# Patient Record
Sex: Female | Born: 1950
Health system: Southern US, Community
[De-identification: ages and names within clinical notes are randomized; demographics above are authoritative.]

## PROBLEM LIST (undated history)

## (undated) DIAGNOSIS — K149 Disease of tongue, unspecified: Secondary | ICD-10-CM

## (undated) DIAGNOSIS — K228 Other specified diseases of esophagus: Secondary | ICD-10-CM

## (undated) DIAGNOSIS — E278 Other specified disorders of adrenal gland: Secondary | ICD-10-CM

## (undated) DIAGNOSIS — E1140 Type 2 diabetes mellitus with diabetic neuropathy, unspecified: Secondary | ICD-10-CM

## (undated) DIAGNOSIS — M25512 Pain in left shoulder: Secondary | ICD-10-CM

## (undated) DIAGNOSIS — K2289 Other specified disease of esophagus: Secondary | ICD-10-CM

## (undated) DIAGNOSIS — F32A Depression, unspecified: Secondary | ICD-10-CM

## (undated) DIAGNOSIS — F329 Major depressive disorder, single episode, unspecified: Secondary | ICD-10-CM

## (undated) DIAGNOSIS — E785 Hyperlipidemia, unspecified: Secondary | ICD-10-CM

## (undated) DIAGNOSIS — I10 Essential (primary) hypertension: Secondary | ICD-10-CM

## (undated) DIAGNOSIS — R011 Cardiac murmur, unspecified: Secondary | ICD-10-CM

## (undated) DIAGNOSIS — K859 Acute pancreatitis without necrosis or infection, unspecified: Secondary | ICD-10-CM

## (undated) HISTORY — DX: Acute pancreatitis without necrosis or infection, unspecified: K85.90

## (undated) HISTORY — PX: CHOLECYSTECTOMY: SHX55

## (undated) HISTORY — PX: TUBAL LIGATION: SHX77

## (undated) HISTORY — PX: COLONOSCOPY: SHX174

## (undated) HISTORY — PX: OTHER SURGICAL HISTORY: SHX169

---

## 2004-04-23 ENCOUNTER — Ambulatory Visit: Payer: Self-pay | Admitting: Internal Medicine

## 2004-09-07 ENCOUNTER — Emergency Department (HOSPITAL_COMMUNITY): Admission: EM | Admit: 2004-09-07 | Discharge: 2004-09-07 | Payer: Self-pay | Admitting: Emergency Medicine

## 2006-02-22 ENCOUNTER — Ambulatory Visit: Payer: Self-pay

## 2006-04-21 ENCOUNTER — Encounter (HOSPITAL_COMMUNITY): Admission: RE | Admit: 2006-04-21 | Discharge: 2006-05-21 | Payer: Self-pay | Admitting: Unknown Physician Specialty

## 2006-04-29 ENCOUNTER — Ambulatory Visit: Payer: Self-pay | Admitting: Gastroenterology

## 2007-05-24 ENCOUNTER — Ambulatory Visit: Payer: Self-pay

## 2008-05-31 ENCOUNTER — Ambulatory Visit: Payer: Self-pay | Admitting: Obstetrics and Gynecology

## 2010-01-19 DIAGNOSIS — E278 Other specified disorders of adrenal gland: Secondary | ICD-10-CM

## 2010-01-19 HISTORY — DX: Other specified disorders of adrenal gland: E27.8

## 2010-09-27 ENCOUNTER — Emergency Department (HOSPITAL_COMMUNITY): Payer: BC Managed Care – PPO

## 2010-09-27 ENCOUNTER — Emergency Department (HOSPITAL_COMMUNITY)
Admission: EM | Admit: 2010-09-27 | Discharge: 2010-09-27 | Disposition: A | Discharge status: Home / Self Care | Payer: BC Managed Care – PPO | Attending: Emergency Medicine | Admitting: Emergency Medicine

## 2010-09-27 DIAGNOSIS — R1032 Left lower quadrant pain: Secondary | ICD-10-CM | POA: Insufficient documentation

## 2010-09-27 DIAGNOSIS — M545 Low back pain, unspecified: Secondary | ICD-10-CM | POA: Insufficient documentation

## 2010-09-27 DIAGNOSIS — R7402 Elevation of levels of lactic acid dehydrogenase (LDH): Secondary | ICD-10-CM | POA: Insufficient documentation

## 2010-09-27 DIAGNOSIS — E278 Other specified disorders of adrenal gland: Secondary | ICD-10-CM

## 2010-09-27 DIAGNOSIS — E279 Disorder of adrenal gland, unspecified: Secondary | ICD-10-CM | POA: Insufficient documentation

## 2010-09-27 DIAGNOSIS — R748 Abnormal levels of other serum enzymes: Secondary | ICD-10-CM

## 2010-09-27 DIAGNOSIS — E119 Type 2 diabetes mellitus without complications: Secondary | ICD-10-CM | POA: Insufficient documentation

## 2010-09-27 DIAGNOSIS — R7401 Elevation of levels of liver transaminase levels: Secondary | ICD-10-CM | POA: Insufficient documentation

## 2010-09-27 DIAGNOSIS — R1031 Right lower quadrant pain: Secondary | ICD-10-CM | POA: Insufficient documentation

## 2010-09-27 DIAGNOSIS — H538 Other visual disturbances: Secondary | ICD-10-CM | POA: Insufficient documentation

## 2010-09-27 LAB — DIFFERENTIAL
Basophils Absolute: 0 10*3/uL (ref 0.0–0.1)
Eosinophils Absolute: 0.1 10*3/uL (ref 0.0–0.7)
Eosinophils Relative: 2 % (ref 0–5)
Lymphocytes Relative: 47 % — ABNORMAL HIGH (ref 12–46)
Neutrophils Relative %: 46 % (ref 43–77)

## 2010-09-27 LAB — URINALYSIS, ROUTINE W REFLEX MICROSCOPIC
Bilirubin Urine: NEGATIVE
Glucose, UA: NEGATIVE mg/dL
Hgb urine dipstick: NEGATIVE
Nitrite: NEGATIVE
Specific Gravity, Urine: >1.03 — ABNORMAL HIGH (ref 1.005–1.030)
pH: 5.5 (ref 5.0–8.0)

## 2010-09-27 LAB — COMPREHENSIVE METABOLIC PANEL
ALT: 77 U/L — ABNORMAL HIGH (ref 0–35)
AST: 47 U/L — ABNORMAL HIGH (ref 0–37)
Albumin: 3.6 g/dL (ref 3.5–5.2)
Calcium: 10 mg/dL (ref 8.4–10.5)
Sodium: 134 mEq/L — ABNORMAL LOW (ref 135–145)
Total Protein: 6.8 g/dL (ref 6.0–8.3)

## 2010-09-27 LAB — CBC
MCH: 29.3 pg (ref 26.0–34.0)
MCV: 86.3 fL (ref 78.0–100.0)
Platelets: 323 10*3/uL (ref 150–400)
RDW: 12.5 % (ref 11.5–15.5)
WBC: 5.4 10*3/uL (ref 4.0–10.5)

## 2010-09-27 LAB — GLUCOSE, CAPILLARY

## 2010-09-27 MED ORDER — SODIUM CHLORIDE 0.9 % IV BOLUS (SEPSIS)
1000.0000 mL | Freq: Once | INTRAVENOUS | Status: AC
  Administered 2010-09-27: 1000 mL via INTRAVENOUS

## 2010-09-27 MED ORDER — ONDANSETRON HCL 4 MG PO TABS: 4.0000 mg | ORAL_TABLET | Freq: Four times a day (QID) | ORAL | Status: AC

## 2010-09-27 MED ORDER — SODIUM CHLORIDE 0.9 % IV SOLN: INTRAVENOUS | Status: DC

## 2010-09-27 MED ORDER — HYDROCODONE-ACETAMINOPHEN 5-325 MG PO TABS: 1.0000 | ORAL_TABLET | ORAL | Status: AC | PRN

## 2010-09-27 MED ORDER — HYDROMORPHONE HCL 1 MG/ML IJ SOLN
1.0000 mg | Freq: Once | INTRAMUSCULAR | Status: AC
  Administered 2010-09-27: 1 mg via INTRAVENOUS
  Filled 2010-09-27: qty 1

## 2010-09-27 MED ORDER — ONDANSETRON HCL 4 MG/2ML IJ SOLN
4.0000 mg | Freq: Once | INTRAMUSCULAR | Status: AC
  Administered 2010-09-27: 4 mg via INTRAVENOUS
  Filled 2010-09-27: qty 2

## 2010-09-27 NOTE — ED Notes (Signed)
Pt reports lower back and lower abd pain since yesterday.  Denies any n/v/d.  Pt says stools have been runny since taking metformin.  Denies any pain or burning with urination.  Also c/ o blurred vision since last Wednesday.  Reports pcp changed some of her diabetic medication because hgb a1c was elevated.

## 2010-09-27 NOTE — ED Notes (Signed)
Pt reports lower back pain, blurry vision, and lower abd pain.  Pt denies any n/v/d. Pt reports that the last time she went to her pcp, he told her that her A!C was elevated.  Pt feels like this is why she is experiencing these symptoms.

## 2010-09-27 NOTE — ED Provider Notes (Signed)
Scribed for Destiny Hutching, MD, the patient was seen in room APA01/APA01 . This chart was scribed by Ellie Lunch. This patient's care was started at 5:32 PM.   CSN: [id number] Arrival date & time: 09/27/2010  5:13 PM  Chief Complaint  Patient presents with  . Back Pain  . Abdominal Pain   HPI Destiny Hurley is a 60 y.o. female with a history of diabetes presents to the Emergency Department complaining of lower abdominal pain radiating to the lower back starting yesterday. Pain is described as "heavy" and like a cramp. Pt denies n/v/d, Pt states at her last PCP visit her AC1 was 9.2 and she was switched to new medicines. She believes her abdominal pain is related to this. Additionally PT complains of blurry vision.   Pt was seen at 17:40   Past Medical History  Diagnosis Date  . Diabetes mellitus     Past Surgical History  Procedure Date  . Cholecystectomy    MEDICATIONS:  Previous Medications   No medications on file     ALLERGIES:  Allergies as of 09/27/2010  . (No Known Allergies)    No family history on file.  History  Substance Use Topics  . Smoking status: Never Smoker   . Smokeless tobacco: Not on file  . Alcohol Use: No    Review of Systems  Eyes: Positive for visual disturbance (blurry vision).  Gastrointestinal: Positive for abdominal pain (lower). Negative for nausea, vomiting and diarrhea.  Musculoskeletal: Positive for back pain (lower).  All other systems reviewed and are negative.    Physical Exam  BP 135/78  Pulse 69  Temp(Src) 98.3 F (36.8 C) (Oral)  Resp 18  Ht 5\' 5"  (1.651 m)  Wt 217 lb (98.431 kg)  BMI 36.11 kg/m2  SpO2 99%  Physical Exam  Nursing note and vitals reviewed. Constitutional: She is oriented to person, place, and time. She appears well-developed and well-nourished.       Appears Obese  HENT:  Head: Normocephalic and atraumatic.  Eyes: Conjunctivae and EOM are normal. Pupils are equal, round, and reactive to light.  Neck:  Neck supple.  Cardiovascular: Normal rate and regular rhythm.   Pulmonary/Chest: Effort normal and breath sounds normal.  Abdominal: Soft. There is tenderness.        Minimal tenderness RLQ, LLQ.   Musculoskeletal: Normal range of motion. She exhibits tenderness.       Mininal tenderness lower back.   Neurological: She is alert and oriented to person, place, and time.  Skin: Skin is warm and dry.  Psychiatric: She has a normal mood and affect.    Procedures OTHER DATA REVIEWED: Nursing notes, vital signs, and past medical records reviewed.  DIAGNOSTIC STUDIES: Oxygen Saturation is 99% on room air, normal by my interpretation.    LABS / RADIOLOGY:  Results for orders placed during the hospital encounter of 09/27/10  CBC      Component Value Range   WBC 5.4  4.0 - 10.5 (K/uL)   RBC 4.61  3.87 - 5.11 (MIL/uL)   Hemoglobin 13.5  12.0 - 15.0 (g/dL)   HCT 56.2  13.0 - 86.5 (%)   MCV 86.3  78.0 - 100.0 (fL)   MCH 29.3  26.0 - 34.0 (pg)   MCHC 33.9  30.0 - 36.0 (g/dL)   RDW 78.4  69.6 - 29.5 (%)   Platelets 323  150 - 400 (K/uL)  DIFFERENTIAL      Component Value Range   Neutrophils Relative  46  43 - 77 (%)   Neutro Abs 2.5  1.7 - 7.7 (K/uL)   Lymphocytes Relative 47 (*) 12 - 46 (%)   Lymphs Abs 2.5  0.7 - 4.0 (K/uL)   Monocytes Relative 5  3 - 12 (%)   Monocytes Absolute 0.3  0.1 - 1.0 (K/uL)   Eosinophils Relative 2  0 - 5 (%)   Eosinophils Absolute 0.1  0.0 - 0.7 (K/uL)   Basophils Relative 0  0 - 1 (%)   Basophils Absolute 0.0  0.0 - 0.1 (K/uL)  COMPREHENSIVE METABOLIC PANEL      Component Value Range   Sodium 134 (*) 135 - 145 (mEq/L)   Potassium 4.0  3.5 - 5.1 (mEq/L)   Chloride 100  96 - 112 (mEq/L)   CO2 26  19 - 32 (mEq/L)   Glucose, Bld 203 (*) 70 - 99 (mg/dL)   BUN 11  6 - 23 (mg/dL)   Creatinine, Ser 1.61  0.50 - 1.10 (mg/dL)   Calcium 09.6  8.4 - 10.5 (mg/dL)   Total Protein 6.8  6.0 - 8.3 (g/dL)   Albumin 3.6  3.5 - 5.2 (g/dL)   AST 47 (*) 0 - 37 (U/L)    ALT 77 (*) 0 - 35 (U/L)   Alkaline Phosphatase 126 (*) 39 - 117 (U/L)   Total Bilirubin 0.2 (*) 0.3 - 1.2 (mg/dL)   GFR calc non Af Amer >60  >60 (mL/min)   GFR calc Af Amer >60  >60 (mL/min)  URINALYSIS, ROUTINE W REFLEX MICROSCOPIC      Component Value Range   Color, Urine YELLOW  YELLOW    Appearance CLEAR  CLEAR    Specific Gravity, Urine >1.030 (*) 1.005 - 1.030    pH 5.5  5.0 - 8.0    Glucose, UA NEGATIVE  NEGATIVE (mg/dL)   Hgb urine dipstick NEGATIVE  NEGATIVE    Bilirubin Urine NEGATIVE  NEGATIVE    Ketones, ur NEGATIVE  NEGATIVE (mg/dL)   Protein, ur NEGATIVE  NEGATIVE (mg/dL)   Urobilinogen, UA 0.2  0.0 - 1.0 (mg/dL)   Nitrite NEGATIVE  NEGATIVE    Leukocytes, UA NEGATIVE  NEGATIVE   GLUCOSE, CAPILLARY      Component Value Range   Glucose-Capillary 200 (*) 70 - 99 (mg/dL)   Comment 1 Notify RN     Ct Abdomen Pelvis W Contrast  09/27/2010  *RADIOLOGY REPORT*  Clinical Data: Right-sided cramping and abdominal pain.  Post cholecystectomy.  CT ABDOMEN AND PELVIS WITH CONTRAST  Technique:  Multidetector CT imaging of the abdomen and pelvis was performed following the standard protocol during bolus administration of intravenous contrast.  Contrast:  100 ml Omnipaque-300.  Comparison: None.  Findings: No extraluminal bowel inflammatory process. Specifically, no inflammation surrounds the appendix.  No free fluid or free intraperitoneal air.  Lung bases clear.  Diffuse fatty infiltration of enlarged liver which spans over 22.4 cm without focal hepatic lesion.  No hydronephrosis or focal renal mass.  Parapelvic cyst noted on the left.  No right adrenal lesion, splenic lesion or pancreatic lesion.  2 cm left adrenal lesion.  This cannot be confirmed as an adenoma. One may consider follow-up CT or MR with attention to the adrenal gland in 12 months.  Fatty containing peri umbilical hernia.  No abdominal aortic aneurysm.  Calcified uterine fibroids displacing adjacent structures. Noncontrast  filled views of the urinary bladder unremarkable.  No pelvic or abdominal adenopathy.  No bony destructive lesion.  IMPRESSION: Enlarged liver (spanning over 22.4 cm) which is diffusely fatty infiltrated.  2 cm left adrenal lesion.  Follow-up CT/MR with attention to the adrenal gland in 12 months may be considered for further delineation.  No bowel inflammatory process detected.  Uterine calcified fibroids.  Original Report Authenticated By: Fuller Canada, [name]   ED COURSE / COORDINATION OF CARE:  MDM:  Uncertain etiology.  Will order Labs, CT scan, urinalysis and pain management.  Discussed lab results with PT. Discussed fatty liver, liver chemicals elevated and enlarged adrenal glands. Recommend another scan in one year.    IMPRESSION: Diagnoses that have been ruled out:  Diagnoses that are still under consideration:  Final diagnoses:   MEDICATIONS GIVEN IN THE E.D.  Medications  ondansetron (ZOFRAN) injection 4 mg   HYDROmorphone (DILAUDID) injection 1 mg   sodium chloride 0.9 % bolus 1,000 mL   0.9 %  sodium chloride infusion     DISCHARGE MEDICATIONS: New Prescriptions   No medications on file    SCRIBE ATTESTATION: I personally performed the services described in this documentation, which was scribed in my presence. The recorded information has been reviewed and considered. Destiny Hutching, MD Recheck at 2300. Patient feeling much better. No acute abdomen.  Discussed elevated liver enzymes. Also discussed left adrenal mass. A she understands she needs repeat CT scan in one year.       Destiny Hutching, MD 09/27/10 [phone]

## 2010-09-27 NOTE — ED Notes (Signed)
Pt back from ct

## 2010-09-27 NOTE — ED Notes (Signed)
Pt asleep.

## 2010-10-08 ENCOUNTER — Encounter (HOSPITAL_COMMUNITY): Payer: Self-pay | Admitting: *Deleted

## 2010-10-08 ENCOUNTER — Emergency Department (HOSPITAL_COMMUNITY): Payer: BC Managed Care – PPO

## 2010-10-08 ENCOUNTER — Emergency Department (HOSPITAL_COMMUNITY)
Admission: EM | Admit: 2010-10-08 | Discharge: 2010-10-08 | Disposition: A | Discharge status: Home / Self Care | Payer: BC Managed Care – PPO | Attending: Emergency Medicine | Admitting: Emergency Medicine

## 2010-10-08 DIAGNOSIS — S99929A Unspecified injury of unspecified foot, initial encounter: Secondary | ICD-10-CM | POA: Insufficient documentation

## 2010-10-08 DIAGNOSIS — E119 Type 2 diabetes mellitus without complications: Secondary | ICD-10-CM | POA: Insufficient documentation

## 2010-10-08 DIAGNOSIS — Y92009 Unspecified place in unspecified non-institutional (private) residence as the place of occurrence of the external cause: Secondary | ICD-10-CM | POA: Insufficient documentation

## 2010-10-08 DIAGNOSIS — IMO0002 Reserved for concepts with insufficient information to code with codable children: Secondary | ICD-10-CM | POA: Insufficient documentation

## 2010-10-08 DIAGNOSIS — S8990XA Unspecified injury of unspecified lower leg, initial encounter: Secondary | ICD-10-CM | POA: Insufficient documentation

## 2010-10-08 DIAGNOSIS — S92309A Fracture of unspecified metatarsal bone(s), unspecified foot, initial encounter for closed fracture: Secondary | ICD-10-CM

## 2010-10-08 MED ORDER — HYDROCODONE-ACETAMINOPHEN 5-325 MG PO TABS
1.0000 | ORAL_TABLET | Freq: Once | ORAL | Status: AC
  Administered 2010-10-08: 1 via ORAL
  Filled 2010-10-08: qty 1

## 2010-10-08 MED ORDER — HYDROCODONE-ACETAMINOPHEN 5-325 MG PO TABS: ORAL_TABLET | ORAL | Status: AC

## 2010-10-08 NOTE — ED Notes (Signed)
Cam Walker applied to right foot.

## 2010-10-08 NOTE — ED Notes (Signed)
Pt a/ox4. resp even and unlabored. NAD at this time. D/C instructions reviewed with pt. Pt verbalized understanding. Pt ambulated with steady gate to lobby to wait for daughter.

## 2010-10-08 NOTE — ED Notes (Signed)
Right foot pain, dropped a lawnmower on her foot

## 2010-10-08 NOTE — ED Provider Notes (Signed)
History     CSN: [id number] Arrival date & time: 10/08/2010  6:25 PM   Chief Complaint  Patient presents with  . Leg Injury     (Include location/radiation/quality/duration/timing/severity/associated sxs/prior treatment) Patient is a 60 y.o. female presenting with foot injury. The history is provided by the patient.  Foot Injury  The incident occurred 6 to 12 hours ago. The incident occurred at home. The injury mechanism was a direct blow. The pain is present in the right foot. The quality of the pain is described as aching and throbbing. The pain is moderate. The pain has been constant since onset. Associated symptoms include inability to bear weight. Pertinent negatives include no numbness, no loss of motion, no muscle weakness, no loss of sensation and no tingling. She reports no foreign bodies present. The symptoms are aggravated by activity, bearing weight and palpation. She has tried nothing for the symptoms. The treatment provided no relief.     Past Medical History  Diagnosis Date  . Diabetes mellitus      Past Surgical History  Procedure Date  . Cholecystectomy     History reviewed. No pertinent family history.  History  Substance Use Topics  . Smoking status: Never Smoker   . Smokeless tobacco: Not on file  . Alcohol Use: No    OB History    Grav Para Term Preterm Abortions TAB SAB Ect Mult Living                  Review of Systems  Musculoskeletal: Positive for arthralgias. Negative for back pain and joint swelling.  Skin: Negative.   Neurological: Negative for tingling, weakness and numbness.  Hematological: Does not bruise/bleed easily.  All other systems reviewed and are negative.    Allergies  Review of patient's allergies indicates no known allergies.  Home Medications   Current Outpatient Rx  Name Route Sig Dispense Refill  . GLIMEPIRIDE 4 MG PO TABS Oral Take 4 mg by mouth 2 (two) times daily.      Marland Kitchen LINAGLIPTIN 5 MG PO TABS Oral Take 5  mg by mouth daily.      Marland Kitchen LISINOPRIL 10 MG PO TABS Oral Take 10 mg by mouth daily.      Marland Kitchen METFORMIN HCL 1000 MG PO TABS Oral Take 1,000 mg by mouth 2 (two) times daily with a meal.     . OMEPRAZOLE 20 MG PO CPDR Oral Take 20 mg by mouth daily as needed. For acid reflux    . GABAPENTIN 100 MG PO CAPS Oral Take 100 mg by mouth 2 (two) times daily.      Marland Kitchen HYDROCODONE-ACETAMINOPHEN 5-325 MG PO TABS Oral Take 1-2 tablets by mouth every 4 (four) hours as needed for pain. 20 tablet 0    Physical Exam    BP 144/106  Pulse 78  Temp(Src) 98.8 F (37.1 C) (Oral)  Resp 16  Ht 5\' 5"  (1.651 m)  Wt 215 lb (97.523 kg)  BMI 35.78 kg/m2  SpO2 100%  Physical Exam  Constitutional: She is oriented to person, place, and time. She appears well-developed and well-nourished. No distress.  HENT:  Head: Normocephalic and atraumatic.  Neck: Normal range of motion. Neck supple.  Cardiovascular: Normal rate, regular rhythm and normal heart sounds.   Pulmonary/Chest: Effort normal and breath sounds normal.  Musculoskeletal: She exhibits tenderness. She exhibits no edema.       Right ankle: Normal.       Right foot: She exhibits tenderness and  bony tenderness. She exhibits normal range of motion, no swelling, normal capillary refill, no crepitus, no deformity and no laceration.       Feet:  Neurological: She is alert and oriented to person, place, and time. She has normal reflexes. A cranial nerve deficit is present. She exhibits normal muscle tone. Coordination normal.  Skin: Skin is warm and dry.    ED Course  ORTHOPEDIC INJURY TREATMENT Date/Time: 10/08/2010 7:52 PM Performed by: Trisha Mangle, Kayline Sheer L. Authorized by: Geoffery Lyons Consent: Verbal consent obtained. Written consent not obtained. Risks and benefits: risks, benefits and alternatives were discussed Consent given by: patient Patient understanding: patient states understanding of the procedure being performed Patient consent: the patient's  understanding of the procedure matches consent given Procedure consent: procedure consent matches procedure scheduled Imaging studies: imaging studies available Patient identity confirmed: verbally with patient Time out: Immediately prior to procedure a "time out" was called to verify the correct patient, procedure, equipment, support staff and site/side marked as required. Injury location: foot Injury type: fracture Fracture type: third metatarsal Pre-procedure neurovascular assessment: neurovascularly intact Pre-procedure distal perfusion: normal Pre-procedure neurological function: normal Pre-procedure range of motion: normal Local anesthesia used: no Patient sedated: no Manipulation performed: no Immobilization: cam walker. Post-procedure neurovascular assessment: post-procedure neurovascularly intact Post-procedure distal perfusion: normal Post-procedure neurological function: normal Post-procedure range of motion: normal Patient tolerance: Patient tolerated the procedure well with no immediate complications.      Dg Foot Complete Right  10/08/2010  *RADIOLOGY REPORT*  Clinical Data: Right foot pain after crush injury.  RIGHT FOOT COMPLETE - 3+ VIEW  Comparison: None.  Findings: I think there is a nondisplaced fracture of the third metatarsal neck although this is not a definite finding.  Otherwise no acute fracture is evident.  There is no subluxation or dislocation.  IMPRESSION: Question nondisplaced fracture through the neck of the middle metatarsal.  Original Report Authenticated By: ERIC A. MANSELL, [name]         MDM    8:12 PM patient has tenderness to the distal portion of the third metatarsal of the right foot.  Imaging suggest a non-displaced fx at this site.  I have placed patient in a cam walker and she agrees to close orthopedic follow-up.  CR<2 sec, sensation intact.  Dp pulse is strong and equal bilaterally.      Destry Dauber L. [place], [place] 10/12/10 [phone]

## 2010-10-13 ENCOUNTER — Encounter: Payer: Self-pay | Admitting: Orthopedic Surgery

## 2010-10-13 ENCOUNTER — Ambulatory Visit (INDEPENDENT_AMBULATORY_CARE_PROVIDER_SITE_OTHER): Payer: BC Managed Care – PPO | Admitting: Orthopedic Surgery

## 2010-10-13 VITALS — BP 118/82 | Ht 65.0 in | Wt 215.0 lb

## 2010-10-13 DIAGNOSIS — S92309A Fracture of unspecified metatarsal bone(s), unspecified foot, initial encounter for closed fracture: Secondary | ICD-10-CM

## 2010-10-13 NOTE — Patient Instructions (Signed)
Wear hard sole shoe x 6 weeks

## 2010-10-13 NOTE — Progress Notes (Signed)
New patient.  Emergency room  This 60 year old diabetic patient fell on September 19 when she slipped off a platform and twisted her foot and ankle.  She was seen in the emergency room on the same day and x-rays show a third metatarsal fracture nondisplaced area she complains of mild discomfort which seems to come and go and is worse with walking better with rest.  Minimal pain at this time.  Review of systems  Review of systems blurred vision diarrhea frequency denies numbness or tingling.  The x-ray was done at the hospital and has been reviewed with the report.  Past Medical History  Diagnosis Date  . Diabetes mellitus     Past Surgical History  Procedure Date  . Cholecystectomy     .Physical Exam(12) GENERAL: normal development , Mesomorphic body habitus  CDV: pulses are normal   Skin: normal  Lymph: nodes were not palpable/normal  Psychiatric: awake, alert and oriented  Neuro: normal sensation  MSK She is ambulating with a Cam Walker but says it's really heavy 1Tenderness at the third metatarsal 2 No swelling of the foot 3 Muscle tone normal 4 Joints stable 5 Range of motion is normal  Assessment: Fractured third metatarsal    Plan: Switch over to a postop hard sole shoe, 6 weeks, x-ray in 6

## 2010-10-15 ENCOUNTER — Telehealth: Payer: Self-pay | Admitting: Radiology

## 2010-10-15 NOTE — Telephone Encounter (Signed)
Will do, thank you

## 2010-10-15 NOTE — Telephone Encounter (Signed)
Patient called about her FMLA papers and said not to let [facility] fax or mail them, she needed to pick them up per her work place instructed her to. If you need to talk with her she will be home after lunch.

## 2010-10-16 ENCOUNTER — Ambulatory Visit: Payer: BC Managed Care – PPO | Admitting: Orthopedic Surgery

## 2010-10-17 NOTE — ED Provider Notes (Signed)
Medical screening examination/treatment/procedure(s) were performed by non-physician practitioner and as supervising physician I was immediately available for consultation/collaboration.   Huda Petrey, MD 10/17/10 0510 

## 2010-11-04 ENCOUNTER — Encounter: Payer: Self-pay | Admitting: Orthopedic Surgery

## 2010-11-25 ENCOUNTER — Encounter: Payer: Self-pay | Admitting: Orthopedic Surgery

## 2010-11-25 ENCOUNTER — Ambulatory Visit (INDEPENDENT_AMBULATORY_CARE_PROVIDER_SITE_OTHER): Payer: BC Managed Care – PPO | Admitting: Orthopedic Surgery

## 2010-11-25 ENCOUNTER — Ambulatory Visit: Payer: BC Managed Care – PPO | Admitting: Orthopedic Surgery

## 2010-11-25 VITALS — BP 102/60 | Ht 65.0 in | Wt 222.0 lb

## 2010-11-25 DIAGNOSIS — S92909A Unspecified fracture of unspecified foot, initial encounter for closed fracture: Secondary | ICD-10-CM

## 2010-11-25 MED ORDER — HYDROCODONE-ACETAMINOPHEN 5-325 MG PO TABS
1.0000 | ORAL_TABLET | Freq: Four times a day (QID) | ORAL | Status: AC | PRN
Start: 2010-11-25 — End: 2010-12-05

## 2010-11-25 NOTE — Progress Notes (Signed)
Follow up visit   This 60 year old diabetic patient fell on September 19 when she slipped off a platform and twisted her foot and ankle. She was seen in the emergency room on the same day and x-rays show a third metatarsal fracture nondisplaced area she complains of mild discomfort which seems to come and go and is worse with walking better with rest.   Fracture care.  Metatarsal fracture. #3 LEFT foot.  Currently, and postop hard sole shoe.  Still has some tenderness at the fracture site.  X-ray show fracture to be healed.  Recommended wearing the shoe for 3 more days to make sure she is ready to go back to work and then when she is seeking to return to work note.  X-ray report 3 views, LEFT foot.  3rd metatarsal fracture at the metaphyseal area distally.  The fracture looks healed and normal alignment.  Healed 3rd metatarsal fracture

## 2010-11-25 NOTE — Patient Instructions (Signed)
Remove shoe   If pain reapply x 2 weeks

## 2011-06-08 ENCOUNTER — Emergency Department (HOSPITAL_COMMUNITY)
Admission: EM | Admit: 2011-06-08 | Discharge: 2011-06-08 | Disposition: A | Discharge status: Home / Self Care | Payer: Self-pay | Source: Home / Self Care | Attending: Family Medicine | Admitting: Family Medicine

## 2011-06-08 ENCOUNTER — Encounter (HOSPITAL_COMMUNITY): Payer: Self-pay | Admitting: Emergency Medicine

## 2011-06-08 DIAGNOSIS — S8010XA Contusion of unspecified lower leg, initial encounter: Secondary | ICD-10-CM

## 2011-06-08 DIAGNOSIS — S8012XA Contusion of left lower leg, initial encounter: Secondary | ICD-10-CM
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
## 2011-06-08 NOTE — Discharge Instructions (Signed)
Heat to leg as needed for soreness, ok to work, advil or tylenol for soreness.

## 2011-07-14 ENCOUNTER — Ambulatory Visit: Payer: Self-pay | Admitting: Internal Medicine

## 2011-08-03 ENCOUNTER — Ambulatory Visit: Payer: Self-pay | Admitting: Internal Medicine

## 2012-02-17 ENCOUNTER — Ambulatory Visit: Payer: Self-pay | Admitting: Gastroenterology

## 2014-01-03 ENCOUNTER — Ambulatory Visit: Payer: Self-pay | Admitting: Internal Medicine

## 2014-06-20 DIAGNOSIS — E1140 Type 2 diabetes mellitus with diabetic neuropathy, unspecified: Secondary | ICD-10-CM | POA: Insufficient documentation

## 2014-12-25 ENCOUNTER — Emergency Department (HOSPITAL_COMMUNITY): Payer: BLUE CROSS/BLUE SHIELD

## 2014-12-25 ENCOUNTER — Encounter (HOSPITAL_COMMUNITY): Payer: Self-pay | Admitting: Emergency Medicine

## 2014-12-25 ENCOUNTER — Emergency Department (HOSPITAL_COMMUNITY)
Admission: EM | Admit: 2014-12-25 | Discharge: 2014-12-25 | Disposition: A | Discharge status: Home / Self Care | Payer: BLUE CROSS/BLUE SHIELD | Attending: Emergency Medicine | Admitting: Emergency Medicine

## 2014-12-25 DIAGNOSIS — X501XXA Overexertion from prolonged static or awkward postures, initial encounter: Secondary | ICD-10-CM | POA: Insufficient documentation

## 2014-12-25 DIAGNOSIS — Y9301 Activity, walking, marching and hiking: Secondary | ICD-10-CM | POA: Insufficient documentation

## 2014-12-25 DIAGNOSIS — Y998 Other external cause status: Secondary | ICD-10-CM | POA: Diagnosis not present

## 2014-12-25 DIAGNOSIS — Z7984 Long term (current) use of oral hypoglycemic drugs: Secondary | ICD-10-CM | POA: Diagnosis not present

## 2014-12-25 DIAGNOSIS — E119 Type 2 diabetes mellitus without complications: Secondary | ICD-10-CM | POA: Diagnosis not present

## 2014-12-25 DIAGNOSIS — Y9289 Other specified places as the place of occurrence of the external cause: Secondary | ICD-10-CM | POA: Diagnosis not present

## 2014-12-25 DIAGNOSIS — S93401A Sprain of unspecified ligament of right ankle, initial encounter: Secondary | ICD-10-CM | POA: Diagnosis not present

## 2014-12-25 DIAGNOSIS — E663 Overweight: Secondary | ICD-10-CM | POA: Diagnosis not present

## 2014-12-25 DIAGNOSIS — Z79899 Other long term (current) drug therapy: Secondary | ICD-10-CM | POA: Diagnosis not present

## 2014-12-25 DIAGNOSIS — S99921A Unspecified injury of right foot, initial encounter: Secondary | ICD-10-CM | POA: Diagnosis present

## 2014-12-25 MED ORDER — HYDROCODONE-ACETAMINOPHEN 5-325 MG PO TABS: 1.0000 | ORAL_TABLET | Freq: Four times a day (QID) | ORAL | Status: DC | PRN

## 2014-12-25 MED ORDER — HYDROCODONE-ACETAMINOPHEN 5-325 MG PO TABS
1.0000 | ORAL_TABLET | Freq: Once | ORAL | Status: DC
Start: 2014-12-25 — End: 2014-12-25

## 2014-12-25 MED ORDER — ACETAMINOPHEN 325 MG PO TABS
650.0000 mg | ORAL_TABLET | Freq: Once | ORAL | Status: AC
  Administered 2014-12-25: 650 mg via ORAL
  Filled 2014-12-25: qty 2

## 2014-12-25 NOTE — ED Provider Notes (Signed)
CSN: [id number]     Arrival date & time 12/25/14  0351 History   First MD Initiated Contact with Patient 12/25/14 [phone]     Chief Complaint  Patient presents with  . Foot Injury     (Consider location/radiation/quality/duration/timing/severity/associated sxs/prior Treatment) HPI  This is a 64 year old female with a history of diabetes who presents with right foot pain. Patient reports that she was walking to her mailbox earlier today when she stepped off a curb and rolled her right foot. She reports pain over the midfoot. Currently 10 out of 10. She has not taken anything for pain. Reports tingling in her fourth and fifth toes. Has been ambulatory. Denies other injury.  Past Medical History  Diagnosis Date  . Diabetes mellitus    Past Surgical History  Procedure Laterality Date  . Cholecystectomy     Family History  Problem Relation Age of Onset  . Heart disease    . Arthritis    . Lung disease    . Cancer    . Kidney disease    . Diabetes     Social History  Substance Use Topics  . Smoking status: Never Smoker   . Smokeless tobacco: Current User    Types: Chew  . Alcohol Use: No   OB History    No data available     Review of Systems  Musculoskeletal:       Right foot pain  Skin: Negative for wound.  All other systems reviewed and are negative.     Allergies  Review of patient's allergies indicates no known allergies.  Home Medications   Prior to Admission medications   Medication Sig Start Date End Date Taking? Authorizing Provider  canagliflozin (INVOKANA) 300 MG TABS tablet Take 300 mg by mouth daily. 12/11/14  Yes Historical Provider, MD  gabapentin (NEURONTIN) 100 MG capsule Take 100 mg by mouth 2 (two) times daily.     Yes Historical Provider, MD  glimepiride (AMARYL) 4 MG tablet Take 4 mg by mouth 2 (two) times daily.     Yes Historical Provider, MD  linagliptin (TRADJENTA) 5 MG TABS tablet Take 5 mg by mouth daily.     Yes Historical Provider, MD   lisinopril (PRINIVIL,ZESTRIL) 10 MG tablet Take 10 mg by mouth daily.     Yes Historical Provider, MD  lovastatin (MEVACOR) 20 MG tablet Take 20 mg by mouth daily. 10/10/14  Yes Historical Provider, MD  metFORMIN (GLUCOPHAGE) 1000 MG tablet Take 1,000 mg by mouth 2 (two) times daily with a meal.    Yes Historical Provider, MD  omeprazole (PRILOSEC) 20 MG capsule Take 20 mg by mouth daily as needed. For acid reflux   Yes Historical Provider, MD  HYDROcodone-acetaminophen (NORCO/VICODIN) 5-325 MG tablet Take 1-2 tablets by mouth every 6 (six) hours as needed for moderate pain. 12/25/14   Shon Baton, MD   BP 123/75 mmHg  Pulse 87  Temp(Src) 97.7 F (36.5 C) (Oral)  Resp 18  Ht  (1.651 m)  Wt 215 lb (97.523 kg)  BMI 35.78 kg/m2  SpO2 99% Physical Exam  Constitutional: She is oriented to person, place, and time. She appears well-developed and well-nourished.  Overweight  HENT:  Head: Normocephalic and atraumatic.  Cardiovascular: Normal rate and regular rhythm.   Pulmonary/Chest: Effort normal. No respiratory distress.  Musculoskeletal: She exhibits no edema.  No obvious deformities, no swelling noted, tenderness is just distal to the ankle but no midfoot tenderness, 2+ DP pulse, normal range  of motion of the toes, NV intact  Neurological: She is alert and oriented to person, place, and time.  Skin: Skin is warm and dry.  Psychiatric: She has a normal mood and affect.  Nursing note and vitals reviewed.   ED Course  Procedures (including critical care time) Labs Review Labs Reviewed - No data to display  Imaging Review Dg Ankle Complete Right  12/25/2014  CLINICAL DATA:  64 year old female with trauma to the right ankle. EXAM: RIGHT FOOT COMPLETE - 3+ VIEW; RIGHT ANKLE - COMPLETE 3+ VIEW COMPARISON:  None. FINDINGS: No acute fracture or dislocation. There is diffuse soft subcutaneous tissue stranding. No radiopaque foreign object identified. IMPRESSION: No fracture or  dislocation. Electronically Signed   By: Elgie CollardArash  Radparvar [name]   On: 12/25/2014 04:32   Dg Foot Complete Right  12/25/2014  CLINICAL DATA:  64 year old female with trauma to the right ankle. EXAM: RIGHT FOOT COMPLETE - 3+ VIEW; RIGHT ANKLE - COMPLETE 3+ VIEW COMPARISON:  None. FINDINGS: No acute fracture or dislocation. There is diffuse soft subcutaneous tissue stranding. No radiopaque foreign object identified. IMPRESSION: No fracture or dislocation. Electronically Signed   By: Elgie CollardArash  Radparvar [name]   On: 12/25/2014 04:32   I have personally reviewed and evaluated these images and lab results as part of my medical decision-making.   EKG Interpretation None      MDM   Final diagnoses:  Ankle sprain, right, initial encounter    Patient presents with right foot injury. Neurovascularly intact. No deformities. X-rays negative. Suspect sprain. Discussed with patient rest, ice, compression, and elevation. Pain medication at home.  After history, exam, and medical workup I feel the patient has been appropriately medically screened and is safe for discharge home. Pertinent diagnoses were discussed with the patient. Patient was given return precautions.     Shon Batonourtney F Rayssa Atha, MD 12/25/14 [phone]

## 2014-12-25 NOTE — ED Notes (Signed)
Pt c/o R foot pain, pt reports she twisted foot/ankle while walking to check her mail last night 2100. Pt has not taken taken any medication for injury

## 2014-12-25 NOTE — Discharge Instructions (Signed)
Ankle Sprain  An ankle sprain is an injury to the strong, fibrous tissues (ligaments) that hold the bones of your ankle joint together.   CAUSES  An ankle sprain is usually caused by a fall or by twisting your ankle. Ankle sprains most commonly occur when you step on the outer edge of your foot, and your ankle turns inward. People who participate in sports are more prone to these types of injuries.   SYMPTOMS    Pain in your ankle. The pain may be present at rest or only when you are trying to stand or walk.   Swelling.   Bruising. Bruising may develop immediately or within 1 to 2 days after your injury.   Difficulty standing or walking, particularly when turning corners or changing directions.  DIAGNOSIS   Your caregiver will ask you details about your injury and perform a physical exam of your ankle to determine if you have an ankle sprain. During the physical exam, your caregiver will press on and apply pressure to specific areas of your foot and ankle. Your caregiver will try to move your ankle in certain ways. An X-ray exam may be done to be sure a bone was not broken or a ligament did not separate from one of the bones in your ankle (avulsion fracture).   TREATMENT   Certain types of braces can help stabilize your ankle. Your caregiver can make a recommendation for this. Your caregiver may recommend the use of medicine for pain. If your sprain is severe, your caregiver may refer you to a surgeon who helps to restore function to parts of your skeletal system (orthopedist) or a physical therapist.  HOME CARE INSTRUCTIONS    Apply ice to your injury for 1-2 days or as directed by your caregiver. Applying ice helps to reduce inflammation and pain.    Put ice in a plastic bag.    Place a towel between your skin and the bag.    Leave the ice on for 15-20 minutes at a time, every 2 hours while you are awake.   Only take over-the-counter or prescription medicines for pain, discomfort, or fever as directed by  your caregiver.   Elevate your injured ankle above the level of your heart as much as possible for 2-3 days.   If your caregiver recommends crutches, use them as instructed. Gradually put weight on the affected ankle. Continue to use crutches or a cane until you can walk without feeling pain in your ankle.   If you have a plaster splint, wear the splint as directed by your caregiver. Do not rest it on anything harder than a pillow for the first 24 hours. Do not put weight on it. Do not get it wet. You may take it off to take a shower or bath.   You may have been given an elastic bandage to wear around your ankle to provide support. If the elastic bandage is too tight (you have numbness or tingling in your foot or your foot becomes cold and blue), adjust the bandage to make it comfortable.   If you have an air splint, you may blow more air into it or let air out to make it more comfortable. You may take your splint off at night and before taking a shower or bath. Wiggle your toes in the splint several times per day to decrease swelling.  SEEK MEDICAL CARE IF:    You have rapidly increasing bruising or swelling.   Your toes feel   extremely cold or you lose feeling in your foot.   Your pain is not relieved with medicine.  SEEK IMMEDIATE MEDICAL CARE IF:   Your toes are numb or blue.   You have severe pain that is increasing.  MAKE SURE YOU:    Understand these instructions.   Will watch your condition.   Will get help right away if you are not doing well or get worse.     This information is not intended to replace advice given to you by your health care provider. Make sure you discuss any questions you have with your health care provider.     Document Released: 01/05/2005 Document Revised: 01/26/2014 Document Reviewed: 01/17/2011  Elsevier Interactive Patient Education 2016 [employer]

## 2014-12-25 NOTE — ED Notes (Signed)
Pt drove her self tonight to the ED, order changed to Tylenol

## 2014-12-25 NOTE — ED Notes (Signed)
Ice wrap applied to foot/ankle, ice bag given to patient with instructions on RICE. Pt verbalized understanding not to drive or go to work while taking narcotics. Work not provided.

## 2015-06-16 ENCOUNTER — Ambulatory Visit (HOSPITAL_COMMUNITY)
Admission: EM | Admit: 2015-06-16 | Discharge: 2015-06-16 | Disposition: A | Discharge status: Home / Self Care | Payer: No Typology Code available for payment source | Attending: Emergency Medicine | Admitting: Emergency Medicine

## 2015-06-16 ENCOUNTER — Encounter (HOSPITAL_COMMUNITY): Payer: Self-pay | Admitting: *Deleted

## 2015-06-16 DIAGNOSIS — S161XXA Strain of muscle, fascia and tendon at neck level, initial encounter: Secondary | ICD-10-CM | POA: Diagnosis not present

## 2015-06-16 HISTORY — DX: Essential (primary) hypertension: I10

## 2015-06-16 HISTORY — DX: Hyperlipidemia, unspecified: E78.5

## 2015-06-16 MED ORDER — CYCLOBENZAPRINE HCL 5 MG PO TABS: 5.0000 mg | ORAL_TABLET | Freq: Three times a day (TID) | ORAL | Status: DC | PRN
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
## 2015-06-16 NOTE — Discharge Instructions (Signed)
You have strained the muscles in your neck. There is no sign of spinal injury. For today and tomorrow, apply ice for 10 minutes followed by heat for 10 minutes. Starting on Tuesday, you can just use heat. Take Tylenol or ibuprofen as needed for pain. Use the Flexeril at bedtime to help with muscle spasm. This medicine will make you sleepy. The worst should be over in the next 2 days, but this will likely take 1-2 weeks to fully resolve.
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
## 2015-10-07 ENCOUNTER — Other Ambulatory Visit: Payer: Self-pay | Admitting: Internal Medicine

## 2015-10-07 DIAGNOSIS — R748 Abnormal levels of other serum enzymes: Secondary | ICD-10-CM

## 2015-10-07 DIAGNOSIS — R1013 Epigastric pain: Secondary | ICD-10-CM

## 2015-10-14 ENCOUNTER — Ambulatory Visit
Admission: RE | Admit: 2015-10-14 | Discharge: 2015-10-14 | Disposition: A | Discharge status: Home / Self Care | Payer: BLUE CROSS/BLUE SHIELD | Source: Ambulatory Visit | Attending: Internal Medicine | Admitting: Internal Medicine

## 2015-10-14 DIAGNOSIS — K760 Fatty (change of) liver, not elsewhere classified: Secondary | ICD-10-CM | POA: Diagnosis not present

## 2015-10-14 DIAGNOSIS — Z9049 Acquired absence of other specified parts of digestive tract: Secondary | ICD-10-CM | POA: Diagnosis not present

## 2015-10-14 DIAGNOSIS — R748 Abnormal levels of other serum enzymes: Secondary | ICD-10-CM

## 2015-10-14 DIAGNOSIS — R1013 Epigastric pain: Secondary | ICD-10-CM

## 2016-01-14 ENCOUNTER — Other Ambulatory Visit: Payer: Self-pay | Admitting: Internal Medicine

## 2016-01-14 DIAGNOSIS — Z1231 Encounter for screening mammogram for malignant neoplasm of breast: Secondary | ICD-10-CM

## 2016-02-18 ENCOUNTER — Ambulatory Visit: Payer: BLUE CROSS/BLUE SHIELD | Attending: Internal Medicine

## 2016-05-11 ENCOUNTER — Ambulatory Visit
Admission: RE | Admit: 2016-05-11 | Discharge: 2016-05-11 | Disposition: A | Discharge status: Home / Self Care | Payer: BLUE CROSS/BLUE SHIELD | Source: Ambulatory Visit | Attending: Internal Medicine | Admitting: Internal Medicine

## 2016-05-11 DIAGNOSIS — Z1231 Encounter for screening mammogram for malignant neoplasm of breast: Secondary | ICD-10-CM | POA: Diagnosis not present

## 2016-11-06 LAB — HM DIABETES EYE EXAM

## 2016-11-27 ENCOUNTER — Encounter: Payer: Self-pay | Admitting: Podiatry

## 2016-11-27 ENCOUNTER — Ambulatory Visit (INDEPENDENT_AMBULATORY_CARE_PROVIDER_SITE_OTHER): Payer: BLUE CROSS/BLUE SHIELD | Admitting: Podiatry

## 2016-11-27 VITALS — BP 126/70 | HR 89 | Ht 65.0 in | Wt 193.0 lb

## 2016-11-27 DIAGNOSIS — G629 Polyneuropathy, unspecified: Secondary | ICD-10-CM | POA: Diagnosis not present

## 2016-11-27 DIAGNOSIS — E1165 Type 2 diabetes mellitus with hyperglycemia: Secondary | ICD-10-CM | POA: Diagnosis not present

## 2016-11-27 MED ORDER — GABAPENTIN 600 MG PO TABS: 600.0000 mg | ORAL_TABLET | Freq: Three times a day (TID) | ORAL | 2 refills | Status: DC

## 2016-11-27 NOTE — Progress Notes (Signed)
   Subjective:    Patient ID: Destiny Hurley, female    DOB: 03/29/1950, 66 y.o.   MRN: [id number]  HPI  Chief Complaint  Patient presents with  . Diabetes    last A1C 14  . Peripheral Neuropathy    numbness, tingling, burning, both feet   66 year old female presents the office today for concerns of burning, stinging, tingling to her feet mostly in her toes.  She states that this is been ongoing for some time and she is currently on gabapentin.  She is taking 600 mg in the morning and 750 mg at nighttime.  She states that does help but she continues to get the symptoms.  She denies any pain to her feet otherwise.  She tries to sleep it does wake her up.  She denies any swelling or redness.  She is diabetic her last A1c was 14.  She denies any open sores.  She denies any swelling or redness to her feet.  She has no other concerns today.    Review of Systems  Musculoskeletal: Positive for arthralgias and back pain.  All other systems reviewed and are negative.      Objective:   Physical Exam General: AAO x3, NAD  Dermatological: Skin is warm, dry and supple bilateral. Nails x 10 are well manicured; remaining integument appears unremarkable at this time. There are no open sores, no preulcerative lesions, no rash or signs of infection present.  Vascular: Dorsalis Pedis artery and Posterior Tibial artery pedal pulses are 2/4 bilateral with immedate capillary fill time. There is no pain with calf compression, swelling, warmth, erythema.   Neruologic: Sensation decreased systems Wednesday monofilament, decreased vibratory sensation.  Musculoskeletal: There is no area of pinpoint bony tenderness or pain to vibratory sensation.  Ankle, subtalar joint range of motion intact without any restrictions.  There is no overlying edema, erythema, increased warmth bilaterally.  Muscular strength 5/5 in all groups tested bilateral.      Assessment & Plan:  66 year old female with symptomatic  neuropathy, uncontrolled diabetes -Treatment options discussed including all alternatives, risks, and complications -Etiology of symptoms were discussed -At this time discussed her symptoms are likely more related to neuropathy.  We discussed increasing the gabapentin to 600 mg 3 times a day.  Also ordered topical compound cream through [facility] for neuropathy today to see if this will help. -She states that because she works second and third shift when she eats it is sporadic.  She has seen a nutritionist for this as well.  She states that her medications are recently been changed to hopefully her A1c will be lower. -Discussed daily foot inspection. -Follow-up in 2 months or sooner if any issues arise  Vivi BarrackMatthew R Arelia Volpe DPM

## 2016-11-27 NOTE — Patient Instructions (Signed)
Gabapentin capsules or tablets What is this medicine? GABAPENTIN (GA ba pen tin) is used to control partial seizures in adults with epilepsy. It is also used to treat certain types of nerve pain. This medicine may be used for other purposes; ask your health care provider or pharmacist if you have questions. COMMON BRAND NAME(S): Active-PAC with Gabapentin, Gabarone, Neurontin What should I tell my health care provider before I take this medicine? They need to know if you have any of these conditions: -kidney disease -suicidal thoughts, plans, or attempt; a previous suicide attempt by you or a family member -an unusual or allergic reaction to gabapentin, other medicines, foods, dyes, or preservatives -pregnant or trying to get pregnant -breast-feeding How should I use this medicine? Take this medicine by mouth with a glass of water. Follow the directions on the prescription label. You can take it with or without food. If it upsets your stomach, take it with food.Take your medicine at regular intervals. Do not take it more often than directed. Do not stop taking except on your doctor's advice. If you are directed to break the 600 or 800 mg tablets in half as part of your dose, the extra half tablet should be used for the next dose. If you have not used the extra half tablet within 28 days, it should be thrown away. A special MedGuide will be given to you by the pharmacist with each prescription and refill. Be sure to read this information carefully each time. Talk to your pediatrician regarding the use of this medicine in children. Special care may be needed. Overdosage: If you think you have taken too much of this medicine contact a poison control center or emergency room at once. NOTE: This medicine is only for you. Do not share this medicine with others. What if I miss a dose? If you miss a dose, take it as soon as you can. If it is almost time for your next dose, take only that dose. Do not  take double or extra doses. What may interact with this medicine? Do not take this medicine with any of the following medications: -other gabapentin products This medicine may also interact with the following medications: -alcohol -antacids -antihistamines for allergy, cough and cold -certain medicines for anxiety or sleep -certain medicines for depression or psychotic disturbances -homatropine; hydrocodone -naproxen -narcotic medicines (opiates) for pain -phenothiazines like chlorpromazine, mesoridazine, prochlorperazine, thioridazine This list may not describe all possible interactions. Give your health care provider a list of all the medicines, herbs, non-prescription drugs, or dietary supplements you use. Also tell them if you smoke, drink alcohol, or use illegal drugs. Some items may interact with your medicine. What should I watch for while using this medicine? Visit your doctor or health care professional for regular checks on your progress. You may want to keep a record at home of how you feel your condition is responding to treatment. You may want to share this information with your doctor or health care professional at each visit. You should contact your doctor or health care professional if your seizures get worse or if you have any new types of seizures. Do not stop taking this medicine or any of your seizure medicines unless instructed by your doctor or health care professional. Stopping your medicine suddenly can increase your seizures or their severity. Wear a medical identification bracelet or chain if you are taking this medicine for seizures, and carry a card that lists all your medications. You may get drowsy, dizzy,   or have blurred vision. Do not drive, use machinery, or do anything that needs mental alertness until you know how this medicine affects you. To reduce dizzy or fainting spells, do not sit or stand up quickly, especially if you are an older patient. Alcohol can  increase drowsiness and dizziness. Avoid alcoholic drinks. Your mouth may get dry. Chewing sugarless gum or sucking hard candy, and drinking plenty of water will help. The use of this medicine may increase the chance of suicidal thoughts or actions. Pay special attention to how you are responding while on this medicine. Any worsening of mood, or thoughts of suicide or dying should be reported to your health care professional right away. Women who become pregnant while using this medicine may enroll in the [place] Antiepileptic Drug Pregnancy Registry by calling 1-888-233-2334. This registry collects information about the safety of antiepileptic drug use during pregnancy. What side effects may I notice from receiving this medicine? Side effects that you should report to your doctor or health care professional as soon as possible: -allergic reactions like skin rash, itching or hives, swelling of the face, lips, or tongue -worsening of mood, thoughts or actions of suicide or dying Side effects that usually do not require medical attention (report to your doctor or health care professional if they continue or are bothersome): -constipation -difficulty walking or controlling muscle movements -dizziness -nausea -slurred speech -tiredness -tremors -weight gain This list may not describe all possible side effects. Call your doctor for medical advice about side effects. You may report side effects to FDA at 1-800-FDA-1088. Where should I keep my medicine? Keep out of reach of children. This medicine may cause accidental overdose and death if it taken by other adults, children, or pets. Mix any unused medicine with a substance like cat litter or coffee grounds. Then throw the medicine away in a sealed container like a sealed bag or a coffee can with a lid. Do not use the medicine after the expiration date. Store at room temperature between 15 and 30 degrees C (59 and 86 degrees F). NOTE: This  sheet is a summary. It may not cover all possible information. If you have questions about this medicine, talk to your doctor, pharmacist, or health care provider.  2018 Elsevier/Gold Standard (2013-03-03 15:26:50)  

## 2016-12-13 ENCOUNTER — Emergency Department (HOSPITAL_BASED_OUTPATIENT_CLINIC_OR_DEPARTMENT_OTHER): Payer: BLUE CROSS/BLUE SHIELD

## 2016-12-13 ENCOUNTER — Emergency Department (HOSPITAL_BASED_OUTPATIENT_CLINIC_OR_DEPARTMENT_OTHER)
Admission: EM | Admit: 2016-12-13 | Discharge: 2016-12-13 | Disposition: A | Discharge status: Home / Self Care | Payer: BLUE CROSS/BLUE SHIELD | Attending: Emergency Medicine | Admitting: Emergency Medicine

## 2016-12-13 ENCOUNTER — Encounter (HOSPITAL_BASED_OUTPATIENT_CLINIC_OR_DEPARTMENT_OTHER): Payer: Self-pay | Admitting: Emergency Medicine

## 2016-12-13 ENCOUNTER — Other Ambulatory Visit: Payer: Self-pay

## 2016-12-13 DIAGNOSIS — M79604 Pain in right leg: Secondary | ICD-10-CM | POA: Diagnosis present

## 2016-12-13 DIAGNOSIS — F17290 Nicotine dependence, other tobacco product, uncomplicated: Secondary | ICD-10-CM | POA: Diagnosis not present

## 2016-12-13 DIAGNOSIS — I10 Essential (primary) hypertension: Secondary | ICD-10-CM | POA: Insufficient documentation

## 2016-12-13 DIAGNOSIS — E119 Type 2 diabetes mellitus without complications: Secondary | ICD-10-CM | POA: Diagnosis not present

## 2016-12-13 MED ORDER — TRAMADOL HCL 50 MG PO TABS: 50.0000 mg | ORAL_TABLET | Freq: Four times a day (QID) | ORAL | 0 refills | Status: DC | PRN

## 2016-12-13 NOTE — Discharge Instructions (Signed)

## 2016-12-13 NOTE — ED Notes (Signed)
Pt waiting at nurses station for discharge paperwork.  Not wanting to wait for vs and to sign.

## 2016-12-13 NOTE — ED Triage Notes (Addendum)
R leg pain x 1 year. Seen by dr and dx with arthritis. Pt states it is worse today. Pt states at her last appt they increased the frequency of her gabapentin to 3 times daily. Pain persists.

## 2016-12-13 NOTE — ED Provider Notes (Signed)
Emergency Department Provider Note   I have reviewed the triage vital signs and the nursing notes.   HISTORY  Chief Complaint Leg Pain   HPI Destiny Hurley is a 66 y.o. female with PMH of DM, HTN, HLD, and chronic right leg pain presents to the emergency department for evaluation of.  She states the pain radiates from the right lateral hip down to the entire leg.  She is seen her primary care physician who has prescribed Tylenol, gabapentin, Motrin which she has been taking with little to no relief in symptoms.  He denies any back pain.  No groin numbness or urine incontinence/urgency.  No UTI symptoms.  Denies any recent injury.  She notes some swelling to the leg which is common for her.  She denies any chest pain or difficulty breathing. Pain is severe and worse with movement.    Past Medical History:  Diagnosis Date  . Diabetes mellitus   . Hyperlipemia   . Hypertension     Patient Active Problem List   Diagnosis Date Noted  . Metatarsal fracture 10/13/2010    Past Surgical History:  Procedure Laterality Date  . CHOLECYSTECTOMY      Current Outpatient Rx  . Order #: [id number] Class: Historical Med  . Order #: [id number] Class: Normal  . Order #: [id number] Class: Historical Med  . Order #: [id number] Class: Normal  . Order #: [id number] Class: Historical Med  . Order #: [id number] Class: Print  . Order #: [id number] Class: Historical Med  . Order #: [id number] Class: Historical Med  . Order #: [id number] Class: Historical Med  . Order #: [id number] Class: Print    Allergies Patient has no known allergies.  Family History  Problem Relation Age of Onset  . Heart disease Unknown   . Arthritis Unknown   . Lung disease Unknown   . Cancer Unknown   . Kidney disease Unknown   . Diabetes Unknown     Social History Social History   Tobacco Use  . Smoking status: Never Smoker  . Smokeless tobacco: Current User    Types: Chew  . Tobacco comment: Chews 1 pack per day    Substance Use Topics  . Alcohol use: No  . Drug use: No    Review of Systems  Constitutional: No fever/chills Eyes: No visual changes. ENT: No sore throat. Cardiovascular: Denies chest pain. Respiratory: Denies shortness of breath. Gastrointestinal: No abdominal pain.  No nausea, no vomiting.  No diarrhea.  No constipation. Genitourinary: Negative for dysuria. Musculoskeletal: Negative for back pain. Positive right leg/hip pain with mild swelling.  Skin: Negative for rash. Neurological: Negative for headaches, focal weakness or numbness.  10-point ROS otherwise negative.  ____________________________________________   PHYSICAL EXAM:  VITAL SIGNS: ED Triage Vitals  Enc Vitals Group     BP 12/13/16 1626 (!) 153/80     Pulse Rate 12/13/16 1626 83     Resp 12/13/16 1626 18     Temp 12/13/16 1626 98.2 F (36.8 C)     Temp Source 12/13/16 1626 Oral     SpO2 12/13/16 1626 100 %     Weight 12/13/16 1625 187 lb (84.8 kg)     Height 12/13/16 1625 5\' 5"  (1.651 m)     Pain Score 12/13/16 1625 10   Constitutional: Alert and oriented. Well appearing and in no acute distress. Eyes: Conjunctivae are normal.  Head: Atraumatic. Nose: No congestion/rhinnorhea. Mouth/Throat: Mucous membranes are moist.  Cardiovascular: Normal rate, regular rhythm. Good peripheral circulation. Grossly normal heart sounds.  Respiratory: Normal respiratory effort.  No retractions. Lungs CTAB. Gastrointestinal: Soft and nontender. No distention.  Musculoskeletal: Right leg discomfort with ROM with no joint swelling or redness. No focal tenderness. Mild swelling noted. No bruising or redness. Normal left leg ROM.  Neurologic:  Normal speech and language. No gross focal neurologic deficits are appreciated.  Skin:  Skin is warm, dry and intact. No rash noted.  ____________________________________________  RADIOLOGY  [place] Venous Img Lower Right (dvt Study)  Result Date: 12/13/2016 CLINICAL DATA:  Right  leg pain x1 year worse today. EXAM: RIGHT LOWER EXTREMITY VENOUS DOPPLER ULTRASOUND TECHNIQUE: Gray-scale sonography with graded compression, as well as color Doppler and duplex ultrasound were performed to evaluate the lower extremity deep venous systems from the level of the common femoral vein and including the common femoral, femoral, profunda femoral, popliteal and calf veins including the posterior tibial, peroneal and gastrocnemius veins when visible. The superficial great saphenous vein was also interrogated. Spectral Doppler was utilized to evaluate flow at rest and with distal augmentation maneuvers in the common femoral, femoral and popliteal veins. COMPARISON:  None. FINDINGS: Contralateral Common Femoral Vein: Respiratory phasicity is normal and symmetric with the symptomatic side. No evidence of thrombus. Normal compressibility. Common Femoral Vein: No evidence of thrombus. Normal compressibility, respiratory phasicity and response to augmentation. Saphenofemoral Junction: No evidence of thrombus. Normal compressibility and flow on color Doppler imaging. Profunda Femoral Vein: No evidence of thrombus. Normal compressibility and flow on color Doppler imaging. Femoral Vein: No evidence of thrombus. Normal compressibility, respiratory phasicity and response to augmentation. Popliteal Vein: No evidence of thrombus. Normal compressibility, respiratory phasicity and response to augmentation. Calf Veins: Nonocclusive partially compressible gastrocnemius vein. The posterior tibial and peroneal veins are patent with normal compressibility and flow on color Doppler imaging. Varicose veins are noted of the superior calf and distal fine. Superficial Great Saphenous Vein: No evidence of thrombus. Normal compressibility. Venous Reflux:  None. Other Findings:  None. IMPRESSION: No evidence of acute deep venous thrombosis. Partially compressible recannulized appearing gastrocnemius vein thrombosis consistent with  chronic thrombosis. Electronically Signed   By: Tollie Ethavid  Kwon [name]   On: 12/13/2016 19:35    ____________________________________________   PROCEDURES  Procedure(s) performed:   Procedures  NONE ____________________________________________   INITIAL IMPRESSION / ASSESSMENT AND PLAN / ED COURSE  Pertinent labs & imaging results that were available during my care of the patient were reviewed by me and considered in my medical decision making (see chart for details).  Patient presents emergency department for evaluation of symptoms of been ongoing for the past year and is been treated like a combination of either arthritis versus neuropathy.  Patient does have mild swelling of the right lower extremity.  In review of the records patient has not had an ultrasound to rule out under plan to perform that today and refer her back to her PCP for further pain management if negative.   US with no DVT. Chronic superficial thrombosis. Plan for pain control and PCP follow up for further pain mgmt.   At this time, I do not feel there is any life-threatening condition present. I have reviewed and discussed all results (EKG, imaging, lab, urine as appropriate), exam findings with patient. I have reviewed nursing notes and appropriate previous records.  I feel the patient is safe to be discharged home without further emergent workup. Discussed usual and customary return precautions. Patient and family (if present) verbalize understanding and are comfortable with this plan.  Patient will follow-up with  their primary care provider. If they do not have a primary care provider, information for follow-up has been provided to them. All questions have been answered. ____________________________________________  FINAL CLINICAL IMPRESSION(S) / ED DIAGNOSES  Final diagnoses:  Right leg pain     MEDICATIONS GIVEN DURING THIS VISIT:  None  NEW OUTPATIENT MEDICATIONS STARTED DURING THIS  VISIT:  Tramadol   Note:  This document was prepared using Dragon voice recognition software and may include unintentional dictation errors.  Alona Bene, MD Emergency Medicine    Long, Arlyss Repress, MD 12/14/16 1146

## 2017-02-05 ENCOUNTER — Other Ambulatory Visit: Payer: Self-pay | Admitting: Internal Medicine

## 2017-02-05 DIAGNOSIS — G608 Other hereditary and idiopathic neuropathies: Secondary | ICD-10-CM

## 2017-02-11 ENCOUNTER — Ambulatory Visit (HOSPITAL_COMMUNITY)
Admission: RE | Admit: 2017-02-11 | Discharge: 2017-02-11 | Disposition: A | Discharge status: Home / Self Care | Payer: BLUE CROSS/BLUE SHIELD | Source: Ambulatory Visit | Attending: Internal Medicine | Admitting: Internal Medicine

## 2017-02-11 DIAGNOSIS — M47816 Spondylosis without myelopathy or radiculopathy, lumbar region: Secondary | ICD-10-CM | POA: Diagnosis not present

## 2017-02-11 DIAGNOSIS — M5126 Other intervertebral disc displacement, lumbar region: Secondary | ICD-10-CM | POA: Insufficient documentation

## 2017-02-11 DIAGNOSIS — G608 Other hereditary and idiopathic neuropathies: Secondary | ICD-10-CM | POA: Insufficient documentation

## 2017-03-01 ENCOUNTER — Encounter: Payer: Self-pay | Admitting: Podiatry

## 2017-03-01 ENCOUNTER — Ambulatory Visit (INDEPENDENT_AMBULATORY_CARE_PROVIDER_SITE_OTHER): Payer: BLUE CROSS/BLUE SHIELD | Admitting: Podiatry

## 2017-03-01 DIAGNOSIS — L989 Disorder of the skin and subcutaneous tissue, unspecified: Secondary | ICD-10-CM | POA: Diagnosis not present

## 2017-03-01 DIAGNOSIS — L603 Nail dystrophy: Secondary | ICD-10-CM | POA: Diagnosis not present

## 2017-03-08 NOTE — Progress Notes (Signed)
Subjective: 67 year old female presents the office today for concerns of her right second toe nail becoming thick and discolored.  She denies any significant pain in the nail she denies any redness or drainage or any swelling.  She said no recent treatment for this.  She has no other concerns today.  Upon evaluation today she did have a mole on the arch of the right foot which did appear to have a callus along the area.  She states that this is not changed shape or size in the neck for several years. Denies any systemic complaints such as fevers, chills, nausea, vomiting. No acute changes since last appointment, and no other complaints at this time.   Objective: AAO x3, NAD DP/PT pulses palpable bilaterally, CRT less than 3 seconds Right second digit toenails dystrophic, discolored with yellow-brown discoloration.  There is no drainage or pus or swelling.  There is no clinical signs of infection noted to the toenail. Along the right arch of the foot is a well-circumscribed hyperpigmented lesion consistent with a mole however there is a callus on the area.  Did debride this area a small amount of bleeding did occur directly along the skin lesion.  I did send the shavings for a biopsy.  Discussed possible excision of the mole. No open lesions or pre-ulcerative lesions.  No pain with calf compression, swelling, warmth, erythema  Assessment: 67 year old female right second digit onychodystrophy; skin lesion right arch  Plan: -All treatment options discussed with the patient including all alternatives, risks, complications.  -I debrided the callus on the area of the skin lesion.  I was able to debride some of the hyperpigmented tissue as well.  [facility]. Specimen was given to Hadley PenLisa Cox, CMA. -Debrided the right second digit toenail without any complications or bleeding.  No signs of infection but continue to monitor.  Discussed treatment options with the nail thickening. -Top of  the biopsy or sooner if needed.  Call any questions or concerns. -Patient encouraged to call the office with any questions, concerns, change in symptoms.

## 2017-03-15 ENCOUNTER — Telehealth: Payer: Self-pay | Admitting: *Deleted

## 2017-03-15 NOTE — Telephone Encounter (Signed)
Please have her come in this week to discuss. Let her know that it is likely a mole but I want to take another look at it. I sent you a message from the result but please disregard that. Thanks.

## 2017-03-15 NOTE — Telephone Encounter (Signed)
Critical results scanned by S. Durant.

## 2017-03-16 NOTE — Telephone Encounter (Signed)
LVM for patient to return my call to discuss pathology results and schedule an appointment to see Dr Ardelle AntonWagoner this week or next week

## 2017-03-17 DIAGNOSIS — M519 Unspecified thoracic, thoracolumbar and lumbosacral intervertebral disc disorder: Secondary | ICD-10-CM | POA: Insufficient documentation

## 2017-03-17 NOTE — Telephone Encounter (Signed)
Attempted to call the patient to discuss [facility] results. No answer. Left voicemail to call back to discuss.

## 2017-03-19 ENCOUNTER — Telehealth: Payer: Self-pay | Admitting: Podiatry

## 2017-03-19 NOTE — Telephone Encounter (Signed)
Attempted to called patient again to discuss results of biopsy, no answer. Left voicemail to call back.

## 2017-03-21 ENCOUNTER — Other Ambulatory Visit: Payer: Self-pay | Admitting: Podiatry

## 2017-03-25 ENCOUNTER — Encounter: Payer: Self-pay | Admitting: Podiatry

## 2017-03-25 ENCOUNTER — Ambulatory Visit (INDEPENDENT_AMBULATORY_CARE_PROVIDER_SITE_OTHER): Payer: BLUE CROSS/BLUE SHIELD | Admitting: Podiatry

## 2017-03-25 DIAGNOSIS — L989 Disorder of the skin and subcutaneous tissue, unspecified: Secondary | ICD-10-CM | POA: Diagnosis not present

## 2017-03-25 MED ORDER — GABAPENTIN 800 MG PO TABS
800.0000 mg | ORAL_TABLET | Freq: Three times a day (TID) | ORAL | 0 refills | Status: DC
Start: 2017-03-25 — End: 2017-04-08

## 2017-03-25 NOTE — Patient Instructions (Signed)
Gabapentin capsules or tablets What is this medicine? GABAPENTIN (GA ba pen tin) is used to control partial seizures in adults with epilepsy. It is also used to treat certain types of nerve pain. This medicine may be used for other purposes; ask your health care provider or pharmacist if you have questions. COMMON BRAND NAME(S): Active-PAC with Gabapentin, Gabarone, Neurontin What should I tell my health care provider before I take this medicine? They need to know if you have any of these conditions: -kidney disease -suicidal thoughts, plans, or attempt; a previous suicide attempt by you or a family member -an unusual or allergic reaction to gabapentin, other medicines, foods, dyes, or preservatives -pregnant or trying to get pregnant -breast-feeding How should I use this medicine? Take this medicine by mouth with a glass of water. Follow the directions on the prescription label. You can take it with or without food. If it upsets your stomach, take it with food.Take your medicine at regular intervals. Do not take it more often than directed. Do not stop taking except on your doctor's advice. If you are directed to break the 600 or 800 mg tablets in half as part of your dose, the extra half tablet should be used for the next dose. If you have not used the extra half tablet within 28 days, it should be thrown away. A special MedGuide will be given to you by the pharmacist with each prescription and refill. Be sure to read this information carefully each time. Talk to your pediatrician regarding the use of this medicine in children. Special care may be needed. Overdosage: If you think you have taken too much of this medicine contact a poison control center or emergency room at once. NOTE: This medicine is only for you. Do not share this medicine with others. What if I miss a dose? If you miss a dose, take it as soon as you can. If it is almost time for your next dose, take only that dose. Do not  take double or extra doses. What may interact with this medicine? Do not take this medicine with any of the following medications: -other gabapentin products This medicine may also interact with the following medications: -alcohol -antacids -antihistamines for allergy, cough and cold -certain medicines for anxiety or sleep -certain medicines for depression or psychotic disturbances -homatropine; hydrocodone -naproxen -narcotic medicines (opiates) for pain -phenothiazines like chlorpromazine, mesoridazine, prochlorperazine, thioridazine This list may not describe all possible interactions. Give your health care provider a list of all the medicines, herbs, non-prescription drugs, or dietary supplements you use. Also tell them if you smoke, drink alcohol, or use illegal drugs. Some items may interact with your medicine. What should I watch for while using this medicine? Visit your doctor or health care professional for regular checks on your progress. You may want to keep a record at home of how you feel your condition is responding to treatment. You may want to share this information with your doctor or health care professional at each visit. You should contact your doctor or health care professional if your seizures get worse or if you have any new types of seizures. Do not stop taking this medicine or any of your seizure medicines unless instructed by your doctor or health care professional. Stopping your medicine suddenly can increase your seizures or their severity. Wear a medical identification bracelet or chain if you are taking this medicine for seizures, and carry a card that lists all your medications. You may get drowsy, dizzy,   or have blurred vision. Do not drive, use machinery, or do anything that needs mental alertness until you know how this medicine affects you. To reduce dizzy or fainting spells, do not sit or stand up quickly, especially if you are an older patient. Alcohol can  increase drowsiness and dizziness. Avoid alcoholic drinks. Your mouth may get dry. Chewing sugarless gum or sucking hard candy, and drinking plenty of water will help. The use of this medicine may increase the chance of suicidal thoughts or actions. Pay special attention to how you are responding while on this medicine. Any worsening of mood, or thoughts of suicide or dying should be reported to your health care professional right away. Women who become pregnant while using this medicine may enroll in the [place] Antiepileptic Drug Pregnancy Registry by calling 1-888-233-2334. This registry collects information about the safety of antiepileptic drug use during pregnancy. What side effects may I notice from receiving this medicine? Side effects that you should report to your doctor or health care professional as soon as possible: -allergic reactions like skin rash, itching or hives, swelling of the face, lips, or tongue -worsening of mood, thoughts or actions of suicide or dying Side effects that usually do not require medical attention (report to your doctor or health care professional if they continue or are bothersome): -constipation -difficulty walking or controlling muscle movements -dizziness -nausea -slurred speech -tiredness -tremors -weight gain This list may not describe all possible side effects. Call your doctor for medical advice about side effects. You may report side effects to FDA at 1-800-FDA-1088. Where should I keep my medicine? Keep out of reach of children. This medicine may cause accidental overdose and death if it taken by other adults, children, or pets. Mix any unused medicine with a substance like cat litter or coffee grounds. Then throw the medicine away in a sealed container like a sealed bag or a coffee can with a lid. Do not use the medicine after the expiration date. Store at room temperature between 15 and 30 degrees C (59 and 86 degrees F). NOTE: This  sheet is a summary. It may not cover all possible information. If you have questions about this medicine, talk to your doctor, pharmacist, or health care provider.  2018 Elsevier/Gold Standard (2013-03-03 15:26:50)  

## 2017-03-29 NOTE — Progress Notes (Signed)
Subjective: Ms. Gala Lewandowskyorain presents the office today for follow-up evaluation of a skin lesion to the right foot.  This is not last saw her last appointment however had a somewhat unusual appearance to have some callus formation on the area.  Send this for a biopsy she presents today for follow-up evaluation of this.  Upon questioning her today she states that the area has been there for several years and has been unchanged.  She does not feel that the area has gotten bigger change in shape or color.  She has not noticed any drainage from the area. Denies any systemic complaints such as fevers, chills, nausea, vomiting. No acute changes since last appointment, and no other complaints at this time.   Objective: AAO x3, NAD DP/PT pulses palpable bilaterally, CRT less than 3 seconds Along the medial aspect of the right foot the hyperpigmented lesion measuring approximately 0.3 x 0.3 centimeters.  It is uniform in color.  There is no opening or drainage coming from the area.  There is no pain. No open lesions or pre-ulcerative lesions.  No pain with calf compression, swelling, warmth, erythema      Assessment: Skin lesion right foot  Plan: -All treatment options discussed with the patient including all alternatives, risks, complications.  -I discussed nail biopsy results clinic gave her a copy of the results today.  It shows a combined nevus, blue and intradermal type with significant crush artifact present.  Given some of the staining that they performed it argues against a malignant lesion.  I discussed with her options at this point.  I discussed with her I would recommend removing the mole today however she declines this.  She wishes to continue to monitor.  If there is any changes she is to call the office if she agrees with this.  Vivi BarrackMatthew R Wagoner DPM  -Patient encouraged to call the office with any questions, concerns, change in symptoms.

## 2017-04-01 ENCOUNTER — Ambulatory Visit (INDEPENDENT_AMBULATORY_CARE_PROVIDER_SITE_OTHER): Payer: BLUE CROSS/BLUE SHIELD | Admitting: Internal Medicine

## 2017-04-01 ENCOUNTER — Telehealth: Payer: Self-pay

## 2017-04-01 ENCOUNTER — Encounter: Payer: Self-pay | Admitting: Internal Medicine

## 2017-04-01 VITALS — BP 127/71 | HR 77 | Temp 98.0°F | Ht 65.0 in | Wt 180.3 lb

## 2017-04-01 DIAGNOSIS — Z800 Family history of malignant neoplasm of digestive organs: Secondary | ICD-10-CM

## 2017-04-01 DIAGNOSIS — Z79899 Other long term (current) drug therapy: Secondary | ICD-10-CM

## 2017-04-01 DIAGNOSIS — M6289 Other specified disorders of muscle: Secondary | ICD-10-CM | POA: Insufficient documentation

## 2017-04-01 DIAGNOSIS — Z794 Long term (current) use of insulin: Secondary | ICD-10-CM | POA: Diagnosis not present

## 2017-04-01 DIAGNOSIS — F17220 Nicotine dependence, chewing tobacco, uncomplicated: Secondary | ICD-10-CM | POA: Diagnosis not present

## 2017-04-01 DIAGNOSIS — E1140 Type 2 diabetes mellitus with diabetic neuropathy, unspecified: Secondary | ICD-10-CM | POA: Diagnosis not present

## 2017-04-01 DIAGNOSIS — I10 Essential (primary) hypertension: Secondary | ICD-10-CM | POA: Diagnosis not present

## 2017-04-01 DIAGNOSIS — M6281 Muscle weakness (generalized): Secondary | ICD-10-CM

## 2017-04-01 DIAGNOSIS — K529 Noninfective gastroenteritis and colitis, unspecified: Secondary | ICD-10-CM

## 2017-04-01 DIAGNOSIS — Z833 Family history of diabetes mellitus: Secondary | ICD-10-CM | POA: Diagnosis not present

## 2017-04-01 LAB — GLUCOSE, CAPILLARY: Glucose-Capillary: 311 mg/dL — ABNORMAL HIGH (ref 65–99)

## 2017-04-01 MED ORDER — LISINOPRIL 10 MG PO TABS: 10.0000 mg | ORAL_TABLET | Freq: Every day | ORAL | 2 refills | Status: DC

## 2017-04-01 MED ORDER — INSULIN PEN NEEDLE 32G X 4 MM MISC: 10.0000 [IU] | Freq: Every day | 3 refills | Status: DC

## 2017-04-01 MED ORDER — INSULIN GLARGINE 100 UNITS/ML SOLOSTAR PEN: 10.0000 [IU] | PEN_INJECTOR | Freq: Every day | SUBCUTANEOUS | 11 refills | Status: DC

## 2017-04-01 NOTE — Patient Instructions (Addendum)
Thank you for visiting clinic today. As we discussed that your blood sugar is high and we need to control it well to help you with your thigh pain. I am making changes in your insulin-giving you a new prescription of Lantus 10 units at bedtime. We are also giving you a continuous glucose monitor-he will follow-up in 1 week for download, please follow the instructions given with it. We will make necessity changes to your medication according to your blood sugar results. I am also checking some lab work to further evaluate your thigh pain. I am also referring you to see neurology for your nerve testing. As you told me that you cannot take gabapentin 600 mg 3 times a day, you can try taking 2 tablets of 600 mg at bedtime and 1 in the morning, if you continue to experience pain you can increase your morning dose to 2 tablets too.  Please let us know if that will help with your pain. Your blood pressure looks okay-continue taking lisinopril 10 mg daily. Follow-up in 1 week for your monitor reading.

## 2017-04-01 NOTE — Progress Notes (Signed)
Freestyle Libre Pro CGM sensor placed and started. Patient was educated about wearing sensor, keeping food, activity and medication log and when to call office. Follow up was arranged with the patient. LOT [id number], SN [id number], Exp 08/18/17

## 2017-04-01 NOTE — Assessment & Plan Note (Signed)
She is having chronic intermittent diarrhea for many years. She does not think that it is related to Metformin as her symptoms never changed when Metformin was stopped in the past. She has no other red flag symptoms. She is due for colonoscopy.  Referral for gastroenterology was provided for colonoscopy and for her chronic diarrhea.

## 2017-04-01 NOTE — Assessment & Plan Note (Signed)
She has uncontrolled diabetes.  She is unable to check her blood sugar and give her Humalog according to sliding scale because of her job schedule.  She was asking for a once daily dose. She was also given a prescription of gabapentin 600 mg 3 times a day, there was one prescription of gabapentin 800 mg 3 times a day in the chart, but patient was not aware of that.  She is just taking gabapentin 600 mg twice daily because of convenience and her work schedule.  Gabapentin was helping with her glove and stocking neuropathy but not with her thigh pain. -She was advised to take 2 pills of 600 mg at bedtime and continue taking 1 pill of 600 mg in the morning, if she does not noticed any change in her pain she can increase the morning dose to 1200 mg too.  If she does not respond well to increase in the dose of gabapentin, we can try getting her approved for Lyrica.  She was provided with a CGM today which will help with better control of her diabetes. She was started on Lantus 10 units at bedtime. She was advised to continue Invokana and Metformin at this time. We will reevaluate and make necessity changes if needed during next follow-up visit. She will follow-up in 1 week for CGM download.

## 2017-04-01 NOTE — Progress Notes (Signed)
CC: Bilateral thigh pain and to establish care.  HPI:  Destiny Hurley is a 67 y.o. with past medical history significant for hypertension and diabetes came to our clinic to establish care with [place].  She was seen at [facility] with [facility] for many years, apparently developed  pain in her thighs, mostly posterior and hamstring region, started on her right thigh now it is bilateral, associated with muscle wasting, weakness and weight loss since October 2018.  Denies any back pain.  She was provided with gabapentin with no relief, according to chart review she was given given Lyrica but her insurance did not approve.  Now she is having difficulty with standing from sitting position.  She denies any tingling or numbness.  She do have glove and stocking presentation of diabetic neuropathy and gabapentin does help with those symptoms but not with her thigh pain.  Patient has uncontrolled diabetes, her last A1c done in March 09, 2017 was 11.2, although improved from his previous one of about 22.  Currently she is on Invokana 300 mg daily, Metformin 1000 mg twice daily and recently started on Humalog with sliding scale, patient does not check her blood sugar multiple times due to her job schedule and just take 4-5 units of Humalog once or twice a day.  She was seeing an endocrinologist at [facility] clinic, stating that her endocrinologist is leaving that practice and she do not want to go back to that clinic.  She did bring her glucometer which shows an average glucose reading of 213 with multiple spikes close to 260.  Patient does not watch her diet and drink a lot of sweet tea.  She denies any increased urinary frequency or burning micturition. She was given Trulicity in the past which causes nausea and vomiting.  She was also complaining of chronic intermittent diarrhea, according to chart she was referred to gastroenterologist but patient denied.  She had and family history of colon cancer in  her mother and her last colonoscopy was in 2014 with recommendation for follow-up in 5 years.  Currently her diarrhea is better.  There is no association with any kind of food.  She denies any associated abdominal cramp, it mostly occurs, in the morning and resolved after having 3-4 episodes of loose stool.  Denies any melena or hematochezia.  She denies any change in her appetite.  She denies any nausea or vomiting.  Family history.  A lot of family members with diabetes, mother died of colon cancer at the age of 94.  Social history.  She chew tobacco, recently stopped for a few months and then restarted.  Denies any alcohol or illicit drug use.  Past Medical History:  Diagnosis Date  . Diabetes mellitus   . Hyperlipemia   . Hypertension    Review of Systems: Negative except mentioned in HPI  Physical Exam:  Vitals:   04/01/17 1027  BP: 127/71  Pulse: 77  Temp: 98 F (36.7 C)  TempSrc: Oral  SpO2: 100%  Weight: 180 lb 4.8 oz (81.8 kg)  Height: 5\' 5"  (1.651 m)    General: Vital signs reviewed.  Patient is well-developed and well-nourished, in no acute distress and cooperative with exam.  Head: Normocephalic and atraumatic. Eyes: EOMI, conjunctivae normal, no scleral icterus.  Neck: Supple, trachea midline, normal ROM, no JVD, masses, thyromegaly, or carotid bruit present.  Cardiovascular: RRR, S1 normal, S2 normal, no murmurs, gallops, or rubs. Pulmonary/Chest: Clear to auscultation bilaterally, no wheezes, rales, or rhonchi.  Abdominal: Soft, non-tender, non-distended, BS +, no masses, organomegaly, or guarding present.  Musculoskeletal: No joint deformities, erythema, or stiffness, ROM full and nontender. Extremities: No lower extremity edema bilaterally,  pulses symmetric and intact bilaterally. No cyanosis or clubbing. Neurological: A&O x3, cranial nerve II-XII are grossly intact, mild  muscle wasting in her thighs, strength 4/ 5 in thighs rest 5/5 at lower legs and upper  extremities bilaterally, sensory intact to light touch bilaterally.  Skin: Warm, dry and intact. No rashes or erythema. Psychiatric: Normal mood and affect. speech and behavior is normal. Cognition and memory are normal.  Assessment & Plan:   See Encounters Tab for problem based charting.  Patient discussed with Dr. Sandre Kittyaines.

## 2017-04-01 NOTE — Assessment & Plan Note (Addendum)
Her symptoms of hamstring pain, along with proximal muscle wasting and weakness with weight loss are more consistent with diabetic amyotrophy. Unfortunately there is not much treatment available at this time. Occasionally high-dose steroid can be tried but we will avoid at this time because of her uncontrolled diabetes.  We will see if the increase in dose of gabapentin will help. If gabapentin does not help we can try switching her to Lyrica. We will check ESR, CRP, CK and HIV today. She was provided with referral to neurology for nerve conduction studies.  Addendum.  She is having elevated CRP at 24, ESR and CK within normal limit and HIV nonreactive.

## 2017-04-01 NOTE — Assessment & Plan Note (Signed)
BP Readings from Last 3 Encounters:  04/01/17 127/71  12/13/16 (!) 153/80  11/27/16 126/70   She was normotensive today.  Continue lisinopril 10 mg daily.

## 2017-04-02 LAB — SEDIMENTATION RATE: Sed Rate: 14 mm/hr (ref 0–40)

## 2017-04-02 LAB — HIV ANTIBODY (ROUTINE TESTING W REFLEX): HIV Screen 4th Generation wRfx: NONREACTIVE

## 2017-04-02 LAB — CK: Total CK: 78 U/L (ref 24–173)

## 2017-04-02 LAB — C-REACTIVE PROTEIN: CRP: 24.5 mg/L — ABNORMAL HIGH (ref 0.0–4.9)

## 2017-04-02 NOTE — Progress Notes (Signed)
Internal Medicine Clinic Attending  Case discussed with Dr. Amin  at the time of the visit.  We reviewed the resident's history and exam and pertinent patient test results.  I agree with the assessment, diagnosis, and plan of care documented in the resident's note.  Alexander N Raines, MD   

## 2017-04-08 ENCOUNTER — Encounter: Payer: Self-pay | Admitting: Internal Medicine

## 2017-04-08 ENCOUNTER — Ambulatory Visit (INDEPENDENT_AMBULATORY_CARE_PROVIDER_SITE_OTHER): Payer: BLUE CROSS/BLUE SHIELD | Admitting: Internal Medicine

## 2017-04-08 ENCOUNTER — Other Ambulatory Visit: Payer: Self-pay

## 2017-04-08 VITALS — BP 135/75 | HR 90 | Temp 98.5°F | Ht 65.0 in | Wt 174.2 lb

## 2017-04-08 DIAGNOSIS — E1140 Type 2 diabetes mellitus with diabetic neuropathy, unspecified: Secondary | ICD-10-CM | POA: Diagnosis not present

## 2017-04-08 DIAGNOSIS — Z6828 Body mass index (BMI) 28.0-28.9, adult: Secondary | ICD-10-CM

## 2017-04-08 DIAGNOSIS — I10 Essential (primary) hypertension: Secondary | ICD-10-CM

## 2017-04-08 DIAGNOSIS — Z794 Long term (current) use of insulin: Secondary | ICD-10-CM | POA: Diagnosis not present

## 2017-04-08 DIAGNOSIS — M6281 Muscle weakness (generalized): Secondary | ICD-10-CM

## 2017-04-08 DIAGNOSIS — M6289 Other specified disorders of muscle: Secondary | ICD-10-CM

## 2017-04-08 DIAGNOSIS — M62551 Muscle wasting and atrophy, not elsewhere classified, right thigh: Secondary | ICD-10-CM | POA: Diagnosis not present

## 2017-04-08 DIAGNOSIS — R634 Abnormal weight loss: Secondary | ICD-10-CM

## 2017-04-08 DIAGNOSIS — Z79899 Other long term (current) drug therapy: Secondary | ICD-10-CM | POA: Diagnosis not present

## 2017-04-08 DIAGNOSIS — M79651 Pain in right thigh: Secondary | ICD-10-CM | POA: Diagnosis not present

## 2017-04-08 DIAGNOSIS — K529 Noninfective gastroenteritis and colitis, unspecified: Secondary | ICD-10-CM | POA: Diagnosis not present

## 2017-04-08 DIAGNOSIS — M62552 Muscle wasting and atrophy, not elsewhere classified, left thigh: Secondary | ICD-10-CM

## 2017-04-08 DIAGNOSIS — M79652 Pain in left thigh: Secondary | ICD-10-CM

## 2017-04-08 MED ORDER — GABAPENTIN 600 MG PO TABS: 1200.0000 mg | ORAL_TABLET | Freq: Two times a day (BID) | ORAL | 5 refills | Status: DC

## 2017-04-08 MED ORDER — INSULIN GLARGINE 100 UNITS/ML SOLOSTAR PEN: 20.0000 [IU] | PEN_INJECTOR | Freq: Every day | SUBCUTANEOUS | 11 refills | Status: DC

## 2017-04-08 MED ORDER — DULOXETINE HCL 30 MG PO CPEP: 30.0000 mg | ORAL_CAPSULE | Freq: Two times a day (BID) | ORAL | 2 refills | Status: DC

## 2017-04-08 NOTE — Assessment & Plan Note (Signed)
She came today for her first download of CGM. She had an average glucose of 250 mg/dl, 09%80% remained above target of 180 and 20% within target below 180, no episodes of any hypoglycemia.  Her blood sugar remained high during the later part of the night too, there is mild decrease around 9 AM most of the days.  She is currently taking Lantus 10 units at bedtime along with Invokana 300 mg daily and Metformin 1000 mg twice daily.  -Increase Lantus to 20 units at bedtime. -She will follow-up next week for second download.

## 2017-04-08 NOTE — Assessment & Plan Note (Signed)
She continued to experiencing thigh muscle wasting and weakness. There is some improvement in her pain after increasing gabapentin 1200 mg twice daily, and never resolved completely. She continued to experience weight loss.  -Continue gabapentin 1200 mg twice daily. -Add Cymbalta 30 mg daily for 1 week then increase it to 30 mg twice daily. -She was asked to make her appointment with neurology for nerve conduction studies, apparently they called her twice and she missed the call and never called them back.  [facility].

## 2017-04-08 NOTE — Assessment & Plan Note (Signed)
BP Readings from Last 3 Encounters:  04/08/17 135/75  04/01/17 127/71  12/13/16 (!) 153/80   Blood pressure was within goal.  Continue lisinopril 10 mg daily.

## 2017-04-08 NOTE — Patient Instructions (Addendum)
Thank you for visiting clinic today. As we discussed your blood sugar remained quite high all the time, I am increasing your Lantus to 20 units at bedtime. I am also adding another medication called Cymbalta for your leg pain, you will take it 30 mg daily for 1 week and then increase to 30 mg twice daily. Please call and make an appointment with gastroenterology for your diarrhea and neurology for your nerve studies. Please follow-up in 1 week for second download of your monitor.  FOLLOW-UP INSTRUCTIONS When: 1 week For: Second download of CGM What to bring: Your food log.

## 2017-04-08 NOTE — Assessment & Plan Note (Signed)
Most likely multifactorial, she might been catabolic state with depletion of her insulin stores.  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  She lost about 6 pounds in 1 week, her weight last week was 180 pound and today it was 174 pound.  -Increase Lantus to 20 units for better control of her diabetes and to give her some insulin.  She might need higher doses. -She had GI appointment on April 15, 2017 to evaluate diarrhea. -She was asked to call [facility] to make an appointment for nerve conduction studies as soon as possible. -Continue monitoring.

## 2017-04-08 NOTE — Progress Notes (Signed)
   CC: For first download of CGM at 1 week.  HPI:  Destiny Hurley is a 67 y.o. with past medical history as listed below came to the clinic for her first week of CGM download.  Patient established care last week at our clinic, she had uncontrolled diabetes with last A1c done in February 2019 was 11.2, she was also complaining of weight loss, thigh muscle wasting and pain in her both thighs.  She was provided with a CGM for better control of her blood sugar.  She continued to experience diarrhea, it got worse last night after eating spaghetti with cheese, she had 4 loose bowel movements overnight.  She also continued to lose weight.  Please see assessment and plan for her chronic conditions.  Past Medical History:  Diagnosis Date  . Diabetes mellitus   . Hyperlipemia   . Hypertension    Review of Systems: Negative except mentioned in HPI.  Physical Exam:  Vitals:   04/08/17 1324  BP: 135/75  Pulse: 90  Temp: 98.5 F (36.9 C)  TempSrc: Oral  SpO2: 100%  Weight: 174 lb 3.2 oz (79 kg)  Height: 5\' 5"  (1.651 m)    General: Vital signs reviewed.  Patient is well-developed and well-nourished, in no acute distress and cooperative with exam.  Head: Normocephalic and atraumatic. Eyes: EOMI, conjunctivae normal, no scleral icterus.  Cardiovascular: RRR, S1 normal, S2 normal, no murmurs, gallops, or rubs. Pulmonary/Chest: Clear to auscultation bilaterally, no wheezes, rales, or rhonchi. Abdominal: Soft, non-tender, non-distended, BS +, no masses, organomegaly, or guarding present.  Extremities: No lower extremity edema bilaterally,  pulses symmetric and intact bilaterally. No cyanosis or clubbing. Skin: Warm, dry and intact. No rashes or erythema. Psychiatric: Normal mood and affect. speech and behavior is normal. Cognition and memory are normal.  Assessment & Plan:   See Encounters Tab for problem based charting.  Patient discussed with Dr. Oswaldo DoneVincent.

## 2017-04-08 NOTE — Assessment & Plan Note (Signed)
She continued to experience diarrhea, got worse last night after eating spaghetti with cheese.  In the past she never noticed any relationship of worsening of diarrhea with dairy products. She might be experiencing some autonomic dysfunction causing diarrhea, as her diarrhea never resolved when Metformin was stopped. Metformin was resumed again.  She had her GI appointment on April 15, 2017 for further evaluation of her chronic diarrhea.

## 2017-04-09 NOTE — Progress Notes (Signed)
Internal Medicine Clinic Attending  Case discussed with Dr. Amin at the time of the visit.  We reviewed the resident's history and exam and pertinent patient test results.  I agree with the assessment, diagnosis, and plan of care documented in the resident's note.    

## 2017-04-12 ENCOUNTER — Telehealth: Payer: Self-pay | Admitting: Internal Medicine

## 2017-04-12 NOTE — Telephone Encounter (Signed)
Was prescribed Cymbalta & Gaba on last OV, [facility] [address]  Called & transferred meds. Patient stated will pick up meds tomorrow.

## 2017-04-12 NOTE — Telephone Encounter (Signed)
Patient is calling because the Dr was putting in a prescription for some pain medicine and she got some insulin medicine. She still need her pain medicine

## 2017-04-16 ENCOUNTER — Encounter: Payer: Self-pay | Admitting: Internal Medicine

## 2017-04-16 ENCOUNTER — Other Ambulatory Visit: Payer: Self-pay

## 2017-04-16 ENCOUNTER — Ambulatory Visit (INDEPENDENT_AMBULATORY_CARE_PROVIDER_SITE_OTHER): Payer: BLUE CROSS/BLUE SHIELD | Admitting: Internal Medicine

## 2017-04-16 VITALS — BP 131/68 | HR 86 | Temp 98.4°F | Ht 65.0 in | Wt 184.9 lb

## 2017-04-16 DIAGNOSIS — R634 Abnormal weight loss: Secondary | ICD-10-CM | POA: Diagnosis not present

## 2017-04-16 DIAGNOSIS — M6289 Other specified disorders of muscle: Secondary | ICD-10-CM

## 2017-04-16 DIAGNOSIS — K529 Noninfective gastroenteritis and colitis, unspecified: Secondary | ICD-10-CM

## 2017-04-16 DIAGNOSIS — M6281 Muscle weakness (generalized): Secondary | ICD-10-CM

## 2017-04-16 DIAGNOSIS — Z794 Long term (current) use of insulin: Secondary | ICD-10-CM

## 2017-04-16 DIAGNOSIS — E1140 Type 2 diabetes mellitus with diabetic neuropathy, unspecified: Secondary | ICD-10-CM

## 2017-04-16 DIAGNOSIS — Z6830 Body mass index (BMI) 30.0-30.9, adult: Secondary | ICD-10-CM

## 2017-04-16 DIAGNOSIS — Z79899 Other long term (current) drug therapy: Secondary | ICD-10-CM

## 2017-04-16 DIAGNOSIS — I10 Essential (primary) hypertension: Secondary | ICD-10-CM | POA: Diagnosis not present

## 2017-04-16 LAB — POCT GLYCOSYLATED HEMOGLOBIN (HGB A1C): Hemoglobin A1C: 10.9

## 2017-04-16 LAB — GLUCOSE, CAPILLARY: Glucose-Capillary: 209 mg/dL — ABNORMAL HIGH (ref 65–99)

## 2017-04-16 MED ORDER — SITAGLIPTIN PHOSPHATE 100 MG PO TABS: 100.0000 mg | ORAL_TABLET | Freq: Every day | ORAL | 2 refills | Status: DC

## 2017-04-16 NOTE — Progress Notes (Signed)
Internal Medicine Clinic Attending  Case discussed with Dr. Amin at the time of the visit.  We reviewed the resident's history and exam and pertinent patient test results.  I agree with the assessment, diagnosis, and plan of care documented in the resident's note.    

## 2017-04-16 NOTE — Assessment & Plan Note (Signed)
BP Readings from Last 3 Encounters:  04/16/17 131/68  04/08/17 135/75  04/01/17 127/71   Blood pressure was within goal.  New current management.

## 2017-04-16 NOTE — Progress Notes (Signed)
   CC: For her second download of CGM.  HPI:  [name] is a 67 y.o. with past medical history as listed below came to the clinic for her second download of CGM.  Please see assessment and plan for her chronic conditions.  Past Medical History:  Diagnosis Date  . Diabetes mellitus   . Hyperlipemia   . Hypertension    Review of Systems: Negative except mentioned in HPI.  Physical Exam:  Vitals:   04/16/17 1551  BP: 131/68  Pulse: 86  Temp: 98.4 F (36.9 C)  TempSrc: Oral  SpO2: 100%  Weight: 184 lb 14.4 oz (83.9 kg)  Height: 5\' 5"  (1.651 m)    General: Vital signs reviewed.  Patient is well-developed and well-nourished, in no acute distress and cooperative with exam.  Head: Normocephalic and atraumatic. Eyes: EOMI, conjunctivae normal, no scleral icterus.  Cardiovascular: RRR, S1 normal, S2 normal, no murmurs, gallops, or rubs. Pulmonary/Chest: Clear to auscultation bilaterally, no wheezes, rales, or rhonchi. Abdominal: Soft, non-tender, non-distended, BS +, no masses, organomegaly, or guarding present.  Extremities: No lower extremity edema bilaterally,  pulses symmetric and intact bilaterally. No cyanosis or clubbing. Skin: Warm, dry and intact. No rashes or erythema. Psychiatric: Normal mood and affect. speech and behavior is normal. Cognition and memory are normal.  Assessment & Plan:   See Encounters Tab for problem based charting.  Patient discussed with Dr. Rogelia BogaButcher.

## 2017-04-16 NOTE — Assessment & Plan Note (Signed)
She continued to have pain in her thighs. She was given a prescription of Cymbalta last visit, which she never started yet.  She was advised to start taking Cymbalta. Her neurology appointment for nerve conduction studies is on April 22, 2017. Continue taking gabapentin.

## 2017-04-16 NOTE — Patient Instructions (Addendum)
Thank you for visiting clinic today. As we discussed your blood sugar remained little high, please watch your diet, eat low carbohydrate diet. I am adding a new medicine called Januvia or sitagliptin, you will take 1 pill daily, once you finish your Metformin we can combine both pills in 1. Please make an appointment with Dr. Selena BattenKim to be seen next week to discuss getting your personal continuous glucose monitor. Please follow-up with neurology according to your scheduled appointment to get your nerve conduction studies. Start taking Cymbalta 30 mg daily for 1 week and then increase it to 60 mg daily. Please follow-up with me in 1 month.

## 2017-04-16 NOTE — Assessment & Plan Note (Addendum)
Destiny SlatesAnnie Hurley Dement wore the CGM for 14 days. The average reading was 238, % time in target was 27, % time below target was 0, and % time above target was. 73. Intervention will be to add Januvia. The patient will be scheduled to see Destiny Hurley for a final appointment.   A1c today was 10.9.  It was sent to Destiny Hurley to help patient to get her personal continuous glucose monitor. She will need an appointment with Destiny Hurley once she is back from her vacation.  Patient was concerned that she has 2 bottles full of Metformin and do not want to waste them, a new prescription of Januvia was given.  Once she will finish her existing Metformin we can combine Januvia and Metformin. She will continue Lantus 20 units daily, as her blood sugar improved after increasing the dose of Lantus to 20, although still little high, she was very close to her lows and increasing Lantus might cause hypoglycemia. Follow-up in 1 month.  She had her foot exam today. 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 Mammogram was done in April 2018. GI appointment next week for chronic diarrhea and colonoscopy.

## 2017-04-16 NOTE — Assessment & Plan Note (Signed)
Her weight was stable today. It was up during current clinic visit-according to patient she is checking her weight and it stays stable and our scale is showing it more.

## 2017-04-16 NOTE — Assessment & Plan Note (Signed)
Has had appointment with gastroenterology next week.

## 2017-04-21 ENCOUNTER — Ambulatory Visit (INDEPENDENT_AMBULATORY_CARE_PROVIDER_SITE_OTHER): Payer: BLUE CROSS/BLUE SHIELD | Admitting: Pharmacist

## 2017-04-21 DIAGNOSIS — E1140 Type 2 diabetes mellitus with diabetic neuropathy, unspecified: Secondary | ICD-10-CM | POA: Diagnosis not present

## 2017-04-21 NOTE — Progress Notes (Signed)
S: Destiny Hurley is a 67 y.o. female reports to clinical pharmacist appointment for CGM follow up. Patient did  bring medication bottles. Destiny Hurley also endorses some rashes since starting duloxetine for pain after her last visit at the clinic.  Allergies  Allergen Reactions  . Dulaglutide Nausea Only and Nausea And Vomiting   Prior to Admission medications   Medication Sig Start Date End Date Taking? Authorizing Provider  canagliflozin (INVOKANA) 300 MG TABS tablet Take 300 mg by mouth daily. 12/11/14   [provider]  gabapentin (NEURONTIN) 600 MG tablet Take 2 tablets (1,200 mg total) by mouth 2 (two) times daily. 04/08/17   Arnetha CourserAmin, Sumayya, MD  insulin glargine (LANTUS) 100 unit/mL SOPN Inject 0.2 mLs (20 Units total) into the skin at bedtime. 04/08/17   Arnetha CourserAmin, Sumayya, MD  Insulin Pen Needle 32G X 4 MM MISC 10 Units by Does not apply route daily. 04/01/17   Arnetha CourserAmin, Sumayya, MD  lisinopril (PRINIVIL,ZESTRIL) 10 MG tablet Take 1 tablet (10 mg total) by mouth daily. 04/01/17   Arnetha CourserAmin, Sumayya, MD  lovastatin (MEVACOR) 20 MG tablet Take 20 mg by mouth daily. 10/10/14   [provider]  metFORMIN (GLUCOPHAGE) 1000 MG tablet Take 1,000 mg by mouth 2 (two) times daily with a meal.     [provider]  sitaGLIPtin (JANUVIA) 100 MG tablet Take 1 tablet (100 mg total) by mouth daily. 04/16/17   Arnetha CourserAmin, Sumayya, MD   Past Medical History:  Diagnosis Date  . Diabetes mellitus   . Hyperlipemia   . Hypertension    A: Destiny Hurley was still wearing her CGM device, but the 14 days had expired on March 28th, so there were no new readings to upload. We were consulted to follow up on providing access to a CGM for home use, however the patient did not want the device and wanted to continue using her home glucometer. She also had not started taking the Januvia which was prescribed for her at her last visit.   During our visit, we discussed diet and lifestyle changes to help manage her  blood sugar. She states that she drinks 1-2 soda's daily, sweet tea, and likes to eat potatoes and some macaroni. We discussed the plate method, serving sizes, and other methods to try and limit carbohydrates. She verbalized understanding and wants to get her blood sugar under control. She has recently had several family members suffer from strokes and would like to become healthier.  Destiny Hurley also has several rashes which may have resulted from the duloxetine which was started at her last visit. Dr. Mikey BussingHoffman evaluated her rashes and his recommendations are included in the plan. She was against starting diphenhydramine because she works first and third shift and that would make her too fatigued.  P: Stop taking duloxetine (Cymbalta) and take claritin or zyrtec daily Start taking the Januvia which is at the pharmacy to pick up Keep taking your other medications as you have been Keep a log of you blood sugar (twice daily, once before breakfast, then two hours after lunch) Watch the carbohydrates and sugary drinks Follow up in two weeks (April 17th at 0945 w/MD, then Pharmacy)   An after visit summary was provided and patient advised to follow up in 2 weeks or sooner if any changes in condition or questions regarding medications arise.   The patient verbalized understanding of information provided by repeating back concepts discussed.   45 minutes spent face-to-face with the patient during the encounter.  50% of time spent on education. 50% of time was spent on discussion with Dr. Mikey Bussing, removal of CGM, discussion on medications, and documentation.   Nolen Mu PharmD PGY1 Pharmacy Practice Resident 04/21/2017 11:20 AM Pager: [phone]

## 2017-04-22 ENCOUNTER — Encounter (INDEPENDENT_AMBULATORY_CARE_PROVIDER_SITE_OTHER): Payer: BLUE CROSS/BLUE SHIELD | Admitting: Diagnostic Neuroimaging

## 2017-04-22 ENCOUNTER — Ambulatory Visit (INDEPENDENT_AMBULATORY_CARE_PROVIDER_SITE_OTHER): Payer: BLUE CROSS/BLUE SHIELD | Admitting: Diagnostic Neuroimaging

## 2017-04-22 DIAGNOSIS — Z0289 Encounter for other administrative examinations: Secondary | ICD-10-CM

## 2017-04-22 DIAGNOSIS — R200 Anesthesia of skin: Secondary | ICD-10-CM

## 2017-04-22 NOTE — Progress Notes (Signed)
Patient was seen in clinic with Austin Lucas, PharmD, PGY1 pharmacy resident. I agree with the assessment and plan of care documented.  

## 2017-04-23 NOTE — Procedures (Signed)
[facility]  NCS (NERVE CONDUCTION STUDY) WITH EMG (ELECTROMYOGRAPHY) REPORT   STUDY DATE: 04/22/17 PATIENT NAME: Destiny Hurley DOB: 04/29/1950 MRN: [id number]  ORDERING CLINICIAN: Arnetha CourserSumayya Amin, MD  TECHNOLOGIST: Charlesetta IvoryBeau Handy ELECTROMYOGRAPHER: Glenford BayleyVikram R. Autrey Human, MD  CLINICAL INFORMATION: 67 year old female with lower extremity numbness.  Hemoglobin A1c 10.9.   FINDINGS:  NERVE CONDUCTION STUDY: Right peroneal and right tibial motor responses have normal distal latencies, decreased amplitudes and slightly slow conduction velocity.  Left peroneal and left tibial motor responses have normal distal latencies, amplitudes, slightly slow conduction velocities.  Bilateral sural responses have decreased amplitudes and borderline peak latencies.  Bilateral superficial peroneal sensory responses could not be obtained.   NEEDLE ELECTROMYOGRAPHY: Right lower extremity gluteus medius, iliopsoas, vastus medialis, tibials anterior, gastrocnemius, right lumbar paraspinal muscles are normal.   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     INTERPRETING PHYSICIAN:  Suanne MarkerVIKRAM R. Angele Wiemann, MD Certified in Neurology, Neurophysiology and Neuroimaging  [facility] [address], [address] [place] [address] [phone]   [facility]    Nerve / Sites Muscle Latency Ref. Amplitude Ref. Rel Amp Segments Distance Velocity Ref. Area    ms ms mV mV %  cm m/s m/s mVms  R Peroneal - EDB     Ankle EDB 4.3 ?6.5 1.7 ?2.0 100 Ankle - EDB 9   5.1     Fib head EDB 11.9  1.6  90.7 Fib head - Ankle 32 42 ?44 4.2     Pop fossa EDB 14.2  1.6  104 Pop fossa - Fib head 10 43 ?44 4.4         Pop fossa - Ankle      L Peroneal - EDB     Ankle EDB 4.1 ?6.5 4.5 ?2.0 100 Ankle - EDB 9   13.8     Fib head EDB 11.7  3.9  86.7 Fib head - Ankle 32 42 ?44 12.1     Pop fossa EDB 14.2  3.3  86.3 Pop fossa - Fib head 10 40 ?44 10.0         Pop fossa - Ankle      R Tibial - AH     Ankle AH 5.2 ?5.8 2.6 ?4.0 100 Ankle - AH 9   6.6     Pop fossa AH 15.4  2.6  103 Pop fossa - Ankle 38 37 ?41 6.8  L Tibial - AH     Ankle AH 5.3 ?5.8 4.5 ?4.0 100 Ankle - AH 9   9.4     Pop fossa AH 15.2  1.9  41.9 Pop fossa - Ankle 38 38 ?41 4.0             SNC    Nerve / Sites Rec. Site Peak Lat Ref.  Amp Ref. Segments Distance    ms ms V V  cm  R Sural - Ankle (Calf)     Calf Ankle 4.4 ?4.4 2 ?6 Calf - Ankle 14  L Sural - Ankle (Calf)     Calf Ankle 4.5 ?4.4 1 ?6 Calf - Ankle 14  R Superficial peroneal - Ankle     Lat leg Ankle NR ?4.4 NR ?6 Lat leg - Ankle 14  L Superficial peroneal - Ankle     Lat leg Ankle NR ?4.4 NR ?6 Lat leg - Ankle 14              F  Wave  Nerve F Lat Ref.   ms ms  R Tibial - AH 59.3 ?56.0  L Tibial - AH 60.4 ?56.0         EMG full       EMG Summary Table    Spontaneous MUAP Recruitment  Muscle IA Fib PSW Fasc Other Amp Dur. Poly Pattern  R. Vastus medialis Normal None None None _______ Normal Normal Normal Normal  R. Tibialis anterior Normal None None None _______ Normal Normal Normal Normal  R. Gastrocnemius (Medial head) Normal None None None _______ Normal Normal Normal Normal  R. Iliopsoas Normal None None None _______ Normal Normal Normal Normal  R. Gluteus medius Normal None None None _______ Normal Normal Normal Normal  R. Lumbar paraspinals Normal None None None _______ Normal Normal Normal Normal

## 2017-05-04 ENCOUNTER — Encounter: Payer: Self-pay | Admitting: *Deleted

## 2017-05-05 ENCOUNTER — Ambulatory Visit: Payer: BLUE CROSS/BLUE SHIELD | Admitting: Certified Registered"

## 2017-05-05 ENCOUNTER — Ambulatory Visit
Admission: RE | Admit: 2017-05-05 | Discharge: 2017-05-05 | Disposition: A | Discharge status: Home / Self Care | Payer: BLUE CROSS/BLUE SHIELD | Source: Ambulatory Visit | Attending: Internal Medicine | Admitting: Internal Medicine

## 2017-05-05 ENCOUNTER — Other Ambulatory Visit: Payer: Self-pay

## 2017-05-05 ENCOUNTER — Encounter: Payer: Self-pay | Admitting: *Deleted

## 2017-05-05 ENCOUNTER — Encounter
Admission: RE | Disposition: A | Discharge status: Home / Self Care | Payer: Self-pay | Source: Ambulatory Visit | Attending: Internal Medicine

## 2017-05-05 ENCOUNTER — Ambulatory Visit: Payer: BLUE CROSS/BLUE SHIELD

## 2017-05-05 DIAGNOSIS — K640 First degree hemorrhoids: Secondary | ICD-10-CM | POA: Insufficient documentation

## 2017-05-05 DIAGNOSIS — E119 Type 2 diabetes mellitus without complications: Secondary | ICD-10-CM | POA: Insufficient documentation

## 2017-05-05 DIAGNOSIS — Z79891 Long term (current) use of opiate analgesic: Secondary | ICD-10-CM | POA: Insufficient documentation

## 2017-05-05 DIAGNOSIS — Z79899 Other long term (current) drug therapy: Secondary | ICD-10-CM | POA: Insufficient documentation

## 2017-05-05 DIAGNOSIS — Z791 Long term (current) use of non-steroidal anti-inflammatories (NSAID): Secondary | ICD-10-CM | POA: Insufficient documentation

## 2017-05-05 DIAGNOSIS — E785 Hyperlipidemia, unspecified: Secondary | ICD-10-CM | POA: Diagnosis not present

## 2017-05-05 DIAGNOSIS — Z800 Family history of malignant neoplasm of digestive organs: Secondary | ICD-10-CM | POA: Diagnosis not present

## 2017-05-05 DIAGNOSIS — K6389 Other specified diseases of intestine: Secondary | ICD-10-CM | POA: Insufficient documentation

## 2017-05-05 DIAGNOSIS — Z1211 Encounter for screening for malignant neoplasm of colon: Secondary | ICD-10-CM | POA: Insufficient documentation

## 2017-05-05 HISTORY — DX: Other specified disease of esophagus: K22.89

## 2017-05-05 HISTORY — DX: Other specified diseases of esophagus: K22.8

## 2017-05-05 HISTORY — DX: Depression, unspecified: F32.A

## 2017-05-05 HISTORY — DX: Disease of tongue, unspecified: K14.9

## 2017-05-05 HISTORY — DX: Cardiac murmur, unspecified: R01.1

## 2017-05-05 HISTORY — DX: Pain in left shoulder: M25.512

## 2017-05-05 HISTORY — PX: COLONOSCOPY WITH PROPOFOL: SHX5780

## 2017-05-05 HISTORY — DX: Type 2 diabetes mellitus with diabetic neuropathy, unspecified: E11.40

## 2017-05-05 HISTORY — DX: Other specified disorders of adrenal gland: E27.8

## 2017-05-05 HISTORY — DX: Major depressive disorder, single episode, unspecified: F32.9

## 2017-05-05 LAB — GLUCOSE, CAPILLARY: Glucose-Capillary: 119 mg/dL — ABNORMAL HIGH (ref 65–99)

## 2017-05-05 SURGERY — COLONOSCOPY WITH PROPOFOL
Anesthesia: General

## 2017-05-05 MED ORDER — SODIUM CHLORIDE 0.9 % IV SOLN
INTRAVENOUS | Status: DC
  Administered 2017-05-05: 08:00:00 via INTRAVENOUS

## 2017-05-05 MED ORDER — PROPOFOL 500 MG/50ML IV EMUL
INTRAVENOUS | Status: AC
  Filled 2017-05-05: qty 50

## 2017-05-05 MED ORDER — LIDOCAINE HCL (PF) 2 % IJ SOLN
INTRAMUSCULAR | Status: AC
  Filled 2017-05-05: qty 10

## 2017-05-05 MED ORDER — LIDOCAINE HCL (CARDIAC) 20 MG/ML IV SOLN
INTRAVENOUS | Status: DC | PRN
  Administered 2017-05-05 (×2): 20 mg via INTRATRACHEAL

## 2017-05-05 MED ORDER — PROPOFOL 10 MG/ML IV BOLUS
INTRAVENOUS | Status: AC
  Filled 2017-05-05: qty 20

## 2017-05-05 MED ORDER — PROPOFOL 10 MG/ML IV BOLUS
INTRAVENOUS | Status: DC | PRN
  Administered 2017-05-05 (×7): 50 mg via INTRAVENOUS

## 2017-05-05 NOTE — Op Note (Signed)
[facility] Patient Name: Destiny Hurley Procedure Date: 05/05/2017 8:02 AM MRN: [id number] Account #: [id number] Date of Birth: [birth year] Admit Type: Outpatient Age: 67 Room: [facility] ENDO ROOM 4 Gender: Female Note Status: Finalized Procedure:            Colonoscopy Indications:          Screening in patient at increased risk: Family history                        of 1st-degree relative with colorectal cancer Providers:            Boykin Nearing. Norma Fredrickson MD, MD Referring MD:         Arnetha Courser (Referring MD) Medicines:            Propofol per Anesthesia Complications:        No immediate complications. Procedure:            Pre-Anesthesia Assessment:                       - The risks and benefits of the procedure and the                        sedation options and risks were discussed with the                        patient. All questions were answered and informed                        consent was obtained.                       After obtaining informed consent, the colonoscope was                        passed under direct vision. Throughout the procedure,                        the patient's blood pressure, pulse, and oxygen                        saturations were monitored continuously. The                        Colonoscope was introduced through the anus and                        advanced to the the cecum, identified by appendiceal                        orifice and ileocecal valve. The colonoscopy was                        performed without difficulty. The patient tolerated the                        procedure well. The quality of the bowel preparation                        was good. The ileocecal valve, appendiceal orifice, and  rectum were photographed. Findings:      The perianal and digital rectal examinations were normal. Pertinent       negatives include normal sphincter tone and no palpable rectal lesions.      A  localized area of mildly erythematous mucosa was found in the sigmoid       colon. Biopsies were taken with a cold forceps for histology.      Non-bleeding internal hemorrhoids were found during retroflexion. The       hemorrhoids were Grade I (internal hemorrhoids that do not prolapse).      The exam was otherwise without abnormality. Impression:           - Erythematous mucosa in the sigmoid colon. Biopsied.                       - Non-bleeding internal hemorrhoids.                       - The examination was otherwise normal. Recommendation:       - Patient has a contact number available for                        emergencies. The signs and symptoms of potential                        delayed complications were discussed with the patient.                        Return to normal activities tomorrow. Written discharge                        instructions were provided to the patient.                       - Resume previous diet.                       - Continue present medications.                       - Await pathology results.                       - Repeat colonoscopy in 5 years for screening purposes.                       - Return to GI office PRN.                       - The findings and recommendations were discussed with                        the patient and their family. Procedure Code(s):    --- Professional ---                       [phone], Colonoscopy, flexible; with biopsy, single or                        multiple Diagnosis Code(s):    --- Professional ---  Z80.0, Family history of malignant neoplasm of                        digestive organs                       K63.89, Other specified diseases of intestine                       K64.0, First degree hemorrhoids CPT copyright 2017 American Medical Association. All rights reserved. The codes documented in this report are preliminary and upon coder review may  be revised to meet current compliance  requirements. Stanton Kidneyeodoro K Toledo MD, MD 05/05/2017 8:24:30 AM This report has been signed electronically. Number of Addenda: 0 Note Initiated On: 05/05/2017 8:02 AM Scope Withdrawal Time: 0 hours 6 minutes 4 seconds  Total Procedure Duration: 0 hours 11 minutes 1 second       [facility]

## 2017-05-05 NOTE — Interval H&P Note (Signed)
History and Physical Interval Note:  05/05/2017 8:03 AM  Destiny Hurley  has presented today for surgery, with the diagnosis of SCREENING  The various methods of treatment have been discussed with the patient and family. After consideration of risks, benefits and other options for treatment, the patient has consented to  Procedure(s): COLONOSCOPY WITH PROPOFOL (N/A) as a surgical intervention .  The patient's history has been reviewed, patient examined, no change in status, stable for surgery.  I have reviewed the patient's chart and labs.  Questions were answered to the patient's satisfaction.     Chisholmoledo, Low[place]odoro

## 2017-05-05 NOTE — H&P (Signed)
Outpatient short stay form Pre-procedure 05/05/2017 8:01 AM Destiny Destiny Hurley. Destiny Destiny, [name]  Primary Physician: Arnetha CourserSumayya Hurley, [name]  Reason for visit:  Family history of colon cancer, Mother  History of present illness: 67 year old African-American female presents for intermittent diarrhea as well as a family history of colon cancer in her mother.  Diarrhea has for the most part resolved.  There is no rectal bleeding.    Current Facility-Administered Medications:  .  0.9 %  sodium chloride infusion, , Intravenous, Continuous, Destiny Destiny, Destiny Nearingeodoro K, MD, Last Rate: 20 mL/hr at 05/05/17 0731  Medications Prior to Admission  Medication Sig Dispense Refill Last Dose  . canagliflozin (INVOKANA) 300 MG TABS tablet Take 300 mg by mouth daily.   Past Week at Unknown time  . cyclobenzaprine (FLEXERIL) 5 MG tablet Take 5 mg by mouth 3 (three) times daily.   Past Week at Unknown time  . gabapentin (NEURONTIN) 600 MG tablet Take 2 tablets (1,200 mg total) by mouth 2 (two) times daily. 120 tablet 5 Past Week at Unknown time  . HYDROcodone-acetaminophen (NORCO/VICODIN) 5-325 MG tablet Take 1 tablet by mouth every 6 (six) hours as needed for moderate pain.   Past Week at Unknown time  . insulin glargine (LANTUS) 100 unit/mL SOPN Inject 0.2 mLs (20 Units total) into the skin at bedtime. 15 mL 11 Past Week at Unknown time  . insulin lispro (HUMALOG) 100 UNIT/ML injection Inject into the skin 3 (three) times daily before meals.   Past Week at Unknown time  . Insulin Destiny Destiny 32G X 4 MM MISC 10 Units by Does not apply route daily. 100 each 3 Past Week at Unknown time  . lisinopril (PRINIVIL,ZESTRIL) 10 MG tablet Take 1 tablet (10 mg total) by mouth daily. 90 tablet 2 Past Week at Unknown time  . lovastatin (MEVACOR) 20 MG tablet Take 20 mg by mouth daily.   Past Week at Unknown time  . metFORMIN (GLUCOPHAGE) 1000 MG tablet Take 1,000 mg by mouth 2 (two) times daily with a meal.    Past Week at Unknown time  . sitaGLIPtin  (JANUVIA) 100 MG tablet Take 1 tablet (100 mg total) by mouth daily. 30 tablet 2 Past Week at Unknown time  . traMADol (ULTRAM) 50 MG tablet Take by mouth every 6 (six) hours as needed.   Past Week at Unknown time     Allergies  Allergen Reactions  . Dulaglutide Nausea Only and Nausea And Vomiting     Past Medical History:  Diagnosis Date  . Adrenal mass (HCC) 2012   Destiny Destiny on CT  . Chronic painful diabetic neuropathy (HCC)   . Depression    situational  . Diabetes mellitus   . Dysplasia of tongue   . Esophageal mass   . Heart murmur    Echo 2/09 revealing aortic sclerosis without gradient  . Hyperlipemia   . Hypertension   . Left shoulder pain     Review of systems:   Negative   Physical Exam  Gen: Alert, oriented. Appears stated age.  HEENT: [place]/AT. PERRLA. Lungs: CTA, no wheezes. CV: RR nl S1, S2. Abd: soft, benign, no masses. BS+ Ext: No edema. Pulses 2+    Planned procedures: Colonoscopy.The patient understands the nature of the planned procedure, indications, risks, alternatives and potential complications including but not limited to bleeding, infection, perforation, damage to internal organs and possible oversedation/side effects from anesthesia. The patient agrees and gives consent to proceed.  Please refer to procedure notes for findings,  recommendations and patient disposition/instructions.    Destiny Destiny. Destiny Destiny, [name] Gastroenterology 05/05/2017  8:01 AM

## 2017-05-05 NOTE — Anesthesia Post-op Follow-up Note (Signed)
Anesthesia QCDR form completed.        

## 2017-05-05 NOTE — Transfer of Care (Signed)
Immediate Anesthesia Transfer of Care Note  Patient: Destiny Hurley  Procedure(s) Performed: COLONOSCOPY WITH PROPOFOL (N/A )  Patient Location: PACU  Anesthesia Type:General  Level of Consciousness: drowsy and patient cooperative  Airway & Oxygen Therapy: Patient Spontanous Breathing  Post-op Assessment: Report given to RN, Post -op Vital signs reviewed and stable and Patient moving all extremities  Post vital signs: Reviewed and stable  Last Vitals:  Vitals Value Taken Time  BP 122/68 05/05/2017  8:25 AM  Temp 36.4 C 05/05/2017  8:24 AM  Pulse 86 05/05/2017  8:25 AM  Resp 23 05/05/2017  8:25 AM  SpO2 97 % 05/05/2017  8:25 AM  Vitals shown include unvalidated device data.  Last Pain:  Vitals:   05/05/17 0824  TempSrc: Tympanic  PainSc: Asleep         Complications: No apparent anesthesia complications

## 2017-05-05 NOTE — Anesthesia Postprocedure Evaluation (Signed)
Anesthesia Post Note  Patient: Destiny Hurley  Procedure(s) Performed: COLONOSCOPY WITH PROPOFOL (N/A )  Patient location during evaluation: Endoscopy Anesthesia Type: General Level of consciousness: awake and alert and oriented Pain management: pain level controlled Vital Signs Assessment: post-procedure vital signs reviewed and stable Respiratory status: spontaneous breathing, nonlabored ventilation and respiratory function stable Cardiovascular status: blood pressure returned to baseline and stable Postop Assessment: no signs of nausea or vomiting Anesthetic complications: no     Last Vitals:  Vitals:   05/05/17 0824 05/05/17 0854  BP: 122/68 137/66  Pulse:    Resp:    Temp: 36.4 C   SpO2:      Last Pain:  Vitals:   05/05/17 0854  TempSrc:   PainSc: 0-No pain                 Montravious Weigelt

## 2017-05-05 NOTE — Anesthesia Postprocedure Evaluation (Signed)
Anesthesia Post Note  Patient: Destiny Hurley  Procedure(s) Performed: COLONOSCOPY WITH PROPOFOL (N/A )  Patient location during evaluation: Endoscopy Anesthesia Type: General Level of consciousness: awake and alert, patient cooperative and oriented Pain management: satisfactory to patient Vital Signs Assessment: post-procedure vital signs reviewed and stable Respiratory status: spontaneous breathing and respiratory function stable Cardiovascular status: blood pressure returned to baseline and stable Postop Assessment: no headache, no backache, patient able to bend at knees, no apparent nausea or vomiting and adequate PO intake Anesthetic complications: no     Last Vitals:  Vitals:   05/05/17 0713 05/05/17 0824  BP: (!) 143/81 122/68  Pulse: 75   Resp: 18   Temp: 36.6 C 36.4 C  SpO2: 100%     Last Pain:  Vitals:   05/05/17 0824  TempSrc: Tympanic  PainSc: Asleep                 Catheryn BaconJosephine H Diantha Paxson

## 2017-05-05 NOTE — Anesthesia Preprocedure Evaluation (Signed)
Anesthesia Evaluation  Patient identified by MRN, date of birth, ID band Patient awake    Reviewed: Allergy & Precautions, NPO status , Patient's Chart, lab work & pertinent test results  History of Anesthesia Complications Negative for: history of anesthetic complications  Airway Mallampati: II  TM Distance: >3 FB Neck ROM: Full    Dental  (+) Edentulous Upper, Edentulous Lower   Pulmonary neg pulmonary ROS, neg sleep apnea, neg COPD,    breath sounds clear to auscultation- rhonchi (-) wheezing      Cardiovascular hypertension, Pt. on medications (-) CAD, (-) Past MI, (-) Cardiac Stents and (-) CABG  Rhythm:Regular Rate:Normal - Systolic murmurs and - Diastolic murmurs    Neuro/Psych PSYCHIATRIC DISORDERS Depression negative neurological ROS     GI/Hepatic negative GI ROS, Neg liver ROS,   Endo/Other  diabetes, Insulin Dependent  Renal/GU negative Renal ROS     Musculoskeletal negative musculoskeletal ROS (+)   Abdominal (+) + obese,   Peds  Hematology negative hematology ROS (+)   Anesthesia Other Findings Past Medical History: 2012: Adrenal mass (HCC)     Comment:  Khushi pen on CT No date: Chronic painful diabetic neuropathy (HCC) No date: Depression     Comment:  situational No date: Diabetes mellitus No date: Dysplasia of tongue No date: Esophageal mass No date: Heart murmur     Comment:  Echo 2/09 revealing aortic sclerosis without gradient No date: Hyperlipemia No date: Hypertension No date: Left shoulder pain   Reproductive/Obstetrics                             Anesthesia Physical Anesthesia Plan  ASA: II  Anesthesia Plan: General   Post-op Pain Management:    Induction: Intravenous  PONV Risk Score and Plan: 2 and Propofol infusion  Airway Management Planned: Natural Airway  Additional Equipment:   Intra-op Plan:   Post-operative Plan:   Informed  Consent: I have reviewed the patients History and Physical, chart, labs and discussed the procedure including the risks, benefits and alternatives for the proposed anesthesia with the patient or authorized representative who has indicated his/her understanding and acceptance.   Dental advisory given  Plan Discussed with: CRNA and Anesthesiologist  Anesthesia Plan Comments:         Anesthesia Quick Evaluation

## 2017-05-07 LAB — SURGICAL PATHOLOGY

## 2017-05-10 ENCOUNTER — Ambulatory Visit (INDEPENDENT_AMBULATORY_CARE_PROVIDER_SITE_OTHER): Payer: BLUE CROSS/BLUE SHIELD | Admitting: Internal Medicine

## 2017-05-10 ENCOUNTER — Encounter (INDEPENDENT_AMBULATORY_CARE_PROVIDER_SITE_OTHER): Payer: Self-pay

## 2017-05-10 DIAGNOSIS — R21 Rash and other nonspecific skin eruption: Secondary | ICD-10-CM

## 2017-05-10 DIAGNOSIS — M79604 Pain in right leg: Secondary | ICD-10-CM

## 2017-05-10 DIAGNOSIS — L270 Generalized skin eruption due to drugs and medicaments taken internally: Secondary | ICD-10-CM | POA: Diagnosis not present

## 2017-05-10 DIAGNOSIS — G8929 Other chronic pain: Secondary | ICD-10-CM

## 2017-05-10 DIAGNOSIS — T43215D Adverse effect of selective serotonin and norepinephrine reuptake inhibitors, subsequent encounter: Secondary | ICD-10-CM | POA: Diagnosis not present

## 2017-05-10 DIAGNOSIS — Z888 Allergy status to other drugs, medicaments and biological substances status: Secondary | ICD-10-CM

## 2017-05-10 DIAGNOSIS — E119 Type 2 diabetes mellitus without complications: Secondary | ICD-10-CM | POA: Diagnosis not present

## 2017-05-10 DIAGNOSIS — M79605 Pain in left leg: Secondary | ICD-10-CM

## 2017-05-10 DIAGNOSIS — I10 Essential (primary) hypertension: Secondary | ICD-10-CM | POA: Diagnosis not present

## 2017-05-10 NOTE — Assessment & Plan Note (Addendum)
Assessment & Plan: Destiny Hurley was started on Duloxetine several weeks ago for chronic BL leg pain. She developed a rash on her chest, arms and back within 5-7 days which resolved after holding this medication. She's had no recurrence and feels to be in her usual state of health. Suspect rash related to addition of Duloxetine given its onset within a few days of initiating therapy and resolution after holding.  -Apt with PCP 5/10 already scheduled, encouraged follow-up -Added Duloxetine to allergies list (rash)

## 2017-05-10 NOTE — Progress Notes (Signed)
   CC: follow-up of drug-rash  HPI:  Ms.Samiha Quita Skyehacker Dunivan is a 67 y.o. F with HTN, DM2, and chronic leg pain who presents today for follow-up of Duloxetine-associated drug rash.   For details regarding today's visit and the status of their chronic medical issues, please refer to the assessment and plan. She has no acute complaints.  Past Medical History:  Diagnosis Date  . Adrenal mass (HCC) 2012   Rebeka pen on CT  . Chronic painful diabetic neuropathy (HCC)   . Depression    situational  . Diabetes mellitus   . Dysplasia of tongue   . Esophageal mass   . Heart murmur    Echo 2/09 revealing aortic sclerosis without gradient  . Hyperlipemia   . Hypertension   . Left shoulder pain    Review of Systems:   General: Denies fevers, chills, rash HEENT: Denies acute changes in vision, sore throat Pulmonary: Denies cough, wheeze, feeling like throat is closing Abd: Denies abdominal pain, changes in bowels Extremities: Denies swelling, easy bruising.  Physical Exam: General: Alert, in no acute distress. Pleasant and conversant HEENT: No icterus or injection. No rash appreciated. No periorbital edema. Cardiac: RRR, no murmur Pulmonary: CTA BL with normal WOB on RA. Able to speak in complete sentences Abd: Soft, non-tender. +bs Extremities: Warm, perfused. No significant pedal edema. No rash.  Vitals:   05/10/17 1036  BP: 122/63  Pulse: 80  Temp: 98 F (36.7 C)  TempSrc: Oral  SpO2: 100%  Weight: 187 lb 9.6 oz (85.1 kg)  Height: 5\' 5"  (1.651 m)   Body mass index is 31.22 kg/m.  Assessment & Plan:   See Encounters Tab for problem based charting.  Patient discussed with Dr. Josem KaufmannKlima

## 2017-05-10 NOTE — Addendum Note (Signed)
Addended by: Doneen PoissonKLIMA, Lavone Weisel D on: 05/10/2017 05:05 PM   Modules accepted: Level of Service

## 2017-05-10 NOTE — Patient Instructions (Signed)
It was great seeing you today! I'm glad you are feeling well. I'm glad the rash resolved when you stopped taking Duloxetine. I will add this to your allergies.   Please follow-up in clinic with Dr. Nelson ChimesAmin at your appointment 5/10. We are happy to see you sooner should any issues arise

## 2017-05-10 NOTE — Progress Notes (Signed)
Patient ID: Destiny Hurley, female   DOB: 02/28/1950, 67 y.o.   MRN: [id number]  Case discussed with Dr. Vincente LibertyMolt at the time of the visit.  We reviewed the resident's history and exam and pertinent patient test results.  I agree with the assessment, diagnosis and plan of care documented in the resident's note.

## 2017-05-18 ENCOUNTER — Other Ambulatory Visit: Payer: Self-pay | Admitting: Internal Medicine

## 2017-05-18 MED ORDER — LOVASTATIN 20 MG PO TABS: 20.0000 mg | ORAL_TABLET | Freq: Every day | ORAL | 1 refills | Status: DC

## 2017-05-18 NOTE — Telephone Encounter (Signed)
Patient is requesting refills on choles medicine, pls send to [facility] in [place] pharmacy

## 2017-05-28 ENCOUNTER — Encounter: Payer: BLUE CROSS/BLUE SHIELD | Admitting: Internal Medicine

## 2017-06-11 ENCOUNTER — Ambulatory Visit (INDEPENDENT_AMBULATORY_CARE_PROVIDER_SITE_OTHER): Payer: BLUE CROSS/BLUE SHIELD | Admitting: Internal Medicine

## 2017-06-11 ENCOUNTER — Other Ambulatory Visit: Payer: Self-pay

## 2017-06-11 ENCOUNTER — Encounter: Payer: Self-pay | Admitting: Internal Medicine

## 2017-06-11 VITALS — BP 120/50 | HR 96 | Temp 98.7°F | Ht 65.0 in | Wt 181.5 lb

## 2017-06-11 DIAGNOSIS — M6281 Muscle weakness (generalized): Secondary | ICD-10-CM

## 2017-06-11 DIAGNOSIS — I10 Essential (primary) hypertension: Secondary | ICD-10-CM

## 2017-06-11 DIAGNOSIS — M79652 Pain in left thigh: Secondary | ICD-10-CM | POA: Diagnosis not present

## 2017-06-11 DIAGNOSIS — M6289 Other specified disorders of muscle: Secondary | ICD-10-CM

## 2017-06-11 DIAGNOSIS — Z794 Long term (current) use of insulin: Secondary | ICD-10-CM

## 2017-06-11 DIAGNOSIS — R634 Abnormal weight loss: Secondary | ICD-10-CM

## 2017-06-11 DIAGNOSIS — E1140 Type 2 diabetes mellitus with diabetic neuropathy, unspecified: Secondary | ICD-10-CM

## 2017-06-11 LAB — GLUCOSE, CAPILLARY: Glucose-Capillary: 292 mg/dL — ABNORMAL HIGH (ref 65–99)

## 2017-06-11 MED ORDER — KETOROLAC TROMETHAMINE 60 MG/2ML IM SOLN
60.0000 mg | Freq: Once | INTRAMUSCULAR | Status: AC
  Administered 2017-06-11: 60 mg via INTRAMUSCULAR

## 2017-06-11 MED ORDER — SITAGLIP PHOS-METFORMIN HCL ER 50-1000 MG PO TB24: 1.0000 | ORAL_TABLET | Freq: Two times a day (BID) | ORAL | 11 refills | Status: DC

## 2017-06-11 MED ORDER — INSULIN GLARGINE 100 UNITS/ML SOLOSTAR PEN: 20.0000 [IU] | PEN_INJECTOR | Freq: Every day | SUBCUTANEOUS | 11 refills | Status: DC

## 2017-06-11 MED ORDER — PREGABALIN 75 MG PO CAPS: 75.0000 mg | ORAL_CAPSULE | Freq: Three times a day (TID) | ORAL | 2 refills | Status: DC

## 2017-06-11 NOTE — Patient Instructions (Addendum)
Thank you for visiting clinic today. We are making some changes according to the coupon availability for your medication. You will stop taking Metformin, Januvia and gabapentin. Now you will start taking Janumet XR twice a day. Continue Invokana. I am also starting you on Lyrica 75 mg twice a day initially then increase it to 3 times a day.  We will increase the dose during next follow-up visit if needed. Continue taking Lantus 20 units at bedtime as with this coupon you can get it free. I am also checking some labs, will call you with any abnormal results. I am also giving you a referral to see a neurologist for further investigation and management of this pain and weakness. Please follow-up in 1 month bring your glucometer with you.

## 2017-06-11 NOTE — Assessment & Plan Note (Signed)
Although she has pain in both thighs but left thigh is very worse than right. Pain is interfering with all her ADLs. She has a characteristic of an neuropathic pain with nerve conduction studies showing sensorimotor polyneuropathy. 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 Her gabapentin was increased to 1200 mg twice daily with no relief. Her CK level was within normal limit, ESR was normal and CRP was elevated.  -We will recheck her basic labs of CBC, CMP, aldolase, CRP, vitamin B12 and vitamin D levels. -Referral for neurology was provided. -She was given Toradol 60 mg in the clinic.

## 2017-06-11 NOTE — Assessment & Plan Note (Signed)
BP Readings from Last 3 Encounters:  06/11/17 (!) 120/50  05/10/17 122/63  05/05/17 137/66   She was normotensive on lisinopril 10 mg daily.  Continue current management.

## 2017-06-11 NOTE — Assessment & Plan Note (Signed)
She continued to lose weight, her weight today was 181 pound, it was 187 pounds on May 10, 2017.  Weight loss is common diabetic amyotrophy. Although she denies any smoking we will check chest x-ray to rule out any paraneoplastic component of her symptoms.

## 2017-06-11 NOTE — Progress Notes (Signed)
   CC: Follow-up of her diabetes and bilateral thigh pain.  HPI:  Destiny Hurley is a 67 y.o. with past medical history as listed below came for the follow-up of her diabetes and type pain.  She was complaining of worsening left thigh pain and weakness, continued to lose weight and very frustrated with the poor control of pain.  According to patient she is unable to sleep well because of this pain and nothing is helping, she is on gabapentin 1200 mg twice daily, recently increased which did not affect her pain.  She was unable to tolerate duloxetine as it causes a rash.  She has tried Norco and tramadol with no relief.  She just wants some shot to give her relief.  Recent nerve conduction studies shows sensorimotor polyneuropathy.  According to patient pain is interfering with her ADL and it is becoming more difficult for her to walk and stand from a sitting position.  She denies smoking.  Please see assessment and plan for her chronic conditions.  Past Medical History:  Diagnosis Date  . Adrenal mass (HCC) 2012   Melora pen on CT  . Chronic painful diabetic neuropathy (HCC)   . Depression    situational  . Diabetes mellitus   . Dysplasia of tongue   . Esophageal mass   . Heart murmur    Echo 2/09 revealing aortic sclerosis without gradient  . Hyperlipemia   . Hypertension   . Left shoulder pain    Review of Systems: Negative except mentioned in HPI.  Physical Exam:  Vitals:   06/11/17 1550  BP: (!) 120/50  Pulse: 96  Temp: 98.7 F (37.1 C)  TempSrc: Oral  SpO2: 100%  Weight: 181 lb 8 oz (82.3 kg)  Height:  (1.651 m)    General: Vital signs reviewed.  Patient is well-developed and well-nourished, in no acute distress and cooperative with exam.  Head: Normocephalic and atraumatic. Eyes: EOMI, conjunctivae normal, no scleral icterus.  Cardiovascular: RRR, S1 normal, S2 normal, no murmurs, gallops, or rubs. Pulmonary/Chest: Clear to auscultation bilaterally,  no wheezes, rales, or rhonchi. Abdominal: Soft, non-tender, non-distended, BS +, no masses, organomegaly, or guarding present.  Musculoskeletal: No muscle tenderness, proximal thigh muscle wasting.  Normal range of motion.. Extremities: No lower extremity edema bilaterally,  pulses symmetric and intact bilaterally. No cyanosis or clubbing. Neurological: A&O x3, 3/5 at left eye and 4/5 at right thigh, rest of the strength 5/5 bilaterally and symmetrical. Skin: Warm, dry and intact. No rashes or erythema. Psychiatric: Appears frustrated.  Assessment & Plan:   See Encounters Tab for problem based charting.  Patient discussed with Dr. Cyndie Chime.

## 2017-06-11 NOTE — Assessment & Plan Note (Signed)
She continued to experience worsening proximal muscle weakness in her thighs along with decreasing muscle mass. She continued to lose weight.  Symptoms are more consistent with diabetic amyotrophy.  Her CK and ESR was within normal limit and CRP was elevated.  We will check basic labs along with repeat CRP, aldolase and chest x-ray for proximal muscle weakness.  She was provided with a referral to see neurology.

## 2017-06-11 NOTE — Assessment & Plan Note (Signed)
She has uncontrolled diabetes with last A1c done in March was 10.9. She did bring her glucometer which shows average glucose reading of 146, remained within target 29%, no hypoglycemic event.  Appears much improved as compared to her previous reading.  Patient was placed on Lantus 20 units at bedtime and Januvia was added to her previous regimen of metformin 1000 mg twice daily and Invokana. According to patient she cannot afford Lantus and Januvia as each caused her dollar 100 each month.  Her CBG in the clinic was 292 but she just ate a corn bread along with a bowl of pasta before coming to the clinic.  With the help of Dr. Selena Batten I was able to find a coupon for Janumet XR 50-1000  for $5. Coupon for Lantus was also provided-she should be able to get her Lantus for free. She gets her Invokana for 3. Patient agreed to continue all those medications with this cost.  Please see assessment and plan of thigh pain for her neuropathy.

## 2017-06-13 LAB — CMP14 + ANION GAP
A/G RATIO: 1.9 (ref 1.2–2.2)
ALBUMIN: 5 g/dL — AB (ref 3.6–4.8)
ALK PHOS: 105 IU/L (ref 39–117)
ALT: 14 IU/L (ref 0–32)
AST: 12 IU/L (ref 0–40)
Anion Gap: 16 mmol/L (ref 10.0–18.0)
BUN / CREAT RATIO: 16 (ref 12–28)
BUN: 16 mg/dL (ref 8–27)
CHLORIDE: 100 mmol/L (ref 96–106)
CO2: 25 mmol/L (ref 20–29)
Calcium: 10.4 mg/dL — ABNORMAL HIGH (ref 8.7–10.3)
Creatinine, Ser: 0.99 mg/dL (ref 0.57–1.00)
GFR calc Af Amer: 70 mL/min/{1.73_m2} (ref >59)
GFR calc non Af Amer: 60 mL/min/{1.73_m2} (ref >59)
GLOBULIN, TOTAL: 2.6 g/dL (ref 1.5–4.5)
Glucose: 265 mg/dL — ABNORMAL HIGH (ref 65–99)
Potassium: 4.2 mmol/L (ref 3.5–5.2)
SODIUM: 141 mmol/L (ref 134–144)
TOTAL PROTEIN: 7.6 g/dL (ref 6.0–8.5)

## 2017-06-13 LAB — CBC
HEMATOCRIT: 45.1 % (ref 34.0–46.6)
Hemoglobin: 14.6 g/dL (ref 11.1–15.9)
MCH: 28.8 pg (ref 26.6–33.0)
MCHC: 32.4 g/dL (ref 31.5–35.7)
MCV: 89 fL (ref 79–97)
PLATELETS: 439 10*3/uL (ref 150–450)
RBC: 5.07 x10E6/uL (ref 3.77–5.28)
RDW: 13.6 % (ref 12.3–15.4)
WBC: 7.8 10*3/uL (ref 3.4–10.8)

## 2017-06-13 LAB — LACTATE DEHYDROGENASE: LDH: 170 IU/L (ref 119–226)

## 2017-06-13 LAB — VITAMIN D 25 HYDROXY (VIT D DEFICIENCY, FRACTURES): VIT D 25 HYDROXY: 15.8 ng/mL — AB (ref 30.0–100.0)

## 2017-06-13 LAB — C-REACTIVE PROTEIN: CRP: 12.3 mg/L — AB (ref 0.0–4.9)

## 2017-06-13 LAB — ALDOLASE: Aldolase: 3.9 U/L (ref 3.3–10.3)

## 2017-06-13 LAB — VITAMIN B12: VITAMIN B 12: 510 pg/mL (ref 232–1245)

## 2017-06-15 NOTE — Progress Notes (Signed)
Medicine attending: Medical history, presenting problems, physical findings, and medications, reviewed with resident physician Dr Arnetha Courser on the day of the patient visit and I concur with her evaluation and management plan. Complex situation. Proximal muscle weakness, chronic pain. 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 Recent EMG w polyneuropathy. In view of weight loss, need to consider paraneoplastic syndrome such as Pincus Badder syndrome, in differential. We will get a CXR. CK normal. Check aldolase. Neurology referral. Short interval follow up. Trial of tramadol.

## 2017-06-17 ENCOUNTER — Encounter: Payer: Self-pay | Admitting: Pharmacist

## 2017-06-17 NOTE — Progress Notes (Signed)
Spoke to [employer], patient was able to pick up Lyrica, Janumet, and Lantus using copay cards.

## 2017-06-17 NOTE — Addendum Note (Signed)
Addended by: Neomia Dear on: 06/17/2017 07:07 PM   Modules accepted: Orders

## 2017-07-08 ENCOUNTER — Ambulatory Visit (INDEPENDENT_AMBULATORY_CARE_PROVIDER_SITE_OTHER): Payer: BLUE CROSS/BLUE SHIELD | Admitting: Internal Medicine

## 2017-07-08 ENCOUNTER — Other Ambulatory Visit: Payer: Self-pay

## 2017-07-08 ENCOUNTER — Encounter: Payer: Self-pay | Admitting: Internal Medicine

## 2017-07-08 DIAGNOSIS — Z79899 Other long term (current) drug therapy: Secondary | ICD-10-CM

## 2017-07-08 DIAGNOSIS — I10 Essential (primary) hypertension: Secondary | ICD-10-CM

## 2017-07-08 DIAGNOSIS — E119 Type 2 diabetes mellitus without complications: Secondary | ICD-10-CM

## 2017-07-08 DIAGNOSIS — R300 Dysuria: Secondary | ICD-10-CM | POA: Insufficient documentation

## 2017-07-08 DIAGNOSIS — M7989 Other specified soft tissue disorders: Secondary | ICD-10-CM | POA: Insufficient documentation

## 2017-07-08 LAB — POCT URINALYSIS DIPSTICK
Bilirubin, UA: NEGATIVE
Blood, UA: NEGATIVE
Glucose, UA: NEGATIVE
Ketones, UA: NEGATIVE
LEUKOCYTES UA: NEGATIVE
Nitrite, UA: NEGATIVE
PH UA: 5.5 (ref 5.0–8.0)
PROTEIN UA: POSITIVE — AB
UROBILINOGEN UA: 0.2 U/dL

## 2017-07-08 MED ORDER — FLUCONAZOLE 150 MG PO TABS: 150.0000 mg | ORAL_TABLET | Freq: Once | ORAL | 0 refills | Status: AC

## 2017-07-08 MED ORDER — GABAPENTIN 600 MG PO TABS: 1200.0000 mg | ORAL_TABLET | Freq: Two times a day (BID) | ORAL | 2 refills | Status: DC

## 2017-07-08 NOTE — Progress Notes (Signed)
Medicine attending: Medical history, presenting problems, physical findings, and medications, reviewed with resident physician Dr Vishal Patel on the day of the patient visit and I concur with his evaluation and management plan. 

## 2017-07-08 NOTE — Assessment & Plan Note (Addendum)
Patient reports about 1 week of burning with urination.  She feels like this she has a yeast infection secondary to her Lyrica.  She denies any vaginal discharge or rash. A/P: We will check urine dipstick to assess for UTI.  If negative we will treat empirically for vulvovaginal candidiasis with fluconazole 150 mg once.  Urine dipstick is negative for nitrites or leukocytes but positive for protein.  Will treat empirically with fluconazole as above.  Recommend repeat UA follow-up with microalbumin/creatinine ratio in this diabetic patient.

## 2017-07-08 NOTE — Assessment & Plan Note (Signed)
Patient reports about 3 weeks of swelling in her right foot which she noticed began after she started Lyrica.  Swelling usually stays in the foot and she has noticed that her shoes are fitting tighter.  She has noticed some swelling in her left foot but less pronounced.  She denies any injury to the feet.  She denies any dyspnea, orthopnea, chest pain, or palpitations.  She denies any calf pain. A/P: Patient with slight nonpitting edema of her right foot compared to her left.  She has good dorsalis pedis and posterior tibialis pulses.  Range of motion of the ankle is intact as is strength.  She has no swelling of her calf, erythema, or pain with palpation of the calf.  Per up-to-date, 4-16% of patients on Lyrica can experience peripheral edema.  We will switch her back to gabapentin which he says was providing some benefit and have her follow-up with her PCP on 07/26/2017 as scheduled for reevaluation.

## 2017-07-08 NOTE — Patient Instructions (Addendum)
It was a pleasure to see you Destiny Hurley.  Please stop Lyrica and restart Gabapentin 1200 mg twice daily.  I am prescribing Fluconazole 150 mg once to treat for possible yeast infection.  Please follow up with Dr. Nelson ChimesAmin on 07/26/17 as scheduled.

## 2017-07-08 NOTE — Progress Notes (Signed)
   CC: Right foot swelling  HPI:  [name] is a 67 y.o. female with PMH as listed below including T2DM and HTN who presents for evaluation of right foot swelling.  Please see problem based charting for status of patient's chronic medical issues.  Past Medical History:  Diagnosis Date  . Adrenal mass (HCC) 2012   Kaislee pen on CT  . Chronic painful diabetic neuropathy (HCC)   . Depression    situational  . Diabetes mellitus   . Dysplasia of tongue   . Esophageal mass   . Heart murmur    Echo 2/09 revealing aortic sclerosis without gradient  . Hyperlipemia   . Hypertension   . Left shoulder pain    Review of Systems:   Review of Systems  Constitutional: Negative for chills, diaphoresis and fever.  Respiratory: Negative for shortness of breath.   Cardiovascular: Negative for chest pain, palpitations and orthopnea.       Right foot swelling  Genitourinary: Positive for dysuria.       No vaginal discharge or rash  Musculoskeletal: Negative for falls.     Physical Exam:  Vitals:   07/08/17 1322  BP: (!) 155/72  Pulse: 89  Temp: 98.7 F (37.1 C)  TempSrc: Oral  SpO2: 98%  Weight: 186 lb 9.6 oz (84.6 kg)  Height: 5\' 5"  (1.651 m)   Physical Exam  Constitutional: She is oriented to person, place, and time. She appears well-developed and well-nourished. No distress.  HENT:  Head: Normocephalic and atraumatic.  Cardiovascular: Normal rate, regular rhythm and intact distal pulses.  No murmur heard. Pulmonary/Chest: Effort normal. No respiratory distress. She has no wheezes. She has no rales.  Musculoskeletal:  Slight nonpitting edema of the right foot.  Range of motion strength at the right ankle is intact.  DP and PT pulses on the right are intact.  Neurological: She is alert and oriented to person, place, and time.  Skin: She is not diaphoretic.     Assessment & Plan:   See Encounters Tab for problem based charting.  Patient discussed with Dr.  Cyndie ChimeGranfortuna

## 2017-07-26 ENCOUNTER — Encounter: Payer: BLUE CROSS/BLUE SHIELD | Admitting: Internal Medicine

## 2017-07-28 DIAGNOSIS — M79605 Pain in left leg: Secondary | ICD-10-CM | POA: Diagnosis not present

## 2017-07-29 ENCOUNTER — Other Ambulatory Visit: Payer: Self-pay

## 2017-07-29 ENCOUNTER — Ambulatory Visit (INDEPENDENT_AMBULATORY_CARE_PROVIDER_SITE_OTHER): Payer: Medicare HMO | Admitting: Family Medicine

## 2017-07-29 ENCOUNTER — Encounter: Payer: Self-pay | Admitting: Family Medicine

## 2017-07-29 VITALS — BP 138/64 | HR 90 | Temp 98.4°F | Resp 16 | Ht 63.78 in | Wt 176.0 lb

## 2017-07-29 DIAGNOSIS — Z794 Long term (current) use of insulin: Secondary | ICD-10-CM

## 2017-07-29 DIAGNOSIS — E1140 Type 2 diabetes mellitus with diabetic neuropathy, unspecified: Secondary | ICD-10-CM | POA: Diagnosis not present

## 2017-07-29 DIAGNOSIS — Z0000 Encounter for general adult medical examination without abnormal findings: Secondary | ICD-10-CM

## 2017-07-29 DIAGNOSIS — R109 Unspecified abdominal pain: Secondary | ICD-10-CM

## 2017-07-29 DIAGNOSIS — Z7689 Persons encountering health services in other specified circumstances: Secondary | ICD-10-CM

## 2017-07-29 DIAGNOSIS — R079 Chest pain, unspecified: Secondary | ICD-10-CM | POA: Diagnosis not present

## 2017-07-29 LAB — URINALYSIS, ROUTINE W REFLEX MICROSCOPIC
Bacteria, UA: NONE SEEN /HPF
Bilirubin Urine: NEGATIVE
Hgb urine dipstick: NEGATIVE
KETONES UR: NEGATIVE
Leukocytes, UA: NEGATIVE
NITRITE: NEGATIVE
RBC / HPF: NONE SEEN /HPF (ref 0–2)
SPECIFIC GRAVITY, URINE: 1.02 (ref 1.001–1.03)
WBC, UA: NONE SEEN /HPF (ref 0–5)
pH: 5.5 (ref 5.0–8.0)

## 2017-07-29 LAB — MICROSCOPIC MESSAGE

## 2017-07-29 MED ORDER — PANTOPRAZOLE SODIUM 20 MG PO TBEC: 20.0000 mg | DELAYED_RELEASE_TABLET | Freq: Every day | ORAL | 0 refills | Status: DC

## 2017-07-29 NOTE — Progress Notes (Signed)
Patient ID: Destiny Hurley, female    DOB: [birth year], 67 y.o.   MRN: [id number]  PCP: Delsa Grana, PA-C  Chief Complaint  Patient presents with  . New Patient (Initial Visit)    is fasting  . Abdominal Pain    states that she has constant pain in B sides that radiates to upper ABD- states that pain is so severe that she can't rest    Subjective:   Destiny Hurley is a 67 y.o. female, presents to clinic with CC of severe abdominal pain, also is new pt here, but hx is very complex and confusing, possibly at least 2 recent PCP's in last 2 months.  Pt difficult historian as well.  Abdominal Pain  This is a chronic problem. The current episode started more than 1 month ago (October 2018). The onset quality is gradual, started after some change in medications for diabetes, but specifically she states when she started trulicity injection, and although she stopped taking the medicine her sx continued. The problem occurs constantly. The most recent episode lasted 9 months. The pain is located in the LUQ, RUQ and epigastric region. The pain is at a severity of 10/10. The pain is severe. The quality of the pain is sharp and cramping. The abdominal pain radiates to the epigastric region, chest, periumbilical region and RUQ (pain radiates everywhere - to bilateral sides, to epigastric area and central chest and up and down central abdomen, no radiation to back). Associated symptoms include weight loss. Pertinent negatives include no anorexia, belching, constipation, diarrhea, dysuria, fever, flatus, headaches, hematochezia, hematuria, melena, nausea or vomiting. The pain is aggravated by eating. The pain is relieved by nothing. She has tried acetaminophen and antacids for the symptoms. Prior diagnostic workup includes lower endoscopy. Her past medical history is significant for abdominal surgery, gallstones, GERD, irritable bowel syndrome and PUD. There is no history of colon cancer, Crohn's  disease, pancreatitis or ulcerative colitis.  She did loose weight last year when working on diabetes management with group therapy and change in medications however this was expected and monitored, no recent or unintentional weight gain or loss. Patient also points to her bilateral ribs, sides and epigastric area stating that the pain is located all throughout lower chest as well as upper abdomen.  All pain symptoms are described the same.  She denies any inspiratory pain, shortness of breath, palpitations, chest pain or pressure, near-syncope, exertional shortness of breath, lower extremity edema, PND, orthopnea.   Pt notes using excessive amounts of tylenol for abdominal pain and asks for other pain management options.  She states she was taking 3 500 mg tylenol tablets every 2-3 hours, but 2 weeks ago she "backed off" and now is using less, for example yesterday took 1500 mg of tylenol in the morning and once at night.    Chart review shows only colonoscopy, screening for CRC, no upper endoscopy done.  Left colon cold biopsy done with 05/05/17 colonoscopy, pathology resulted with mild edema and negative for colitis and negative for dysplasia  Pt is also new to establish care here, recently got new medicare insurance 07/19/17.  Previously had care from [facility] clinic - internal medicine, Dr. Caryl Comes, and [facility] internal medicine clinic seeing Dr. Reesa Chew.    She reports hx of chronic pain to her legs that she has gone to several doctors for but she reports that she just keeps getting "ignored" and they "always say its diabetes."   Per chart review of [facility]  internal medicine visit 06/11/17 for leg pain, this had extensive workup see copied A&P from Dr. Beryle Beams attending physician  Author: Annia Belt, MD Author Type: Physician Filed: 06/15/2017 7:59 AM  Note Status: Signed Cosign: Cosign Not Required Encounter Date: 06/11/2017  Editor: Annia Belt, MD (Physician)     Medicine  attending: Medical history, presenting problems, physical findings, and medications, reviewed with resident physician Dr Lorella Nimrod on the day of the patient visit and I concur with her evaluation and management plan. Complex situation. Proximal muscle weakness, chronic pain. 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 Recent EMG w polyneuropathy. In view of weight loss, need to consider paraneoplastic syndrome such as Rita Ohara syndrome, in differential. We will get a CXR. CK normal. Check aldolase. Neurology referral. Short interval follow up. Trial of tramadol.      And A&P by Dr. Reesa Chew Assessment & Plan Note by Lorella Nimrod, MD at 06/11/2017 5:28 PM   Author: Lorella Nimrod, MD Author Type: Resident Filed: 06/11/2017 5:30 PM  Note Status: Written Cosign: Cosign Not Required Encounter Date: 06/11/2017  Problem: Weight loss  Editor: Lorella Nimrod, MD (Resident)    She continued to lose weight, her weight today was 181 pound, it was 187 pounds on May 10, 2017.  Weight loss is common diabetic amyotrophy. Although she denies any smoking we will check chest x-ray to rule out any paraneoplastic component of her symptoms.       Assessment & Plan Note by Lorella Nimrod, MD at 06/11/2017 5:27 PM   Author: Lorella Nimrod, MD Author Type: Resident Filed: 06/11/2017 5:28 PM  Note Status: Written Cosign: Cosign Not Required Encounter Date: 06/11/2017  Problem: Proximal limb muscle weakness  Editor: Lorella Nimrod, MD (Resident)    She continued to experience worsening proximal muscle weakness in her thighs along with decreasing muscle mass. She continued to lose weight.  Symptoms are more consistent with diabetic amyotrophy.  Her CK and ESR was within normal limit and CRP was elevated.  We will check basic labs along with repeat CRP, aldolase and chest x-ray for proximal muscle weakness.  She was provided with a referral to see neurology.       Assessment & Plan Note by Lorella Nimrod, MD  at 06/11/2017 5:24 PM   Author: Lorella Nimrod, MD Author Type: Resident Filed: 06/11/2017 5:27 PM  Note Status: Written Cosign: Cosign Not Required Encounter Date: 06/11/2017  Problem: Left thigh pain  Editor: Lorella Nimrod, MD (Resident)    Although she has pain in both thighs but left thigh is very worse than right. Pain is interfering with all her ADLs. She has a characteristic of an neuropathic pain with nerve conduction studies showing sensorimotor polyneuropathy. 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 Her gabapentin was increased to 1200 mg twice daily with no relief. Her CK level was within normal limit, ESR was normal and CRP was elevated.  -We will recheck her basic labs of CBC, CMP, aldolase, CRP, vitamin B12 and vitamin D levels. -Referral for neurology was provided. -She was given Toradol 60 mg in the clinic.       Assessment & Plan Note by Lorella Nimrod, MD at 06/11/2017 5:20 PM   Author: Lorella Nimrod, MD Author Type: Resident Filed: 06/11/2017 5:24 PM  Note Status: Written Cosign: Cosign Not Required Encounter Date: 06/11/2017  Problem: Type 2 diabetes mellitus with diabetic neuropathy [facility])  Editor: Lorella Nimrod, MD (Resident)  She has uncontrolled diabetes with last A1c done in March was 10.9. She did bring her glucometer which shows average glucose reading of 146, remained within target 29%, no hypoglycemic event.  Appears much improved as compared to her previous reading.  Patient was placed on Lantus 20 units at bedtime and Januvia was added to her previous regimen of metformin 1000 mg twice daily and Invokana. According to patient she cannot afford Lantus and Januvia as each caused her dollar 100 each month.  Her CBG in the clinic was 292 but she just ate a corn bread along with a bowl of pasta before coming to the clinic.  With the help of Dr. Maudie Mercury I was able to find  a coupon for Janumet XR 50-1000  for $5. Coupon for Lantus was also provided-she should be able to get her Lantus for free. She gets her Invokana for 3. Patient agreed to continue all those medications with this cost.  Please see assessment and plan of thigh pain for her neuropathy.       Pt has since been to ortho, PT and was referred to neurology, but do not see visit notes in chart.      Patient Active Problem List   Diagnosis Date Noted  . Swelling of right foot 07/08/2017  . Dysuria 07/08/2017  . Left thigh pain 06/11/2017  . Weight loss 04/08/2017  . Proximal limb muscle weakness 04/01/2017  . Type 2 diabetes mellitus with diabetic neuropathy ([place]) 04/01/2017  . Hypertension 04/01/2017  . Chronic diarrhea 04/01/2017     Prior to Admission medications   Medication Sig Start Date End Date Taking? Authorizing Provider  canagliflozin (INVOKANA) 300 MG TABS tablet Take 300 mg by mouth daily. 12/11/14  Yes [provider]  diclofenac sodium (VOLTAREN) 1 % GEL APPLY 4 GRAMS TOPICALLY TO AFFECTED AREA 4 TIMES DAILY 07/08/17  Yes [provider]  gabapentin (NEURONTIN) 600 MG tablet Take 2 tablets (1,200 mg total) by mouth 2 (two) times daily. 07/08/17 10/06/17 Yes Zada Finders, MD  insulin glargine (LANTUS) 100 unit/mL SOPN Inject 0.2 mLs (20 Units total) into the skin at bedtime. 06/11/17  Yes Lorella Nimrod, MD  Insulin Pen Needle 32G X 4 MM MISC 10 Units by Does not apply route daily. 04/01/17  Yes Lorella Nimrod, MD  lisinopril (PRINIVIL,ZESTRIL) 10 MG tablet Take 1 tablet (10 mg total) by mouth daily. 04/01/17  Yes Lorella Nimrod, MD  lovastatin (MEVACOR) 20 MG tablet Take 1 tablet (20 mg total) by mouth daily. 05/18/17  Yes Lorella Nimrod, MD  polyethylene glycol (MIRALAX / GLYCOLAX) packet Take 17 g by mouth daily.   Yes [provider]  SitaGLIPtin-MetFORMIN HCl (JANUMET XR) 50-1000 MG TB24 Take 1 tablet by mouth 2 (two) times daily. 06/11/17  Yes Lorella Nimrod, MD  vitamin C (ASCORBIC ACID) 500 MG tablet Take 500 mg by mouth daily.   Yes [provider]     Allergies  Allergen Reactions  . Duloxetine Rash  . Dulaglutide Nausea Only and Nausea And Vomiting  . Hydrocodone     Edema   . Lyrica [Pregabalin]     Edema     Family History  Problem Relation Age of Onset  . Heart disease Unknown   . Arthritis Unknown   . Lung disease Unknown   . Cancer Unknown   . Kidney disease Unknown   . Diabetes Unknown      Social History   Socioeconomic History  . Marital status: Single  Spouse name: Not on file  . Number of children: Not on file  . Years of education: Not on file  . Highest education level: Not on file  Occupational History  . Not on file  Social Needs  . Financial resource strain: Not on file  . Food insecurity:    Worry: Not on file    Inability: Not on file  . Transportation needs:    Medical: Not on file    Non-medical: Not on file  Tobacco Use  . Smoking status: Never Smoker  . Smokeless tobacco: Current User    Types: Chew  . Tobacco comment: Chews 1 pack per day  Substance and Sexual Activity  . Alcohol use: No  . Drug use: No  . Sexual activity: Not Currently  Lifestyle  . Physical activity:    Days per week: Not on file    Minutes per session: Not on file  . Stress: Not on file  Relationships  . Social connections:    Talks on phone: Not on file    Gets together: Not on file    Attends religious service: Not on file    Active member of club or organization: Not on file    Attends meetings of clubs or organizations: Not on file    Relationship status: Not on file  . Intimate partner violence:    Fear of current or ex partner: Not on file    Emotionally abused: Not on file    Physically abused: Not on file    Forced sexual activity: Not on file  Other Topics Concern  . Not on file  Social History Narrative  . Not on file     Review of Systems  Constitutional: Negative.   Negative for activity change, appetite change, chills, diaphoresis, fatigue, fever and unexpected weight change.  HENT: Negative.   Eyes: Negative.   Respiratory: Negative.   Cardiovascular: Positive for chest pain. Negative for palpitations and leg swelling.  Gastrointestinal: Positive for abdominal pain. Negative for abdominal distention, anal bleeding, blood in stool, constipation, diarrhea, nausea, rectal pain and vomiting.  Endocrine: Negative.   Genitourinary: Positive for decreased urine volume. Negative for difficulty urinating, dyspareunia, dysuria, enuresis, flank pain, frequency, genital sores, hematuria, menstrual problem, pelvic pain, urgency, vaginal discharge and vaginal pain.  Musculoskeletal: Negative.   Skin: Negative.  Negative for color change.  Allergic/Immunologic: Negative.   Hematological: Negative.   Psychiatric/Behavioral: Negative.   All other systems reviewed and are negative.      Objective:    Vitals:   07/29/17 0900  BP: 138/64  Pulse: 90  Resp: 16  Temp: 98.4 F (36.9 C)  TempSrc: Oral  SpO2: 100%  Weight: 176 lb (79.8 kg)  Height: 5' 3.78" (1.62 m)      Physical Exam  Constitutional: She is oriented to person, place, and time. She appears well-developed and well-nourished.  Non-toxic appearance. No distress.  Obese, chronically ill appearing AAF, appears older than stated age, no acute distress  HENT:  Head: Normocephalic and atraumatic.  Right Ear: External ear normal.  Left Ear: External ear normal.  Nose: Nose normal.  Mouth/Throat: Uvula is midline, oropharynx is clear and moist and mucous membranes are normal.  Eyes: Pupils are equal, round, and reactive to light. Conjunctivae, EOM and lids are normal. No scleral icterus.  Neck: Normal range of motion and phonation normal. Neck supple. No tracheal deviation present.  Cardiovascular: Regular rhythm and normal heart sounds. Tachycardia present. Exam reveals no gallop  and no friction  rub.  No murmur heard. Pulses:      Radial pulses are 2+ on the right side, and 2+ on the left side.  Pulmonary/Chest: Effort normal and breath sounds normal. No stridor. No respiratory distress. She has no decreased breath sounds. She has no wheezes. She has no rhonchi. She has no rales. Chest wall is not dull to percussion. She exhibits no tenderness, no bony tenderness, no crepitus, no edema, no swelling and no retraction.  Abdominal: Soft. Normal appearance and bowel sounds are normal. She exhibits no distension, no abdominal bruit, no pulsatile midline mass and no mass. There is no hepatosplenomegaly. There is no tenderness. There is no rigidity, no rebound, no guarding, no CVA tenderness, no tenderness at McBurney's point and negative Murphy's sign. No hernia.  Soft protuberant abdomen, transverse abdominal surgical scar to right abdomen,   Musculoskeletal: Normal range of motion. She exhibits no edema or deformity.  Lymphadenopathy:    She has no cervical adenopathy.  Neurological: She is alert and oriented to person, place, and time. She exhibits normal muscle tone. Gait normal.  Skin: Skin is warm, dry and intact. Capillary refill takes less than 2 seconds. No rash noted. She is not diaphoretic. No pallor.  Psychiatric: She has a normal mood and affect. Her speech is normal and behavior is normal.  Nursing note and vitals reviewed.    EKG:  Sinus rhythm, occasional ectopic beat, RAE Pending S.P. Review/consultation of EKG  Labs per chart review and care everywhere: HbA1C  10.9 (H)  04/10/17 HGBA1C  >14.0 (H)  11/06/2016 HGBA1C  12.6 (H)  07/06/2016  HGBA1C  11.7 (H)  04/06/2016  HGBA1C  10.7 (H)  12/16/2015     Assessment & Plan:      ICD-10-CM   1. Abdominal pain, unspecified abdominal location O97.3 COMPLETE METABOLIC PANEL WITH GFR    Lipase    CBC with Differential    DG Abd 1 View    Urinalysis, Routine w reflex microscopic  2. Chest pain, unspecified type R07.9 EKG  12-Lead    DG Chest 2 View  3. Type 2 diabetes mellitus with diabetic neuropathy, with long-term current use of insulin (HCC) E11.40 Hemoglobin A1c   Z79.4   4. Encounter to establish care with new doctor Z76.89   5. General medical exam Z00.00 Hemoglobin A1c    COMPLETE METABOLIC PANEL WITH GFR    Lipase    CBC with Differential    Lipid Panel    67 year old female with complaints of 9 months of continued and severe upper abdominal and lower chest pain tends to be more central and right-sided but does radiate across to left side, up and down bilateral sides of her body and up and down central abdomen, epigastric area and central chest area.  She states that it did begin in October when she began taking Trulicity injections however per chart review she did again this medication at least 2 months before that.  She had a recent colonoscopy that was negative for colitis, precancerous lesions and was positive for mild edema, per biopsy of left colon.  She does not have any reflux symptoms per her history, she is status post cholecystectomy.  She has been taking possibly toxic doses of Tylenol but 2 weeks ago states that she has reduce that to about 3000 mg per 24 hours.  Very skeptical about the accuracy of her history because of difficulty with answering questions, frequent changes and PCP in the last  2 to 3 months.  She did continue to ask for pain medication or alternatives to Tylenol.  I advised her that I needed to wait until basic lab work was done with concerns for possible liver damage, would like to check her kidney function and A1c.  Pt instructed to currently avoid taking any Tylenol.  EKG done which shows normal sinus rhythm, right atrial enlargement and possible pulmonary disease.  She had no respiratory symptoms.  All pain in lower lateral ribs she denied any inspiratory component.  Pain was mostly worse with eating and no other alleviating or aggravating factors.  Order chest x-ray and KUB, see  met, CBC, lipase, lipid panel and urinalysis.  Will start trial of PPI, 20 mg Protonix.    On exam she had no focal abdominal pain or tenderness to palpation, no rebound, guarding or rigidity.  She is nondistended with normal bowel sounds, had no CVA tenderness.  Will consult SP/attending with pt complicated hx.    Delsa Grana, PA-C 07/29/17 1:45 PM

## 2017-07-29 NOTE — Progress Notes (Deleted)
   Subjective:    Patient ID: Destiny Hurley, female    DOB: 02/17/1950, 67 y.o.   MRN: [id number]  Abdominal Pain  This is a chronic problem. The current episode started more than 1 month ago (October 2018). The onset quality is gradual. The problem occurs constantly. The most recent episode lasted 9 months. The pain is located in the LUQ, RUQ and epigastric region. The pain is at a severity of 10/10. The pain is severe. The quality of the pain is sharp and cramping. The abdominal pain radiates to the epigastric region, chest, periumbilical region and RUQ (pain radiates everywhere - to bilateral sides, to epigastric area and central chest and up and down central abdomen, no radiation to back). Associated symptoms include weight loss. Pertinent negatives include no anorexia, belching, constipation, diarrhea, dysuria, fever, flatus, headaches, hematochezia, hematuria, melena, nausea or vomiting. The pain is aggravated by eating. The pain is relieved by nothing. She has tried acetaminophen and antacids for the symptoms. Prior diagnostic workup includes lower endoscopy. Her past medical history is significant for abdominal surgery, gallstones, GERD, irritable bowel syndrome and PUD. There is no history of colon cancer, Crohn's disease, pancreatitis or ulcerative colitis.   Pt is also new to establish care here, recently got new medicare insurance 07/19/17.  [facility], Dr. Graciela HusbandsKlein, and [facility] seeing Dr. Nelson ChimesAmin.     Review of Systems  Constitutional: Positive for weight loss. Negative for fever.  Gastrointestinal: Positive for abdominal pain. Negative for anorexia, constipation, diarrhea, flatus, hematochezia, melena, nausea and vomiting.  Genitourinary: Negative for dysuria and hematuria.  Neurological: Negative for headaches.       Objective:   Physical Exam        Assessment & Plan:

## 2017-07-29 NOTE — Progress Notes (Deleted)
Patient ID: Destiny Hurley, female    DOB: 03/28/1950, 67 y.o.   MRN: [id number]  PCP: Danelle Berryapia, Anabell Swint, PA-C  Chief Complaint  Patient presents with  . New Patient CPE    is fasting  . B Side Pain    states that she has constant pain in B sides that radiates to upper ABD- states that pain is so severe that she can't rest    Subjective:   Destiny Hurley is a 67 y.o. female, presents to clinic with CC of   Patient Active Problem List   Diagnosis Date Noted  . Swelling of right foot 07/08/2017  . Dysuria 07/08/2017  . Left thigh pain 06/11/2017  . Weight loss 04/08/2017  . Proximal limb muscle weakness 04/01/2017  . Type 2 diabetes mellitus with diabetic neuropathy (HCC) 04/01/2017  . Hypertension 04/01/2017  . Chronic diarrhea 04/01/2017     Prior to Admission medications   Medication Sig Start Date End Date Taking? Authorizing Provider  canagliflozin (INVOKANA) 300 MG TABS tablet Take 300 mg by mouth daily. 12/11/14  Yes [provider]  diclofenac sodium (VOLTAREN) 1 % GEL APPLY 4 GRAMS TOPICALLY TO AFFECTED AREA 4 TIMES DAILY 07/08/17  Yes [provider]  gabapentin (NEURONTIN) 600 MG tablet Take 2 tablets (1,200 mg total) by mouth 2 (two) times daily. 07/08/17 10/06/17 Yes Darreld McleanPatel, Vishal, MD  insulin glargine (LANTUS) 100 unit/mL SOPN Inject 0.2 mLs (20 Units total) into the skin at bedtime. 06/11/17  Yes Arnetha CourserAmin, Sumayya, MD  Insulin Pen Needle 32G X 4 MM MISC 10 Units by Does not apply route daily. 04/01/17  Yes Arnetha CourserAmin, Sumayya, MD  lisinopril (PRINIVIL,ZESTRIL) 10 MG tablet Take 1 tablet (10 mg total) by mouth daily. 04/01/17  Yes Arnetha CourserAmin, Sumayya, MD  lovastatin (MEVACOR) 20 MG tablet Take 1 tablet (20 mg total) by mouth daily. 05/18/17  Yes Arnetha CourserAmin, Sumayya, MD  polyethylene glycol (MIRALAX / GLYCOLAX) packet Take 17 g by mouth daily.   Yes [provider]  SitaGLIPtin-MetFORMIN HCl (JANUMET XR) 50-1000 MG TB24 Take 1 tablet by mouth 2 (two) times  daily. 06/11/17  Yes Arnetha CourserAmin, Sumayya, MD  vitamin C (ASCORBIC ACID) 500 MG tablet Take 500 mg by mouth daily.   Yes [provider]     Allergies  Allergen Reactions  . Duloxetine Rash  . Dulaglutide Nausea Only and Nausea And Vomiting  . Hydrocodone     Edema   . Lyrica [Pregabalin]     Edema     Family History  Problem Relation Age of Onset  . Heart disease Unknown   . Arthritis Unknown   . Lung disease Unknown   . Cancer Unknown   . Kidney disease Unknown   . Diabetes Unknown      Social History   Socioeconomic History  . Marital status: Single    Spouse name: Not on file  . Number of children: Not on file  . Years of education: Not on file  . Highest education level: Not on file  Occupational History  . Not on file  Social Needs  . Financial resource strain: Not on file  . Food insecurity:    Worry: Not on file    Inability: Not on file  . Transportation needs:    Medical: Not on file    Non-medical: Not on file  Tobacco Use  . Smoking status: Never Smoker  . Smokeless tobacco: Current User    Types: Chew  . Tobacco comment:  Chews 1 pack per day  Substance and Sexual Activity  . Alcohol use: No  . Drug use: No  . Sexual activity: Not Currently  Lifestyle  . Physical activity:    Days per week: Not on file    Minutes per session: Not on file  . Stress: Not on file  Relationships  . Social connections:    Talks on phone: Not on file    Gets together: Not on file    Attends religious service: Not on file    Active member of club or organization: Not on file    Attends meetings of clubs or organizations: Not on file    Relationship status: Not on file  . Intimate partner violence:    Fear of current or ex partner: Not on file    Emotionally abused: Not on file    Physically abused: Not on file    Forced sexual activity: Not on file  Other Topics Concern  . Not on file  Social History Narrative  . Not on file     Review of  Systems     Objective:    Vitals:   07/29/17 0900  BP: 138/64  Pulse: 90  Resp: 16  Temp: 98.4 F (36.9 C)  TempSrc: Oral  SpO2: 100%  Weight: 176 lb (79.8 kg)  Height: 5' 3.78" (1.62 m)      Physical Exam        Assessment & Plan:    No diagnosis found.     Danelle Berry, PA-C 07/29/17 9:27 AM

## 2017-07-30 ENCOUNTER — Other Ambulatory Visit: Payer: Self-pay | Admitting: Family Medicine

## 2017-07-30 ENCOUNTER — Encounter (HOSPITAL_COMMUNITY): Payer: Self-pay | Admitting: Emergency Medicine

## 2017-07-30 ENCOUNTER — Emergency Department (HOSPITAL_COMMUNITY)
Admission: EM | Admit: 2017-07-30 | Discharge: 2017-07-31 | Disposition: A | Discharge status: Home / Self Care | Payer: Commercial Managed Care - HMO | Attending: Emergency Medicine | Admitting: Emergency Medicine

## 2017-07-30 ENCOUNTER — Ambulatory Visit
Admission: RE | Admit: 2017-07-30 | Discharge: 2017-07-30 | Disposition: A | Discharge status: Home / Self Care | Payer: BLUE CROSS/BLUE SHIELD | Source: Ambulatory Visit | Attending: Family Medicine | Admitting: Family Medicine

## 2017-07-30 ENCOUNTER — Other Ambulatory Visit: Payer: Self-pay

## 2017-07-30 ENCOUNTER — Encounter: Payer: Self-pay | Admitting: Family Medicine

## 2017-07-30 DIAGNOSIS — R1110 Vomiting, unspecified: Secondary | ICD-10-CM | POA: Diagnosis not present

## 2017-07-30 DIAGNOSIS — R197 Diarrhea, unspecified: Secondary | ICD-10-CM | POA: Diagnosis not present

## 2017-07-30 DIAGNOSIS — K859 Acute pancreatitis without necrosis or infection, unspecified: Secondary | ICD-10-CM

## 2017-07-30 DIAGNOSIS — E119 Type 2 diabetes mellitus without complications: Secondary | ICD-10-CM | POA: Insufficient documentation

## 2017-07-30 DIAGNOSIS — I10 Essential (primary) hypertension: Secondary | ICD-10-CM | POA: Diagnosis not present

## 2017-07-30 DIAGNOSIS — Z79899 Other long term (current) drug therapy: Secondary | ICD-10-CM | POA: Insufficient documentation

## 2017-07-30 DIAGNOSIS — R1013 Epigastric pain: Secondary | ICD-10-CM | POA: Diagnosis not present

## 2017-07-30 DIAGNOSIS — R109 Unspecified abdominal pain: Secondary | ICD-10-CM | POA: Diagnosis not present

## 2017-07-30 DIAGNOSIS — Z794 Long term (current) use of insulin: Secondary | ICD-10-CM | POA: Diagnosis not present

## 2017-07-30 HISTORY — DX: Acute pancreatitis without necrosis or infection, unspecified: K85.90

## 2017-07-30 LAB — CBC
HCT: 45.9 % (ref 36.0–46.0)
Hemoglobin: 14.8 g/dL (ref 12.0–15.0)
MCH: 28.4 pg (ref 26.0–34.0)
MCHC: 32.2 g/dL (ref 30.0–36.0)
MCV: 87.9 fL (ref 78.0–100.0)
PLATELETS: 370 10*3/uL (ref 150–400)
RBC: 5.22 MIL/uL — ABNORMAL HIGH (ref 3.87–5.11)
RDW: 12.7 % (ref 11.5–15.5)
WBC: 10.6 10*3/uL — AB (ref 4.0–10.5)

## 2017-07-30 LAB — COMPREHENSIVE METABOLIC PANEL
ALT: 15 U/L (ref 0–44)
AST: 16 U/L (ref 15–41)
Albumin: 4.1 g/dL (ref 3.5–5.0)
Alkaline Phosphatase: 105 U/L (ref 38–126)
Anion gap: 12 (ref 5–15)
BILIRUBIN TOTAL: 0.5 mg/dL (ref 0.3–1.2)
BUN: 31 mg/dL — AB (ref 8–23)
CO2: 23 mmol/L (ref 22–32)
CREATININE: 1.12 mg/dL — AB (ref 0.44–1.00)
Calcium: 9.6 mg/dL (ref 8.9–10.3)
Chloride: 104 mmol/L (ref 98–111)
GFR calc Af Amer: 58 mL/min — ABNORMAL LOW (ref >60)
GFR, EST NON AFRICAN AMERICAN: 50 mL/min — AB (ref >60)
GLUCOSE: 235 mg/dL — AB (ref 70–99)
Potassium: 4.6 mmol/L (ref 3.5–5.1)
Sodium: 139 mmol/L (ref 135–145)
TOTAL PROTEIN: 7.2 g/dL (ref 6.5–8.1)

## 2017-07-30 LAB — URINALYSIS, ROUTINE W REFLEX MICROSCOPIC
Bacteria, UA: NONE SEEN
Bilirubin Urine: NEGATIVE
Hgb urine dipstick: NEGATIVE
Ketones, ur: NEGATIVE mg/dL
LEUKOCYTES UA: NEGATIVE
NITRITE: NEGATIVE
PH: 5 (ref 5.0–8.0)
Protein, ur: NEGATIVE mg/dL
SPECIFIC GRAVITY, URINE: 1.024 (ref 1.005–1.030)

## 2017-07-30 LAB — CBC WITH DIFFERENTIAL/PLATELET
BASOS PCT: 0.9 %
Basophils Absolute: 77 cells/uL (ref 0–200)
Eosinophils Absolute: 138 cells/uL (ref 15–500)
Eosinophils Relative: 1.6 %
HCT: 48.7 % — ABNORMAL HIGH (ref 35.0–45.0)
Hemoglobin: 16.5 g/dL — ABNORMAL HIGH (ref 11.7–15.5)
Lymphs Abs: 3612 cells/uL (ref 850–3900)
MCH: 28.4 pg (ref 27.0–33.0)
MCHC: 33.9 g/dL (ref 32.0–36.0)
MCV: 84 fL (ref 80.0–100.0)
MPV: 11.3 fL (ref 7.5–12.5)
Monocytes Relative: 6.6 %
NEUTROS PCT: 48.9 %
Neutro Abs: 4205 cells/uL (ref 1500–7800)
PLATELETS: 394 10*3/uL (ref 140–400)
RBC: 5.8 10*6/uL — ABNORMAL HIGH (ref 3.80–5.10)
RDW: 13.1 % (ref 11.0–15.0)
TOTAL LYMPHOCYTE: 42 %
WBC: 8.6 10*3/uL (ref 3.8–10.8)
WBCMIX: 568 {cells}/uL (ref 200–950)

## 2017-07-30 LAB — HEMOGLOBIN A1C
EAG (MMOL/L): 10.6 (calc)
Hgb A1c MFr Bld: 8.3 % of total Hgb — ABNORMAL HIGH (ref <5.7)
Mean Plasma Glucose: 192 (calc)

## 2017-07-30 LAB — COMPLETE METABOLIC PANEL WITH GFR
AG RATIO: 1.7 (calc) (ref 1.0–2.5)
ALT: 11 U/L (ref 6–29)
AST: 13 U/L (ref 10–35)
Albumin: 4.9 g/dL (ref 3.6–5.1)
Alkaline phosphatase (APISO): 115 U/L (ref 33–130)
BILIRUBIN TOTAL: 0.4 mg/dL (ref 0.2–1.2)
BUN/Creatinine Ratio: 33 (calc) — ABNORMAL HIGH (ref 6–22)
BUN: 29 mg/dL — ABNORMAL HIGH (ref 7–25)
CHLORIDE: 102 mmol/L (ref 98–110)
CO2: 25 mmol/L (ref 20–32)
Calcium: 10.7 mg/dL — ABNORMAL HIGH (ref 8.6–10.4)
Creat: 0.89 mg/dL (ref 0.50–0.99)
GFR, EST NON AFRICAN AMERICAN: 68 mL/min/{1.73_m2} (ref >=60)
GFR, Est African American: 79 mL/min/{1.73_m2} (ref >=60)
GLOBULIN: 2.9 g/dL (ref 1.9–3.7)
Glucose, Bld: 233 mg/dL — ABNORMAL HIGH (ref 65–99)
POTASSIUM: 5 mmol/L (ref 3.5–5.3)
SODIUM: 137 mmol/L (ref 135–146)
Total Protein: 7.8 g/dL (ref 6.1–8.1)

## 2017-07-30 LAB — LIPID PANEL
CHOL/HDL RATIO: 2.5 (calc) (ref <5.0)
Cholesterol: 185 mg/dL (ref <200)
HDL: 74 mg/dL (ref >50)
LDL CHOLESTEROL (CALC): 78 mg/dL
Non-HDL Cholesterol (Calc): 111 mg/dL (calc) (ref <130)
Triglycerides: 249 mg/dL — ABNORMAL HIGH (ref <150)

## 2017-07-30 LAB — LIPASE, BLOOD: Lipase: 71 U/L — ABNORMAL HIGH (ref 11–51)

## 2017-07-30 LAB — LIPASE: Lipase: 196 U/L — ABNORMAL HIGH (ref 7–60)

## 2017-07-30 MED ORDER — ONDANSETRON HCL 4 MG/2ML IJ SOLN
4.0000 mg | Freq: Once | INTRAMUSCULAR | Status: AC
  Administered 2017-07-30: 4 mg via INTRAVENOUS
  Filled 2017-07-30: qty 2

## 2017-07-30 MED ORDER — MORPHINE SULFATE (PF) 4 MG/ML IV SOLN
4.0000 mg | Freq: Once | INTRAVENOUS | Status: AC
  Administered 2017-07-30: 4 mg via INTRAVENOUS
  Filled 2017-07-30: qty 1

## 2017-07-30 NOTE — ED Triage Notes (Signed)
Pt reports abdomen pain since last October, sent tonight by PCP for pancreatitis. Lipase yesterday 196. Denies N/V/D, just reporting pain at this time. Hx diabetes

## 2017-07-30 NOTE — ED Notes (Signed)
ED Provider at bedside. 

## 2017-07-30 NOTE — Progress Notes (Signed)
Critical labs notification received today at roughly 2pm, multiple attempts to reach pt by nursing staff and myself.  I have finally reached her tonight at 8pm.  Reviewed labs and advised pt to go to the ER, [facility], report called at 8:10 pm to liddy.  Labs concerning for pancreatitis, need to go to the ER for further work up and treatment.  Needs to stop taking sitagliptin immediately. Xrays of abdomen and chest were normal.   Her Hb A1C has improved since last labs - her diabetes medicines will need to be changed after ER eval/admission Pt has hemeconcentration, needs fluids

## 2017-07-30 NOTE — ED Provider Notes (Addendum)
[facility] Provider Note   CSN: [id number] Arrival date & time: 07/30/17  2135     History   Chief Complaint Chief Complaint  Patient presents with  . Abdominal Pain    HPI Destiny Hurley is a 67 y.o. female.  Patient presents to the emergency department with a chief complaint of abdominal pain.  She reports that she has had chronic epigastric abdominal pain for the past several months.  She states that she was seen at an outside facility and had an elevated lipase.  She states that the provider instructed her to come to the emergency department for further evaluation.  She reports that she has had some nausea and vomiting in the past, but has not had any today.  She reports persistent epigastric abdominal pain.  She denies any fevers chills.  Denies any alcohol use.  She reports a past surgical history remarkable for cholecystectomy.  The history is provided by the patient. No language interpreter was used.    Past Medical History:  Diagnosis Date  . Adrenal mass (HCC) 2012   [place] pen on CT  . Chronic painful diabetic neuropathy (HCC)   . Depression    situational  . Diabetes mellitus   . Dysplasia of tongue   . Esophageal mass   . Heart murmur    Echo 2/09 revealing aortic sclerosis without gradient  . Hyperlipemia   . Hypertension   . Left shoulder pain     Patient Active Problem List   Diagnosis Date Noted  . Pancreatitis 07/30/2017  . Swelling of right foot 07/08/2017  . Dysuria 07/08/2017  . Left thigh pain 06/11/2017  . Weight loss 04/08/2017  . Proximal limb muscle weakness 04/01/2017  . Type 2 diabetes mellitus with diabetic neuropathy (HCC) 04/01/2017  . Hypertension 04/01/2017  . Chronic diarrhea 04/01/2017    Past Surgical History:  Procedure Laterality Date  . CHOLECYSTECTOMY    . COLONOSCOPY    . COLONOSCOPY WITH PROPOFOL N/A 05/05/2017   Procedure: COLONOSCOPY WITH PROPOFOL;  Surgeon: Toledo,  Boykin Nearingeodoro K, MD;  Location: [facility] ENDOSCOPY;  Service: Gastroenterology;  Laterality: N/A;  . schatzki's ring     s/p dilation 2/04  . TUBAL LIGATION       OB History   None      Home Medications    Prior to Admission medications   Medication Sig Start Date End Date Taking? Authorizing Provider  canagliflozin (INVOKANA) 300 MG TABS tablet Take 300 mg by mouth daily. 12/11/14   [provider]  diclofenac sodium (VOLTAREN) 1 % GEL APPLY 4 GRAMS TOPICALLY TO AFFECTED AREA 4 TIMES DAILY 07/08/17   [provider]  gabapentin (NEURONTIN) 600 MG tablet Take 2 tablets (1,200 mg total) by mouth 2 (two) times daily. 07/08/17 10/06/17  Darreld McleanPatel, Vishal, MD  insulin glargine (LANTUS) 100 unit/mL SOPN Inject 0.2 mLs (20 Units total) into the skin at bedtime. 06/11/17   Arnetha CourserAmin, Sumayya, MD  Insulin Pen Needle 32G X 4 MM MISC 10 Units by Does not apply route daily. 04/01/17   Arnetha CourserAmin, Sumayya, MD  lisinopril (PRINIVIL,ZESTRIL) 10 MG tablet Take 1 tablet (10 mg total) by mouth daily. 04/01/17   Arnetha CourserAmin, Sumayya, MD  lovastatin (MEVACOR) 20 MG tablet Take 1 tablet (20 mg total) by mouth daily. 05/18/17   Arnetha CourserAmin, Sumayya, MD  pantoprazole (PROTONIX) 20 MG tablet Take 1 tablet (20 mg total) by mouth daily. 07/29/17   Danelle Berryapia, Leisa, PA-C  polyethylene glycol (MIRALAX /  GLYCOLAX) packet Take 17 g by mouth daily.    [provider]  vitamin C (ASCORBIC ACID) 500 MG tablet Take 500 mg by mouth daily.    [provider]    Family History Family History  Problem Relation Age of Onset  . Heart disease Unknown   . Arthritis Unknown   . Lung disease Unknown   . Cancer Unknown   . Kidney disease Unknown   . Diabetes Unknown     Social History Social History   Tobacco Use  . Smoking status: Never Smoker  . Smokeless tobacco: Current User    Types: Chew  . Tobacco comment: Chews 1 pack per day  Substance Use Topics  . Alcohol use: No  . Drug use: No     Allergies   Duloxetine;  Dulaglutide; Hydrocodone; and Lyrica [pregabalin]   Review of Systems Review of Systems  All other systems reviewed and are negative.    Physical Exam Updated Vital Signs BP 135/80 (BP Location: Right Arm)   Pulse (!) 105   Temp 98.4 F (36.9 C) (Oral)   Resp 18   SpO2 99%   Physical Exam  Constitutional: She is oriented to person, place, and time. She appears well-developed and well-nourished.  HENT:  Head: Normocephalic and atraumatic.  Eyes: Pupils are equal, round, and reactive to light. Conjunctivae and EOM are normal.  Neck: Normal range of motion. Neck supple.  Cardiovascular: Normal rate and regular rhythm. Exam reveals no gallop and no friction rub.  No murmur heard. Pulmonary/Chest: Effort normal and breath sounds normal. No respiratory distress. She has no wheezes. She has no rales. She exhibits no tenderness.  Abdominal: Soft. Bowel sounds are normal. She exhibits no distension and no mass. There is no tenderness. There is no rebound and no guarding.  No focal abdominal tenderness, no RLQ tenderness or pain at McBurney's point, no RUQ tenderness or Murphy's sign, no left-sided abdominal tenderness, no fluid wave, or signs of peritonitis   Musculoskeletal: Normal range of motion. She exhibits no edema or tenderness.  Neurological: She is alert and oriented to person, place, and time.  Skin: Skin is warm and dry.  Psychiatric: She has a normal mood and affect. Her behavior is normal. Judgment and thought content normal.  Nursing note and vitals reviewed.    ED Treatments / Results  Labs (all labs ordered are listed, but only abnormal results are displayed) Labs Reviewed  CBC - Abnormal; Notable for the following components:      Result Value   WBC 10.6 (*)    RBC 5.22 (*)    All other components within normal limits  URINALYSIS, ROUTINE W REFLEX MICROSCOPIC - Abnormal; Notable for the following components:   Color, Urine STRAW (*)    Glucose, UA >=500 (*)      All other components within normal limits  LIPASE, BLOOD  COMPREHENSIVE METABOLIC PANEL    EKG None  Radiology Dg Chest 2 View  Result Date: 07/30/2017 CLINICAL DATA:  Bilateral lower chest pain x 9 months with NKI, HTN, DM EXAM: CHEST - 2 VIEW COMPARISON:  None. FINDINGS: The heart size and mediastinal contours are within normal limits. Both lungs are clear. The visualized skeletal structures are unremarkable. IMPRESSION: No active cardiopulmonary disease. Electronically Signed   By: Bary Richard [name]   On: 07/30/2017 10:55   Dg Abd 1 View  Result Date: 07/30/2017 CLINICAL DATA:  Insert click sickle EXAM: ABDOMEN - 1 VIEW COMPARISON:  CT  abdomen dated 09/27/2010. FINDINGS: Visualized bowel gas pattern is nonobstructive. No evidence of soft tissue mass or abnormal fluid collection. No evidence of free intraperitoneal air. No evidence of renal or ureteral calculi. Calcified uterine fibroids and phleboliths in the pelvis. No acute or suspicious osseous finding IMPRESSION: No acute findings.  Nonobstructive bowel gas pattern. Electronically Signed   By: Bary Richard [name]   On: 07/30/2017 10:56    Procedures Procedures (including critical care time)  Medications Ordered in ED Medications  morphine 4 MG/ML injection 4 mg (has no administration in time range)  ondansetron (ZOFRAN) injection 4 mg (has no administration in time range)     Initial Impression / Assessment and Plan / ED Course  I have reviewed the triage vital signs and the nursing notes.  Pertinent labs & imaging results that were available during my care of the patient were reviewed by me and considered in my medical decision making (see chart for details).    Patient sent to the emergency department for chronic abdominal pain in the setting of an elevated lipase.  A recent lipase was noted to be 196.  Her lipase today is 71.  CT scan today shows no evidence of acute pancreatitis.  She does have a history of  cholecystectomy.  Her vital signs are stable.  She is in no acute distress.  She did have epigastric pain, and has had this pain since last October, will try omeprazole and Carafate.  Recommend PCP follow-up.  Final Clinical Impressions(s) / ED Diagnoses   Final diagnoses:  Epigastric pain    ED Discharge Orders        Ordered    omeprazole (PRILOSEC) 20 MG capsule  Daily     07/31/17 0102    sucralfate (CARAFATE) 1 g tablet  3 times daily with meals & bedtime     07/31/17 0102       Roxy Horseman, PA-C 07/31/17 0104    Blane Ohara, MD 08/03/17 0400    Roxy Horseman, PA-C 08/26/17 [id number]    Blane Ohara, MD 08/30/17 1940

## 2017-07-30 NOTE — ED Notes (Signed)
Patient transported to CT 

## 2017-07-31 ENCOUNTER — Other Ambulatory Visit (HOSPITAL_COMMUNITY): Payer: Self-pay

## 2017-07-31 ENCOUNTER — Emergency Department (HOSPITAL_COMMUNITY): Payer: Commercial Managed Care - HMO

## 2017-07-31 DIAGNOSIS — R197 Diarrhea, unspecified: Secondary | ICD-10-CM | POA: Diagnosis not present

## 2017-07-31 DIAGNOSIS — R1110 Vomiting, unspecified: Secondary | ICD-10-CM | POA: Diagnosis not present

## 2017-07-31 DIAGNOSIS — R109 Unspecified abdominal pain: Secondary | ICD-10-CM | POA: Diagnosis not present

## 2017-07-31 MED ORDER — OMEPRAZOLE 20 MG PO CPDR: 20.0000 mg | DELAYED_RELEASE_CAPSULE | Freq: Every day | ORAL | 0 refills | Status: DC

## 2017-07-31 MED ORDER — IOHEXOL 300 MG/ML  SOLN
100.0000 mL | Freq: Once | INTRAMUSCULAR | Status: AC | PRN
  Administered 2017-07-31: 100 mL via INTRAVENOUS

## 2017-07-31 MED ORDER — SUCRALFATE 1 G PO TABS: 1.0000 g | ORAL_TABLET | Freq: Three times a day (TID) | ORAL | 0 refills | Status: DC

## 2017-07-31 MED ORDER — PANTOPRAZOLE SODIUM 40 MG PO TBEC
40.0000 mg | DELAYED_RELEASE_TABLET | Freq: Every day | ORAL | Status: DC
  Administered 2017-07-31: 40 mg via ORAL
  Filled 2017-07-31: qty 1

## 2017-08-03 ENCOUNTER — Ambulatory Visit (INDEPENDENT_AMBULATORY_CARE_PROVIDER_SITE_OTHER): Payer: Medicare HMO | Admitting: Family Medicine

## 2017-08-03 ENCOUNTER — Encounter: Payer: Self-pay | Admitting: Family Medicine

## 2017-08-03 VITALS — BP 122/68 | HR 84 | Temp 98.1°F | Resp 14 | Ht 65.0 in | Wt 178.8 lb

## 2017-08-03 DIAGNOSIS — R748 Abnormal levels of other serum enzymes: Secondary | ICD-10-CM | POA: Diagnosis not present

## 2017-08-03 DIAGNOSIS — E1165 Type 2 diabetes mellitus with hyperglycemia: Secondary | ICD-10-CM

## 2017-08-03 DIAGNOSIS — Z09 Encounter for follow-up examination after completed treatment for conditions other than malignant neoplasm: Secondary | ICD-10-CM

## 2017-08-03 DIAGNOSIS — R109 Unspecified abdominal pain: Secondary | ICD-10-CM | POA: Diagnosis not present

## 2017-08-03 MED ORDER — TRAMADOL HCL 50 MG PO TABS: 50.0000 mg | ORAL_TABLET | Freq: Three times a day (TID) | ORAL | 0 refills | Status: AC | PRN

## 2017-08-03 MED ORDER — PANTOPRAZOLE SODIUM 20 MG PO TBEC: 20.0000 mg | DELAYED_RELEASE_TABLET | Freq: Every day | ORAL | 0 refills | Status: DC

## 2017-08-03 NOTE — Patient Instructions (Addendum)
No NSAIDs - no ibuprofen, aleve, naproxen, motrin  Tylenol for pain, but it needs to be [phone] mg only 2-3 times a day only. Do not take more than 3000 mg in 24 hours, or more than 6 extra strength pills in one whole day.     Peptic Ulcer A peptic ulcer is a painful sore in the lining of your esophagus, stomach, or the first part of your small intestine. You may have pain in the area between your chest and your belly button. The most common causes of an ulcer are:  An infection.  Using certain pain medicines too often or too much.  Follow these instructions at home:  Avoid alcohol.  Avoid caffeine.  Do not use any tobacco products. These include cigarettes, chewing tobacco, and e-cigarettes. If you need help quitting, ask your doctor.  Take over-the-counter and prescription medicines only as told by your doctor. Do not stop or change your medicines unless you talk with your doctor about it first.  Keep all follow-up visits as told by your doctor. This is important. Contact a doctor if:  You do not get better in 7 days after you start treatment.  You keep having an upset stomach (indigestion) or heartburn. Get help right away if:  You have sudden, sharp pain in your belly (abdomen).  You have lasting belly pain.  You have bloody poop (stool) or black, tarry poop.  You throw up (vomit) blood. It may look like coffee grounds.  You feel light-headed or feel like you may pass out (faint).  You get weak.  You get sweaty or feel sticky an  d cold to the touch (clammy). This information is not intended to replace advice given to you by your health care provider. Make sure you discuss any questions you have with your health care provider. Document Released: 04/01/2009 Document Revised: 05/22/2015 Document Reviewed: 10/06/2014 Elsevier Interactive Patient Education  [employer]2018 [employer]

## 2017-08-03 NOTE — Progress Notes (Signed)
Patient ID: Destiny Hurley, female    DOB: 1950/03/08, 67 y.o.   MRN: [id number]  PCP: Danelle Berry, PA-C  Chief Complaint  Patient presents with  . follow up with abnormal lab work    Subjective:   Destiny Hurley is a 67 y.o. female, presents to clinic with CC of severe abdominal pain since October 2018, critically elevated lipase suspicious for pancreatitis, was sent to the ER 07/30/17 due to labs for imaging of pancreas, and returns today for reevaluate.  ER records have been reviewed as well as CT imaging.  Patient was status post cholecystectomy, hepatobiliary system showed mild intra-and extrahepatic biliary dilatation secondary to surgery, pancreas was unremarkable without any dilatation or inflammation, mild supraumbilical hernia containing fat without any herniated bowel, no acute findings or explanation for patient's symptoms.  Did have stable left adrenal nodule consistent with adenoma and calcified uterine fibroids.  Interval resolution of hepatic steatosis and hepatomegaly.  Last week when I called the patient her Janumet was discontinued, possibly contributory towards elevated lipase.  Patient denies any ETOH.  On 07/29/17 and 07/30/17 LFT's were normal.  FLP was normal except for mildly elevated triglycerides at 249.  Pt had very mild hypercalcemia (10.7)  A1C was down to 8.3.  ER evaluation also showed improved lipase 71 (RR 11-51 U/L) from the day before, pt hyperglycemic, mild decreased kidney function from the day before.  ER diagnosed with suspicious duodenal ulcers and had change treatment management from Protonix that I prescribed the day prior, to omeprazole and Carafate.  Patient brings in with her omeprazole prescription and Carafate, states that pain right now is 5/10, located in epigastric area no change with carafate or omeprazole.  She is only had 2 days of taking these medicines.  States that in the ER IV morphine was the only thing that completely got rid of her  pain and after being discharged on Saturday and Sunday her pain continued to radiate up and down both of her side and across her upper abdomen.  Yesterday and this morning pain is stayed more localized to her epigastrium without any radiation.  In reviewing medications, I do not believe pt obtained or tried protonix.  She did stop Janumet per instructions on 07/30/17.  For DM is currently taking 20 units of Lantus at night and Invokana 300 mg once daily.  She has not checked her sugars today. She continues to deny any nausea, vomiting, diarrhea, hematochezia, melena, pain with eating, difficulty swallowing or pain with swallowing.   07/29/17 HPI: Destiny Hurley is a 67 y.o. female, presents to clinic with CC of severe abdominal pain, also is new pt here, but hx is very complex and confusing, possibly at least 2 recent PCP's in last 2 months.  Pt difficult historian as well.  Abdominal Pain  This is a chronic problem. The current episode started more than 1 month ago (October 2018). The onset quality is gradual, started after some change in medications for diabetes, but specifically she states when she started trulicity injection, and although she stopped taking the medicine her sx continued. The problem occurs constantly. The most recent episode lasted 9 months. The pain is located in the LUQ, RUQ and epigastric region. The pain is at a severity of 10/10. The pain is severe. The quality of the pain is sharp and cramping. The abdominal pain radiates to the epigastric region, chest, periumbilical region and RUQ (pain radiates everywhere - to bilateral sides, to epigastric area  and central chest and up and down central abdomen, no radiation to back). Associated symptoms include weight loss. Pertinent negatives include no anorexia, belching, constipation, diarrhea, dysuria, fever, flatus, headaches, hematochezia, hematuria, melena, nausea or vomiting. The pain is aggravated by eating. The pain is relieved  by nothing. She has tried acetaminophen and antacids for the symptoms. Prior diagnostic workup includes lower endoscopy. Her past medical history is significant for abdominal surgery, gallstones, GERD, irritable bowel syndrome and PUD. There is no history of colon cancer, Crohn's disease, pancreatitis or ulcerative colitis.  She did loose weight last year when working on diabetes management with group therapy and change in medications however this was expected and monitored, no recent or unintentional weight gain or loss. Patient also points to her bilateral ribs, sides and epigastric area stating that the pain is located all throughout lower chest as well as upper abdomen.  All pain symptoms are described the same.  She denies any inspiratory pain, shortness of breath, palpitations, chest pain or pressure, near-syncope, exertional shortness of breath, lower extremity edema, PND, orthopnea.   Pt notes using excessive amounts of tylenol for abdominal pain and asks for other pain management options.  She states she was taking 3 500 mg tylenol tablets every 2-3 hours, but 2 weeks ago she "backed off" and now is using less, for example yesterday took 1500 mg of tylenol in the morning and once at night.    Chart review shows only colonoscopy, screening for CRC, no upper endoscopy done.  Left colon cold biopsy done with 05/05/17 colonoscopy, pathology resulted with mild edema and negative for colitis and negative for dysplasia  Pt is also new to establish care here, recently got new medicare insurance 07/19/17.  Previously had care from [facility] clinic - internal medicine, Dr. Graciela Husbands, and [facility] internal medicine clinic seeing Dr. Nelson Chimes.    Patient Active Problem List   Diagnosis Date Noted  . Abdominal pain 08/04/2017  . Elevated lipase 08/04/2017  . Uncontrolled type 2 diabetes mellitus with hyperglycemia (HCC) 08/04/2017  . Pancreatitis 07/30/2017  . Swelling of right foot 07/08/2017  . Dysuria 07/08/2017    . Left thigh pain 06/11/2017  . Weight loss 04/08/2017  . Proximal limb muscle weakness 04/01/2017  . Type 2 diabetes mellitus with diabetic neuropathy (HCC) 04/01/2017  . Hypertension 04/01/2017  . Chronic diarrhea 04/01/2017     Prior to Admission medications   Medication Sig Start Date End Date Taking? Authorizing Provider  canagliflozin (INVOKANA) 300 MG TABS tablet Take 300 mg by mouth daily. 12/11/14  Yes [provider]  diclofenac sodium (VOLTAREN) 1 % GEL APPLY 4 GRAMS TOPICALLY TO AFFECTED AREA 4 TIMES DAILY 07/08/17  Yes [provider]  gabapentin (NEURONTIN) 600 MG tablet Take 2 tablets (1,200 mg total) by mouth 2 (two) times daily. 07/08/17 10/06/17 Yes Darreld Mclean, MD  insulin glargine (LANTUS) 100 unit/mL SOPN Inject 0.2 mLs (20 Units total) into the skin at bedtime. 06/11/17  Yes Arnetha Courser, MD  Insulin Pen Needle 32G X 4 MM MISC 10 Units by Does not apply route daily. 04/01/17  Yes Arnetha Courser, MD  lisinopril (PRINIVIL,ZESTRIL) 10 MG tablet Take 1 tablet (10 mg total) by mouth daily. 04/01/17  Yes Arnetha Courser, MD  lovastatin (MEVACOR) 20 MG tablet Take 1 tablet (20 mg total) by mouth daily. 05/18/17  Yes Arnetha Courser, MD  omeprazole (PRILOSEC) 20 MG capsule Take 1 capsule (20 mg total) by mouth daily. 07/31/17  Yes Roxy Horseman, PA-C  polyethylene glycol (MIRALAX / GLYCOLAX) packet Take 17 g by mouth daily.   Yes [provider]  sucralfate (CARAFATE) 1 g tablet Take 1 tablet (1 g total) by mouth 4 (four) times daily -  with meals and at bedtime. 07/31/17  Yes Roxy Horseman, PA-C  vitamin C (ASCORBIC ACID) 500 MG tablet Take 500 mg by mouth daily.   Yes [provider]     Allergies  Allergen Reactions  . Duloxetine Rash  . Dulaglutide Nausea Only and Nausea And Vomiting  . Hydrocodone     Edema   . Lyrica [Pregabalin]     Edema     Family History  Problem Relation Age of Onset  . Heart disease Unknown   .  Arthritis Unknown   . Lung disease Unknown   . Cancer Unknown   . Kidney disease Unknown   . Diabetes Unknown      Social History   Socioeconomic History  . Marital status: Single    Spouse name: Not on file  . Number of children: Not on file  . Years of education: Not on file  . Highest education level: Not on file  Occupational History  . Not on file  Social Needs  . Financial resource strain: Not on file  . Food insecurity:    Worry: Not on file    Inability: Not on file  . Transportation needs:    Medical: Not on file    Non-medical: Not on file  Tobacco Use  . Smoking status: Never Smoker  . Smokeless tobacco: Current User    Types: Chew  . Tobacco comment: Chews 1 pack per day  Substance and Sexual Activity  . Alcohol use: No  . Drug use: No  . Sexual activity: Not Currently  Lifestyle  . Physical activity:    Days per week: Not on file    Minutes per session: Not on file  . Stress: Not on file  Relationships  . Social connections:    Talks on phone: Not on file    Gets together: Not on file    Attends religious service: Not on file    Active member of club or organization: Not on file    Attends meetings of clubs or organizations: Not on file    Relationship status: Not on file  . Intimate partner violence:    Fear of current or ex partner: Not on file    Emotionally abused: Not on file    Physically abused: Not on file    Forced sexual activity: Not on file  Other Topics Concern  . Not on file  Social History Narrative  . Not on file     Review of Systems  Constitutional: Negative.  Negative for activity change, appetite change, chills, diaphoresis, fatigue, fever and unexpected weight change.  HENT: Negative.   Eyes: Negative.   Respiratory: Negative.  Negative for chest tightness and shortness of breath.   Cardiovascular: Negative.  Negative for chest pain, palpitations and leg swelling.  Gastrointestinal: Positive for abdominal pain.  Negative for abdominal distention, anal bleeding, blood in stool, constipation, diarrhea, nausea, rectal pain and vomiting.  Endocrine: Negative.  Negative for polydipsia, polyphagia and polyuria.  Genitourinary: Negative for difficulty urinating and dyspareunia.  Musculoskeletal: Negative.   Skin: Negative.  Negative for color change and rash.  Allergic/Immunologic: Negative.   Neurological: Negative.  Negative for dizziness, tremors, syncope, weakness, light-headedness, numbness and headaches.  Hematological: Negative.   Psychiatric/Behavioral: Negative.  All other systems reviewed and are negative.      Objective:    Vitals:   08/03/17 0907  BP: 122/68  Pulse: 84  Resp: 14  Temp: 98.1 F (36.7 C)  TempSrc: Oral  SpO2: 99%  Weight: 178 lb 12.8 oz (81.1 kg)  Height: 5\' 5"  (1.651 m)      Physical Exam  Constitutional: She is oriented to person, place, and time. She appears well-developed and well-nourished.  Non-toxic appearance. No distress.  Obese, chronically ill appearing AAF, appears older than stated age, no acute distress  HENT:  Head: Normocephalic and atraumatic.  Right Ear: External ear normal.  Left Ear: External ear normal.  Nose: Nose normal.  Mouth/Throat: Uvula is midline and mucous membranes are normal.  MMM  Eyes: Pupils are equal, round, and reactive to light. Conjunctivae, EOM and lids are normal. No scleral icterus.  Neck: Normal range of motion and phonation normal. Neck supple. No tracheal deviation present.  Cardiovascular: Normal rate, regular rhythm, normal heart sounds and normal pulses. Exam reveals no gallop and no friction rub.  No murmur heard. Pulses:      Radial pulses are 2+ on the right side, and 2+ on the left side.       Posterior tibial pulses are 2+ on the right side, and 2+ on the left side.  Pulmonary/Chest: Effort normal and breath sounds normal. No stridor. No respiratory distress. She has no decreased breath sounds. She has no  wheezes. She has no rhonchi. She has no rales. Chest wall is not dull to percussion. She exhibits no tenderness, no bony tenderness, no crepitus, no edema, no swelling and no retraction.  Abdominal: Soft. Normal appearance and bowel sounds are normal. She exhibits no distension, no abdominal bruit, no pulsatile midline mass and no mass. There is no hepatosplenomegaly. There is no tenderness. There is no rigidity, no rebound, no guarding, no CVA tenderness, no tenderness at McBurney's point and negative Murphy's sign. No hernia.  Soft protuberant abdomen, transverse abdominal surgical scar to right abdomen, no CVA tenderness  Musculoskeletal: Normal range of motion. She exhibits no edema or deformity.  Lymphadenopathy:    She has no cervical adenopathy.  Neurological: She is alert and oriented to person, place, and time. She exhibits normal muscle tone. Gait normal.  Skin: Skin is warm, dry and intact. Capillary refill takes less than 2 seconds. No rash noted. She is not diaphoretic. No pallor.  Psychiatric: She has a normal mood and affect. Her speech is normal and behavior is normal.  Nursing note and vitals reviewed.         Assessment & Plan:      ICD-10-CM   1. Abdominal pain, unspecified abdominal location R10.9 Ambulatory referral to Gastroenterology    pantoprazole (PROTONIX) 20 MG tablet    traMADol (ULTRAM) 50 MG tablet   con't abd pain, CT negative, tx with protonix, con't carafate, ddx: GERD, gastritis, esophagitis, PUD.  GI referral.  no NSAIDs, tramadol for pain  2. Elevated lipase R74.8 Ambulatory referral to Gastroenterology   no longer critically elevated per ER labs 07/30/17, stop Janumet, recheck labs in 4 weeks, GI referral  3. Uncontrolled type 2 diabetes mellitus with hyperglycemia (HCC) E11.65 Ambulatory referral to Endocrinology   d/c janumet, con't lantus and invokana.  Titrate lantus dose per FBS, sugar log, recheck in 2 weeks, written instructions and chart given  for BS and med adjust  4. Encounter for examination following treatment at hospital [phone]  Patient returns for follow-up after ER visit for abdominal pain and critically elevated lipase, upon arrival in ER lipase was minimally elevated.  No change to abdominal pain.  Will continue tx as described above.  GI referral for lipase/chronic abd pain eval.  We discussed foods to avoid with any possible gastritis or peptic ulcers.  She was instructed to discontinue NSAIDs.  She had started to chew tobacco again for her nerves because of the chronic abdominal pain and she was encouraged to reduce that as well because it well worsen her abdominal symptoms.  Denies any alcohol use at all, this was updated and reflected in chart history.  Gave tramadol for pain since she needs to avoid NSAIDs, she previously endorsed taking excessive amounts of Tylenol in the past.  I did review appropriate Tylenol dosing and printed out for her and highlighted that she is not to exceed 3000 mg in 24 hours.  Thankfully her labs from last week showed no LFT abnormalities.   Had to d/c janumet for possible etiology of elevated lipase.  Printed instructions given to the patient with specific dosing for Lantus titration.  With morning fasting blood sugars elevated over 180 patient will add 2 units at night.  If blood sugar is 80-120 she will keep the same dose at night.  Any blood sugar lower than 80 instructed to call us and discuss but may need to decrease dose slightly if symptomatic.  Reviewing chart regarding her control of diabetes she has had many medication changes since last year and she did see diabetes specialist in the [place] system but she refuses to go back to them.  Did refer to endocrinology here.  Her A1c has significantly improved over the past year it was greater than 14 and now is 8.3.  F/up in 2 weeks to recheck diabetes with med changes.  Danelle Berry, PA-C 08/04/17 12:39 PM

## 2017-08-04 ENCOUNTER — Encounter: Payer: Self-pay | Admitting: Family Medicine

## 2017-08-04 DIAGNOSIS — R109 Unspecified abdominal pain: Secondary | ICD-10-CM | POA: Insufficient documentation

## 2017-08-04 DIAGNOSIS — R748 Abnormal levels of other serum enzymes: Secondary | ICD-10-CM | POA: Insufficient documentation

## 2017-08-04 DIAGNOSIS — K222 Esophageal obstruction: Secondary | ICD-10-CM | POA: Insufficient documentation

## 2017-08-04 DIAGNOSIS — R011 Cardiac murmur, unspecified: Secondary | ICD-10-CM | POA: Insufficient documentation

## 2017-08-04 DIAGNOSIS — E1165 Type 2 diabetes mellitus with hyperglycemia: Secondary | ICD-10-CM | POA: Insufficient documentation

## 2017-08-17 ENCOUNTER — Encounter: Payer: Self-pay | Admitting: Family Medicine

## 2017-08-17 ENCOUNTER — Ambulatory Visit (INDEPENDENT_AMBULATORY_CARE_PROVIDER_SITE_OTHER): Payer: Medicare HMO | Admitting: Family Medicine

## 2017-08-17 VITALS — BP 130/72 | HR 81 | Temp 98.2°F | Resp 16 | Ht 65.0 in | Wt 180.6 lb

## 2017-08-17 DIAGNOSIS — R748 Abnormal levels of other serum enzymes: Secondary | ICD-10-CM

## 2017-08-17 DIAGNOSIS — E118 Type 2 diabetes mellitus with unspecified complications: Secondary | ICD-10-CM

## 2017-08-17 DIAGNOSIS — R109 Unspecified abdominal pain: Secondary | ICD-10-CM | POA: Diagnosis not present

## 2017-08-17 DIAGNOSIS — R0789 Other chest pain: Secondary | ICD-10-CM

## 2017-08-17 LAB — COMPLETE METABOLIC PANEL WITH GFR
AG Ratio: 1.8 (calc) (ref 1.0–2.5)
ALBUMIN MSPROF: 4.4 g/dL (ref 3.6–5.1)
ALT: 11 U/L (ref 6–29)
AST: 12 U/L (ref 10–35)
Alkaline phosphatase (APISO): 101 U/L (ref 33–130)
BUN: 15 mg/dL (ref 7–25)
CALCIUM: 9.9 mg/dL (ref 8.6–10.4)
CO2: 27 mmol/L (ref 20–32)
CREATININE: 0.86 mg/dL (ref 0.50–0.99)
Chloride: 107 mmol/L (ref 98–110)
GFR, EST AFRICAN AMERICAN: 82 mL/min/{1.73_m2} (ref >=60)
GFR, EST NON AFRICAN AMERICAN: 71 mL/min/{1.73_m2} (ref >=60)
GLUCOSE: 110 mg/dL — AB (ref 65–99)
Globulin: 2.4 g/dL (calc) (ref 1.9–3.7)
Potassium: 4.1 mmol/L (ref 3.5–5.3)
Sodium: 142 mmol/L (ref 135–146)
Total Bilirubin: 0.3 mg/dL (ref 0.2–1.2)
Total Protein: 6.8 g/dL (ref 6.1–8.1)

## 2017-08-17 LAB — LIPASE: LIPASE: 43 U/L (ref 7–60)

## 2017-08-17 MED ORDER — INSULIN GLARGINE 100 UNITS/ML SOLOSTAR PEN: PEN_INJECTOR | SUBCUTANEOUS | 11 refills | Status: DC

## 2017-08-17 MED ORDER — METFORMIN HCL 500 MG PO TABS: 500.0000 mg | ORAL_TABLET | Freq: Two times a day (BID) | ORAL | 3 refills | Status: DC

## 2017-08-17 MED ORDER — PANTOPRAZOLE SODIUM 40 MG PO TBEC: 40.0000 mg | DELAYED_RELEASE_TABLET | Freq: Every day | ORAL | 1 refills | Status: DC

## 2017-08-17 NOTE — Patient Instructions (Addendum)
Get CT done of Chest  Get eye exam done  Follow up with GI and endocrinologist for diabetes management and abdominal pain eval.    Insulin Treatment for Diabetes Diabetes (diabetes mellitus) is a long-term (chronic) disease. It occurs when the body does not properly use sugar (glucose) that is released from food after digestion. Glucose levels are controlled by a hormone called insulin, which is made in the pancreas.  If you have type 1 diabetes, the pancreas does not make any insulin, so you must take insulin.  If you have type 2 diabetes, you might need to take insulin along with other medicines. In type 2 diabetes, one or both of these problems may be present: ? The pancreas does not make enough insulin. ? Cells in the body do not respond properly to insulin that the body makes (insulin resistance).  You must use insulin correctly to control your diabetes. You must have some insulin in your body at all times. Insulin treatment varies depending on your type of diabetes, your treatment goals, and your medical history. It is important for you to understand your insulin treatment plan so you can be an active partner in managing your diabetes. How is insulin given? Insulin can only be given through a shot (injection). It is injected using a syringe and needle, an insulin pen, a pump, or a jet injector. Your health care provider will:  Prescribe the amount and type of insulin that you need.  Tell you when you should inject your insulin.  Where on the body should insulin be injected? Insulin is injected into a layer of fatty tissue under the skin. Good places to inject insulin include:  Abdomen. Generally, the abdomen is the best place to inject insulin. However, you should avoid any area that is less than 2 inches (5 cm) from the belly button (navel).  Front and outer area of the upper thighs.  The back of the upper arms.  Upper buttocks.  It is important to:  Give your injection  in a slightly different place each time. This helps to prevent irritation and improve absorption.  Avoid injecting into areas that have scar tissue.  Usually, you will give yourself insulin injections. Others can also be taught how to give you injections. You will use a special type of syringe that is made only for insulin. Some people may have an insulin pump that delivers insulin steadily through a tube (cannula) that is placed under the skin. What are the different types of insulin? The following information is a general guide to different types of insulin. Specifics vary depending on the insulin product that your health care provider prescribes.  Rapid-acting insulin: ? Starts working quickly, in as little as 5 minutes. ? Can last for 4-6 hours, or sometimes longer. ? Works well when taken right before a meal to quickly lower blood glucose.  Short-acting insulin: ? Starts working in about 30 minutes. ? Can last for 6-10 hours. ? Should be taken about 30 minutes before you start eating a meal.  Intermediate-acting insulin: ? Starts working in 1-2 hours. ? Lasts for about 10-18 hours. ? Lowers your blood glucose for a longer period of time but is not as effective for lowering blood glucose right after a meal.  Long-acting insulin: ? Mimics the small amount of insulin that your pancreas usually produces throughout the day. ? Should be used either one or two times a day. ? Is usually used in combination with other types of insulin  or other medicines.  Concentrated insulin, or U-500 insulin: ? Contains a higher dose of insulin than most rapid-acting insulins. U-500 insulin has 5 times the amount of insulin per 1 mL. ? Should only be used with the special U-500 syringe or U-500 insulin pen. It is dangerous to use the wrong type of syringe with this insulin.  What are the side effects of insulin? Possible side effects of insulin treatment include:  Low blood glucose  (hypoglycemia).  Weight gain.  High blood glucose (hyperglycemia).  Skin injury or irritation.  Some of these side effects can be caused by using improper injection technique. It is important to learn to inject insulin properly. What are common terms associated with insulin treatment? Some terms that you might hear include:  Basal insulin, or basal rate. This is the constant amount of insulin that needs to be present in your body to stabilize your blood glucose levels. People who have type 1 diabetes need basal insulin in a steady (continuous) dose 24 hours a day. ? Usually, intermediate-acting or long-acting insulin is used one or two times a day to manage basal insulin levels. ? Medicines that are taken by mouth may also be recommended to manage basal insulin levels.  Prandial insulin. This refers to meal-related insulin. ? Blood glucose rises quickly after a meal (postprandial). Rapid-acting or short-acting insulin can be used right before a meal (preprandial) to quickly lower blood glucose. ? You may be instructed to adjust the amount of prandial insulin that you take depending on how much carbohydrate (starch) is in your meal.  Corrective insulin. This may also be called a correction dose or supplemental dose. This is a small amount of rapid-acting or short-acting insulin that can be used to lower blood glucose if it is too high. You may be instructed to check your blood glucose at certain times of the day and use corrective insulin as needed.  Tight control, or intensive therapy. This means keeping your blood glucose as close to your target as possible, and preventing it from getting too high after meals. People who have tight control of their diabetes have fewer long-term problems caused by diabetes.  General instructions  Talk with your health care provider or pharmacist about the type of insulin you should take and when you should take it. You should know when your insulin peaks  and when it wears off. You need this information so you can plan your meals and exercise. You also need to work with your health care provider to:  Check your blood glucose every day. Your health care provider will tell you how often and when you should do this.  Manage your: ? Weight. ? Blood pressure. ? Cholesterol. ? Stress.  Eat a healthy diet.  Exercise regularly.  This information is not intended to replace advice given to you by your health care provider. Make sure you discuss any questions you have with your health care provider. Document Released: 04/03/2008 Document Revised: 06/13/2015 Document Reviewed: 02/08/2015 [employer]vier Interactive Patient Education  [employer]2018 [employer]

## 2017-08-17 NOTE — Progress Notes (Signed)
Patient ID: Destiny Hurley, female    DOB: 10-16-1950, 67 y.o.   MRN: [id number]  PCP: Danelle Berry, PA-C  Chief Complaint  Patient presents with  . 2 week follow up with abdominal pain  . Diabetes    Subjective:   Destiny Hurley is a 67 y.o. female, presents to clinic with CC of diabetes recheck with recent DM med changes secondary to pancreatitis and is also here for continued abdominal pain.  Abdominal pain is continued and severe.  Tramadol and tylenol did not help pain at all.  Pain continues to be constant with worsening after eating so she has been avoiding eating and it trying to eat one meal a day.  She believes she has been taking PPI in the morning but there has been no improvement with pain.  Pain continues to be located to right side located to about right 9th intercostal space in midaxillary line but she also points to RUQ and epigastrium.  She also continues to rub her entire abdomen and say it hurts everywhere as well, w/o radiation to back.  Pain is also exacerbated by laying supine.  No other alleviating or aggravating factors.  Her Janumet was discontinued secondary to elevated lipase and pancreatitis.  At her last visit we discussed watching carefully her fasting blood sugars and increasing her Lantus dosing with elevated fasting blood glucose.  She brings in her log of sugars today, see below.  She did not notice any change to abdominal pain with change of medications.  She was instructed to avoid NSAIDs which it sounds like she has been compliant with.  She was instructed to take same dose of Tylenol per day of under 3000 mg she has also been compliant with this.  She has taken tramadol and states that it provided no relief whatsoever.  She has had no changes to her bowel movements.  She continues to feel slightly bloated and distended in her abdomen.  She denies any diarrhea, melena, hematochezia, nausea,, flank pain, dysuria, vaginal discharge. She denies any pain  with deep inspiration.    She was referred to gastroenterology, has an upcoming appointment.    Diabetes Mellitus Type II, Follow-up:   Lab Results  Component Value Date   HGBA1C 8.3 (H) 07/29/2017   HGBA1C 10.9 04/16/2017    Last seen for diabetes 4 months ago with specialist that she no longer wants to see. Management since then includes Lantus, invokana, stopped Janumet with pancreatitis (2 weeks ago), was to watch FBS and titrate up lantus dose when FBS > 180. She reports good compliance with increasing Lantus dose but states that her fasting sugars were not low enough and she began taking Janumet again a few days ago.   She is not having side effects. No sx with hyperglycemia.  No hypoglycemic episodes or measurements.  She did not bring her glucometer with her. Current symptoms include none and have been unchanged. Home fasting blood sugar records: fasting range: 127-270  Episodes of hypoglycemia? no   Current Insulin Regimen: 20 units lantus, increased up to now 30 units given at night Most Recent Eye Exam: 08/10/2016 - eye dr. [facility] [place] Weight trend: stable Prior visit with dietician: yes - with [facility], but she no longer wishes to see any [facility] internist, endocrinologist or nutritionist, even though she was recently established with severely uncontrolled diabetes and A1C has drastically improved with their management. Current diet: in general, a "healthy" diet   lower sugar  diet cut out sugary drinks, now with abdominal pain with eating she is only eating once a day. Current exercise: none  She has been referred to a new endocrinologist and has an appointment in the next month  Pertinent Labs:    Component Value Date/Time   CHOL 185 07/29/2017 1014   TRIG 249 (H) 07/29/2017 1014   HDL 74 07/29/2017 1014   LDLCALC 78 07/29/2017 1014   CREATININE 1.12 (H) 07/30/2017 2157   CREATININE 0.89 07/29/2017 1014    Wt Readings from Last 3 Encounters:    08/17/17 180 lb 9.6 oz (81.9 kg)  08/03/17 178 lb 12.8 oz (81.1 kg)  07/29/17 176 lb (79.8 kg)    ------------------------------------------------------------------------    Patient Active Problem List   Diagnosis Date Noted  . Abdominal pain 08/04/2017  . Elevated lipase 08/04/2017  . Uncontrolled type 2 diabetes mellitus with hyperglycemia (HCC) 08/04/2017  . Heart murmur 08/04/2017  . Schatzki's ring 08/04/2017  . Pancreatitis 07/30/2017  . Swelling of right foot 07/08/2017  . Dysuria 07/08/2017  . Left thigh pain 06/11/2017  . Weight loss 04/08/2017  . Proximal limb muscle weakness 04/01/2017  . Type 2 diabetes mellitus with diabetic neuropathy (HCC) 04/01/2017  . Hypertension 04/01/2017  . Chronic diarrhea 04/01/2017  . Lumbar disc disease 03/17/2017  . Chronic painful diabetic neuropathy (HCC) 06/20/2014     Prior to Admission medications   Medication Sig Start Date End Date Taking? Authorizing Provider  canagliflozin (INVOKANA) 300 MG TABS tablet Take 300 mg by mouth daily. 12/11/14  Yes [provider]  gabapentin (NEURONTIN) 600 MG tablet Take 2 tablets (1,200 mg total) by mouth 2 (two) times daily. 07/08/17 10/06/17 Yes Darreld Mclean, MD  insulin glargine (LANTUS) 100 unit/mL SOPN Inject 0.2 mLs (20 Units total) into the skin at bedtime. Patient taking differently: Inject 20 Units into the skin at bedtime. Patient taking 30 units 06/11/17  Yes Arnetha Courser, MD  Insulin Pen Needle 32G X 4 MM MISC 10 Units by Does not apply route daily. 04/01/17  Yes Arnetha Courser, MD  lisinopril (PRINIVIL,ZESTRIL) 10 MG tablet Take 1 tablet (10 mg total) by mouth daily. 04/01/17  Yes Arnetha Courser, MD  lovastatin (MEVACOR) 20 MG tablet Take 1 tablet (20 mg total) by mouth daily. 05/18/17  Yes Arnetha Courser, MD  pantoprazole (PROTONIX) 20 MG tablet Take 1 tablet (20 mg total) by mouth daily. 08/03/17  Yes Danelle Berry, PA-C  polyethylene glycol (MIRALAX / GLYCOLAX) packet Take 17  g by mouth daily.   Yes [provider]  sucralfate (CARAFATE) 1 g tablet Take 1 tablet (1 g total) by mouth 4 (four) times daily -  with meals and at bedtime. 07/31/17  Yes Roxy Horseman, PA-C  vitamin C (ASCORBIC ACID) 500 MG tablet Take 500 mg by mouth daily.   Yes [provider]  diclofenac sodium (VOLTAREN) 1 % GEL APPLY 4 GRAMS TOPICALLY TO AFFECTED AREA 4 TIMES DAILY 07/08/17   [provider]     Allergies  Allergen Reactions  . Duloxetine Rash  . Dulaglutide Nausea Only and Nausea And Vomiting  . Hydrocodone     Edema   . Lyrica [Pregabalin]     Edema     Family History  Problem Relation Age of Onset  . Heart disease Unknown   . Arthritis Unknown   . Lung disease Unknown   . Cancer Unknown   . Kidney disease Unknown   . Diabetes Unknown  Social History   Socioeconomic History  . Marital status: Single    Spouse name: Not on file  . Number of children: Not on file  . Years of education: Not on file  . Highest education level: Not on file  Occupational History  . Not on file  Social Needs  . Financial resource strain: Not on file  . Food insecurity:    Worry: Not on file    Inability: Not on file  . Transportation needs:    Medical: Not on file    Non-medical: Not on file  Tobacco Use  . Smoking status: Never Smoker  . Smokeless tobacco: Current User    Types: Chew  . Tobacco comment: Chews 1 pack per day  Substance and Sexual Activity  . Alcohol use: Not on file  . Drug use: No  . Sexual activity: Not Currently  Lifestyle  . Physical activity:    Days per week: Not on file    Minutes per session: Not on file  . Stress: Not on file  Relationships  . Social connections:    Talks on phone: Not on file    Gets together: Not on file    Attends religious service: Not on file    Active member of club or organization: Not on file    Attends meetings of clubs or organizations: Not on file    Relationship status: Not  on file  . Intimate partner violence:    Fear of current or ex partner: Not on file    Emotionally abused: Not on file    Physically abused: Not on file    Forced sexual activity: Not on file  Other Topics Concern  . Not on file  Social History Narrative  . Not on file     Review of Systems  Constitutional: Negative.  Negative for activity change, appetite change, chills, diaphoresis, fatigue, fever and unexpected weight change.  HENT: Negative.   Eyes: Negative.   Respiratory: Negative.  Negative for shortness of breath.   Cardiovascular: Positive for chest pain. Negative for palpitations and leg swelling.  Gastrointestinal: Positive for abdominal pain. Negative for anal bleeding, blood in stool, constipation, diarrhea, nausea, rectal pain and vomiting.  Endocrine: Negative for polydipsia and polyphagia.  Genitourinary: Negative.   Musculoskeletal: Negative.   Skin: Negative.   Neurological: Negative.   Hematological: Negative.   Psychiatric/Behavioral: Negative.   All other systems reviewed and are negative.      Objective:    Vitals:   08/17/17 1008  BP: 130/72  Pulse: 81  Resp: 16  Temp: 98.2 F (36.8 C)  TempSrc: Oral  SpO2: 97%  Weight: 180 lb 9.6 oz (81.9 kg)  Height: 5\' 5"  (1.651 m)      Physical Exam  Constitutional: She is oriented to person, place, and time. She appears well-developed and well-nourished.  Non-toxic appearance. No distress.  Obese, chronically ill appearing AAF, appears older than stated age, no acute distress  HENT:  Head: Normocephalic and atraumatic.  Right Ear: External ear normal.  Left Ear: External ear normal.  Nose: Nose normal.  Mouth/Throat: Uvula is midline and mucous membranes are normal.  MMM  Eyes: Pupils are equal, round, and reactive to light. Conjunctivae, EOM and lids are normal. No scleral icterus.  Neck: Normal range of motion and phonation normal. Neck supple. No tracheal deviation present.  Cardiovascular:  Normal rate, regular rhythm, normal heart sounds, intact distal pulses and normal pulses. PMI is not displaced. Exam reveals  no gallop and no friction rub.  No murmur heard. Pulses:      Radial pulses are 2+ on the right side, and 2+ on the left side.       Posterior tibial pulses are 2+ on the right side, and 2+ on the left side.  Pulmonary/Chest: Effort normal and breath sounds normal. No stridor. No respiratory distress. She has no decreased breath sounds. She has no wheezes. She has no rhonchi. She has no rales. Chest wall is not dull to percussion. She exhibits tenderness. She exhibits no mass, no bony tenderness, no crepitus, no edema, no swelling and no retraction.  Area of max pain and mildly ttp (see picture)    Abdominal: Soft. Normal appearance and bowel sounds are normal. She exhibits no distension, no abdominal bruit, no pulsatile midline mass and no mass. There is no hepatosplenomegaly. There is no tenderness. There is no rigidity, no rebound, no guarding, no CVA tenderness, no tenderness at McBurney's point and negative Murphy's sign. No hernia.  Obese soft protuberant abdomen, transverse abdominal surgical scar to right abdomen, no CVA tenderness  Musculoskeletal: Normal range of motion. She exhibits no edema or deformity.  Lymphadenopathy:    She has no cervical adenopathy.  Neurological: She is alert and oriented to person, place, and time. She exhibits normal muscle tone. Gait normal.  Skin: Skin is warm, dry and intact. Capillary refill takes less than 2 seconds. No rash noted. She is not diaphoretic. No pallor.  Psychiatric: She has a normal mood and affect. Her speech is normal and behavior is normal.  Nursing note and vitals reviewed.         Assessment & Plan:      ICD-10-CM   1. Type 2 diabetes mellitus with complication, without long-term current use of insulin (HCC) E11.8 metFORMIN (GLUCOPHAGE) 500 MG tablet    COMPLETE METABOLIC PANEL WITH GFR    Lipase     insulin glargine (LANTUS) 100 unit/mL SOPN  2. Abdominal pain, unspecified abdominal location R10.9 COMPLETE METABOLIC PANEL WITH GFR    Lipase    pantoprazole (PROTONIX) 40 MG tablet   con't abd pain, CT negative, tx with protonix, con't carafate, ddx: GERD, gastritis, esophagitis, PUD.  GI referral.  no NSAIDs, tramadol for pain  3. Elevated lipase R74.8 COMPLETE METABOLIC PANEL WITH GFR    Lipase  4. Type 2 diabetes mellitus with diabetic neuropathy, unspecified whether long term insulin use (HCC) E11.40 insulin glargine (LANTUS) 100 unit/mL SOPN  5. Other chest pain R07.89 CT Chest Wo Contrast    Abd pain: con't abd pain x 10 months, rodeo abd pain, more severe to RUQ, right lateral ribs and epigastrium, consistantly exacerbated by eating regardless of food type and with laying down.  Initial labs were concerning for pancreatitis, ER workup showed improving labs and CT negative.   Repeat labs today - CMT lipase  Discontinued Janumet last month, increased basal insulin to cover, rx today to start on metformin again, Pt does not sound very sure of which PPI she is taking, but confirms she is taking it in the morning.  I had previously discontinued omeprazole after the ER canceled Protonix and I again told her to use get the Protonix 20 mg and take it rather than omeprazole.  But again I am not sure if she has been taking omeprazole or Protonix  20 mg, and regardless will increase to Protonix 40 mg GI consult pending ddx: still includes GERD with gastritis and/or esophagitis, PUD -  however would have hoped to see some change to her pain at this point with improving lipase, negative Ct and several weeks of PPI use, no NSAIDS. DDx possibly ischemia to bowels/mesentary - would need CTA of abd/pelvis.  Will defer to GI for other imaging modalities for abd.  Pt does point to chest and lateral ribs for her most severe and recurrent location of pain.  CV and pulm exam is unremarkable and prior CXR and  EKG also unremarkable.  With positional changes may have some intrathoracic component and/or referred pain?  CT chest w/o contrast to eval.  May have some chest wall, pleuritic or pericardial involvement?  Pt had no relief with tramadol, I am hesitant to prescribe anything stronger with absolutely 0 change to pain.  With her recent PCP and specialist switching, I am a little cautious that she may be seeking narcs.  She had excellent and established care with multiple specialists a few months ago and I am working very hard (as is referral specialist Theodora Blow) to redo all of that because she refuses to see the same providers for specialist care.   Danelle Berry, PA-C 08/17/17 10:14 AM

## 2017-08-18 NOTE — Progress Notes (Signed)
Please notify pt Labs look great, everything has improved and is in normal range

## 2017-09-01 DIAGNOSIS — Z9049 Acquired absence of other specified parts of digestive tract: Secondary | ICD-10-CM | POA: Diagnosis not present

## 2017-09-01 DIAGNOSIS — Z8639 Personal history of other endocrine, nutritional and metabolic disease: Secondary | ICD-10-CM | POA: Diagnosis not present

## 2017-09-01 DIAGNOSIS — R1013 Epigastric pain: Secondary | ICD-10-CM | POA: Diagnosis not present

## 2017-09-01 DIAGNOSIS — Z800 Family history of malignant neoplasm of digestive organs: Secondary | ICD-10-CM | POA: Diagnosis not present

## 2017-09-06 ENCOUNTER — Encounter: Payer: Self-pay | Admitting: Family Medicine

## 2017-09-07 DIAGNOSIS — Z7984 Long term (current) use of oral hypoglycemic drugs: Secondary | ICD-10-CM | POA: Diagnosis not present

## 2017-09-07 DIAGNOSIS — E119 Type 2 diabetes mellitus without complications: Secondary | ICD-10-CM | POA: Diagnosis not present

## 2017-09-07 DIAGNOSIS — R1010 Upper abdominal pain, unspecified: Secondary | ICD-10-CM | POA: Diagnosis not present

## 2017-09-07 DIAGNOSIS — E7800 Pure hypercholesterolemia, unspecified: Secondary | ICD-10-CM | POA: Diagnosis not present

## 2017-09-07 DIAGNOSIS — Z79899 Other long term (current) drug therapy: Secondary | ICD-10-CM | POA: Diagnosis not present

## 2017-09-07 DIAGNOSIS — I10 Essential (primary) hypertension: Secondary | ICD-10-CM | POA: Diagnosis not present

## 2017-09-07 DIAGNOSIS — Z87891 Personal history of nicotine dependence: Secondary | ICD-10-CM | POA: Diagnosis not present

## 2017-09-07 DIAGNOSIS — R1011 Right upper quadrant pain: Secondary | ICD-10-CM | POA: Diagnosis not present

## 2017-09-07 DIAGNOSIS — R1012 Left upper quadrant pain: Secondary | ICD-10-CM | POA: Diagnosis not present

## 2017-09-07 DIAGNOSIS — Z888 Allergy status to other drugs, medicaments and biological substances status: Secondary | ICD-10-CM | POA: Diagnosis not present

## 2017-09-22 ENCOUNTER — Ambulatory Visit (INDEPENDENT_AMBULATORY_CARE_PROVIDER_SITE_OTHER): Payer: PRIVATE HEALTH INSURANCE | Admitting: "Endocrinology

## 2017-09-22 ENCOUNTER — Encounter: Payer: Self-pay | Admitting: "Endocrinology

## 2017-09-22 VITALS — BP 133/84 | HR 80 | Ht 65.0 in | Wt 183.0 lb

## 2017-09-22 DIAGNOSIS — E118 Type 2 diabetes mellitus with unspecified complications: Secondary | ICD-10-CM | POA: Diagnosis not present

## 2017-09-22 DIAGNOSIS — I10 Essential (primary) hypertension: Secondary | ICD-10-CM | POA: Diagnosis not present

## 2017-09-22 DIAGNOSIS — E782 Mixed hyperlipidemia: Secondary | ICD-10-CM

## 2017-09-22 DIAGNOSIS — E1165 Type 2 diabetes mellitus with hyperglycemia: Secondary | ICD-10-CM

## 2017-09-22 MED ORDER — METFORMIN HCL 1000 MG PO TABS: 1000.0000 mg | ORAL_TABLET | Freq: Two times a day (BID) | ORAL | 3 refills | Status: DC

## 2017-09-22 NOTE — Patient Instructions (Signed)

## 2017-09-22 NOTE — Progress Notes (Signed)
Endocrinology Consult Note       09/22/2017, 12:04 PM   Subjective:    Patient ID: Destiny Hurley, female    DOB: 1950/04/30.  Destiny Hurley is being seen in consultation for management of currently uncontrolled symptomatic diabetes requested by  Danelle Berry, PA-C.   Past Medical History:  Diagnosis Date  . Adrenal mass (HCC) 2012   Destiny Hurley on CT  . Chronic painful diabetic neuropathy (HCC)   . Depression    situational  . Diabetes mellitus   . Dysplasia of tongue   . Esophageal mass   . Heart murmur    Echo 2/09 revealing aortic sclerosis without gradient  . Hyperlipemia   . Hypertension   . Left shoulder pain    Past Surgical History:  Procedure Laterality Date  . CHOLECYSTECTOMY    . COLONOSCOPY    . COLONOSCOPY WITH PROPOFOL N/A 05/05/2017   Procedure: COLONOSCOPY WITH PROPOFOL;  Surgeon: Toledo, Boykin Nearing, MD;  Location: [facility] ENDOSCOPY;  Service: Gastroenterology;  Laterality: N/A;  . schatzki's ring     s/p dilation 2/04  . TUBAL LIGATION     Social History   Socioeconomic History  . Marital status: Single    Spouse name: Not on file  . Number of children: Not on file  . Years of education: Not on file  . Highest education level: Not on file  Occupational History  . Not on file  Social Needs  . Financial resource strain: Not on file  . Food insecurity:    Worry: Not on file    Inability: Not on file  . Transportation needs:    Medical: Not on file    Non-medical: Not on file  Tobacco Use  . Smoking status: Never Smoker  . Smokeless tobacco: Current User    Types: Chew  . Tobacco comment: Chews 1 pack per day  Substance and Sexual Activity  . Alcohol use: Not on file  . Drug use: No  . Sexual activity: Not Currently  Lifestyle  . Physical activity:    Days per week: Not on file    Minutes per session: Not on file  . Stress: Not on file  Relationships   . Social connections:    Talks on phone: Not on file    Gets together: Not on file    Attends religious service: Not on file    Active member of club or organization: Not on file    Attends meetings of clubs or organizations: Not on file    Relationship status: Not on file  Other Topics Concern  . Not on file  Social History Narrative  . Not on file   Outpatient Encounter Medications as of 09/22/2017  Medication Sig  . diclofenac sodium (VOLTAREN) 1 % GEL APPLY 4 GRAMS TOPICALLY TO AFFECTED AREA 4 TIMES DAILY  . gabapentin (NEURONTIN) 600 MG tablet Take 2 tablets (1,200 mg total) by mouth 2 (two) times daily.  Marland Kitchen lisinopril (PRINIVIL,ZESTRIL) 10 MG tablet Take 1 tablet (10 mg total) by mouth daily.  Marland Kitchen lovastatin (MEVACOR) 20 MG tablet Take 1 tablet (20 mg total) by  mouth daily.  . metFORMIN (GLUCOPHAGE) 1000 MG tablet Take 1 tablet (1,000 mg total) by mouth 2 (two) times daily with a meal.  . pantoprazole (PROTONIX) 40 MG tablet Take 1 tablet (40 mg total) by mouth daily.  . sucralfate (CARAFATE) 1 g tablet Take 1 tablet (1 g total) by mouth 4 (four) times daily -  with meals and at bedtime.  . vitamin B-12 (CYANOCOBALAMIN) 500 MCG tablet Take 500 mcg by mouth daily.  . vitamin C (ASCORBIC ACID) 500 MG tablet Take 500 mg by mouth daily.  . [DISCONTINUED] canagliflozin (INVOKANA) 300 MG TABS tablet Take 300 mg by mouth daily.  . [DISCONTINUED] metFORMIN (GLUCOPHAGE) 500 MG tablet Take 1 tablet (500 mg total) by mouth 2 (two) times daily with a meal.  . insulin glargine (LANTUS) 100 unit/mL SOPN Take 20-40 units into skin at bedtime, titrating lantus dose for FBS >180 (Patient not taking: Reported on 09/22/2017)  . Insulin Hurley Needle 32G X 4 MM MISC 10 Units by Does not apply route daily.  . traMADol (ULTRAM) 50 MG tablet Take 50-100 mg by mouth every 8 (eight) hours as needed.  . [DISCONTINUED] polyethylene glycol (MIRALAX / GLYCOLAX) packet Take 17 g by mouth daily.  . [DISCONTINUED]  pregabalin (LYRICA) 75 MG capsule 3 (three) times daily.  . [DISCONTINUED] sitaGLIPtin (JANUVIA) 100 MG tablet daily.  . [DISCONTINUED] SitaGLIPtin-MetFORMIN HCl (JANUMET XR) 50-1000 MG TB24 2 (two) times daily.   No facility-administered encounter medications on file as of 09/22/2017.     ALLERGIES: Allergies  Allergen Reactions  . Duloxetine Rash  . Sitagliptin Other (See Comments)    pancreatitis  . Dulaglutide Nausea Only and Nausea And Vomiting  . Hydrocodone     Edema   . Lyrica [Pregabalin]     Edema    VACCINATION STATUS:  There is no immunization history on file for this patient.  Diabetes  She presents for her initial diabetic visit. She has type 2 diabetes mellitus. Onset time: She was diagnosed at approximate age of 81 years. Her disease course has been fluctuating. There are no hypoglycemic associated symptoms. Pertinent negatives for hypoglycemia include no confusion, headaches, pallor or seizures. Associated symptoms include fatigue, polyphagia and polyuria. Pertinent negatives for diabetes include no chest pain and no polydipsia. There are no hypoglycemic complications. Symptoms are worsening. There are no diabetic complications. Risk factors for coronary artery disease include diabetes mellitus, dyslipidemia, family history, hypertension, obesity, sedentary lifestyle, post-menopausal and tobacco exposure. Current diabetic treatment includes oral agent (dual therapy). Compliance with diabetes treatment: She is currently on metformin 500 mg p.o. twice daily, Invokana 300 mg p.o. daily. Her weight is increasing steadily. She is following a generally unhealthy diet. When asked about meal planning, she reported none. She has not had a previous visit with a dietitian (Patient declined a consult.). She rarely participates in exercise. An ACE inhibitor/angiotensin II receptor blocker is being taken. Eye exam is not current.  Hyperlipidemia  This is a chronic problem. The current  episode started more than 1 year ago. The problem is uncontrolled. Exacerbating diseases include diabetes and obesity. Pertinent negatives include no chest pain, myalgias or shortness of breath. Current antihyperlipidemic treatment includes statins. Risk factors for coronary artery disease include dyslipidemia, diabetes mellitus, hypertension, a sedentary lifestyle, post-menopausal and obesity.  Hypertension  This is a chronic problem. The current episode started more than 1 year ago. The problem is controlled. Pertinent negatives include no chest pain, headaches, palpitations or shortness of breath. Risk  factors for coronary artery disease include dyslipidemia, diabetes mellitus, obesity, sedentary lifestyle, smoking/tobacco exposure, family history and post-menopausal state. Past treatments include ACE inhibitors.      Review of Systems  Constitutional: Positive for fatigue. Negative for chills, fever and unexpected weight change.  HENT: Negative for trouble swallowing and voice change.   Eyes: Negative for visual disturbance.  Respiratory: Negative for cough, shortness of breath and wheezing.   Cardiovascular: Negative for chest pain, palpitations and leg swelling.  Gastrointestinal: Negative for diarrhea, nausea and vomiting.  Endocrine: Positive for polyphagia and polyuria. Negative for cold intolerance, heat intolerance and polydipsia.  Musculoskeletal: Negative for arthralgias and myalgias.  Skin: Negative for color change, pallor, rash and wound.  Neurological: Negative for seizures and headaches.  Psychiatric/Behavioral: Negative for confusion and suicidal ideas.    Objective:    BP 133/84   Pulse 80   Ht 5\' 5"  (1.651 m)   Wt 183 lb (83 kg)   BMI 30.45 kg/m   Wt Readings from Last 3 Encounters:  09/22/17 183 lb (83 kg)  08/17/17 180 lb 9.6 oz (81.9 kg)  08/03/17 178 lb 12.8 oz (81.1 kg)     Physical Exam  Constitutional: She is oriented to person, place, and time. She  appears well-developed.  HENT:  Head: Normocephalic and atraumatic.  Eyes: EOM are normal.  Neck: Normal range of motion. Neck supple. No tracheal deviation present. No thyromegaly present.  Cardiovascular: Normal rate and regular rhythm.  Pulmonary/Chest: Effort normal and breath sounds normal.  Abdominal: Soft. Bowel sounds are normal. There is no tenderness. There is no guarding.  Musculoskeletal: Normal range of motion. She exhibits no edema.  Neurological: She is alert and oriented to person, place, and time. She has normal reflexes. No cranial nerve deficit. Coordination normal.  Skin: Skin is warm and dry. No rash noted. No erythema. No pallor.  Psychiatric: She has a normal mood and affect. Judgment normal.    CMP ( most recent) CMP     Component Value Date/Time   NA 142 08/17/2017 1053   NA 141 06/11/2017 1655   K 4.1 08/17/2017 1053   CL 107 08/17/2017 1053   CO2 27 08/17/2017 1053   GLUCOSE 110 (H) 08/17/2017 1053   BUN 15 08/17/2017 1053   BUN 16 06/11/2017 1655   CREATININE 0.86 08/17/2017 1053   CALCIUM 9.9 08/17/2017 1053   PROT 6.8 08/17/2017 1053   PROT 7.6 06/11/2017 1655   ALBUMIN 4.1 07/30/2017 2157   ALBUMIN 5.0 (H) 06/11/2017 1655   AST 12 08/17/2017 1053   ALT 11 08/17/2017 1053   ALKPHOS 105 07/30/2017 2157   BILITOT 0.3 08/17/2017 1053   BILITOT <0.2 06/11/2017 1655   GFRNONAA 71 08/17/2017 1053   GFRAA 82 08/17/2017 1053     Diabetic Labs (most recent): Lab Results  Component Value Date   HGBA1C 8.3 (H) 07/29/2017   HGBA1C 10.9 04/16/2017     Lipid Panel ( most recent) Lipid Panel     Component Value Date/Time   CHOL 185 07/29/2017 1014   TRIG 249 (H) 07/29/2017 1014   HDL 74 07/29/2017 1014   CHOLHDL 2.5 07/29/2017 1014   LDLCALC 78 07/29/2017 1014      Assessment & Plan:   1. Uncontrolled type 2 diabetes mellitus with hyperglycemia (HCC)  - Destiny Hurley has currently uncontrolled symptomatic type 2 DM since 67 years  of age,  with most recent A1c of 8.3% %, improving from 10.9% in March  2019.  Recent labs reviewed.  -She did not report gross complications from her diabetes, however Destiny Hurley remains at a high risk for more acute and chronic complications which include CAD, CVA, CKD, retinopathy, and neuropathy. These are all discussed in detail with the patient.  - I have counseled her on diet management and weight loss, by adopting a carbohydrate restricted/protein rich diet.  - Suggestion is made for her to avoid simple carbohydrates  from her diet including Cakes, Sweet Desserts, Ice Cream, Soda (diet and regular), Sweet Tea, Candies, Chips, Cookies, Store Bought Juices, Alcohol in Excess of  1-2 drinks a day, Artificial Sweeteners, and "Sugar-free" Products. This will help patient to have stable blood glucose profile and potentially avoid unintended weight gain.  - I encouraged her to switch to  unprocessed or minimally processed complex starch and increased protein intake (animal or plant source), fruits, and vegetables.  - she is advised to stick to a routine mealtimes to eat 3 meals  a day and avoid unnecessary snacks ( to snack only to correct hypoglycemia).    - I have approached her with the following individualized plan to manage diabetes and patient agrees:   -She describes vague side effects from Lantus including swelling bilateral ankles, may require at least basal insulin in order for her to achieve and maintain control of diabetes to target.  -In preparation, I approach her to start monitoring blood glucose 4 times a day-before meals and at bedtime and return in 10 days with her meter and logs for reevaluation.   -Patient is encouraged to call clinic for blood glucose levels less than 70 or above 300 mg /dl. -She will continue to benefit from metformin therapy.  I discussed and increase metformin to 1000 mg p.o. twice daily after breakfast and supper, therapeutically suitable for  patient . - I will discontinue Invokana, risk outweighs benefit for this patient.  - she is not a suitable candidate for incretin therapy given recent history of pancreatitis.    - Patient specific target  A1c;  LDL, HDL, Triglycerides, and  Waist Circumference were discussed in detail.  2) BP/HTN: Her blood pressure is controlled to target.  She is advised to continue her current blood pressure medications including lisinopril 10 mg daily.    3) Lipids/HPL:   She has hypertriglyceridemia, history of pancreatitis.  She is advised to continue lovastatin 20 mg p.o. nightly.  She will be considered for fenofibrate on subsequent visits.    4)  Weight/Diet: CDE Consult is declined by the patient,  exercise, and detailed carbohydrates information provided.  5) Chronic Care/Health Maintenance:  -she  is on ACEI/ARB and Statin medications and  is encouraged to initiate and continue to follow up with Ophthalmology, Dentist,  Podiatrist at least yearly or according to recommendations, and advised to quit tobacco products. I have recommended yearly flu vaccine and pneumonia vaccine at least every 5 years; moderate intensity exercise for up to 150 minutes weekly; and  sleep for at least 7 hours a day.  - I advised patient to maintain close follow up with Danelle Berry, PA-C for primary care needs.  - Time spent with the patient: 45 minutes, of which >50% was spent in obtaining information about her symptoms, reviewing her previous labs, evaluations, and treatments, counseling her about her currently uncontrolled type 2 diabetes, hyperlipidemia, hypertension, and developing developing  plans for long term treatment based on the latest recommendations.  Christene Slates Lafrance participated in the discussions, expressed understanding,  and voiced agreement with the above plans.  All questions were answered to her satisfaction. she is encouraged to contact clinic should she have any questions or concerns prior to  her return visit.  Follow up plan: - Return in about 10 days (around 10/02/2017) for Follow up with Meter and Logs Only - no Labs.  Marquis Lunch, MD [facility] [address] [place], [place] [address] Phone: [phone]  Fax: [phone]    09/22/2017, 12:04 PM  This note was partially dictated with voice recognition software. Similar sounding words can be transcribed inadequately or may not  be corrected upon review.

## 2017-09-23 ENCOUNTER — Encounter: Payer: Self-pay | Admitting: Family Medicine

## 2017-09-23 ENCOUNTER — Other Ambulatory Visit: Payer: Self-pay

## 2017-09-23 ENCOUNTER — Ambulatory Visit (INDEPENDENT_AMBULATORY_CARE_PROVIDER_SITE_OTHER): Payer: PRIVATE HEALTH INSURANCE | Admitting: Family Medicine

## 2017-09-23 DIAGNOSIS — R109 Unspecified abdominal pain: Secondary | ICD-10-CM | POA: Diagnosis not present

## 2017-09-23 MED ORDER — HYDROCODONE-ACETAMINOPHEN 7.5-325 MG PO TABS: 1.0000 | ORAL_TABLET | Freq: Three times a day (TID) | ORAL | 0 refills | Status: DC | PRN

## 2017-09-23 MED ORDER — PANTOPRAZOLE SODIUM 40 MG PO TBEC: 40.0000 mg | DELAYED_RELEASE_TABLET | Freq: Two times a day (BID) | ORAL | 1 refills | Status: DC

## 2017-09-23 NOTE — Progress Notes (Signed)
Patient ID: Destiny Hurley, female    DOB: 09/29/1950, 67 y.o.   MRN: [id number]  PCP: Danelle Berry, PA-C  Chief Complaint  Patient presents with  . Follow-up    is fasting    Subjective:   Destiny Hurley is a 67 y.o. female, presents to clinic with CC of continued abdominal and rib pain, severe constant, continues to be located predominantly to right upper quadrant of her abdomen, epigastric area, right lower ribs anterior and along mid axillary line with radiation across her upper abdomen and also generalized to everywhere in her abdomen above her umbilicus, pain exacerbated with eating.  Her pain is been so severe that she did go to the ER recently, lab work was done and lipase continued to be normal, they noted that she is taking Tylenol and tramadol for pain and she was not given any other pain medication or treatment at that time.  Patient states today that she has continued to take Protonix and she continues to not have any improvement with pain associated with or directly after eating.  Pain does not radiate to her back.  She has no pain in pelvis she denies any urinary symptoms, flank pain, vaginal symptoms.  She has started to take Motrin for pain in addition to Tylenol.  She continues to feel severely bloated and states that the stretching of her abdomen is causing additional pain and hypersensitivity to her upper abdomen and ribs in her skin which is irritated with just movement of her clothes. She was referred to GI nearly 2 months ago, I currently do not see a appointment scheduled, she states that she is going to "Dr. Leonard Schwartz" and that she did have an appointment scheduled but she canceled it because of some problem with her birthday or Social Security number.  She takes through her bag looking for his card but she cannot find it but states that he is on [employer] in the medical building and she will follow-up with them to get their appointment. CT chest is scheduled for  10/06/17 to further investigate her right lower rib pain. Patient continues to avoid some eating because of worsening pain and abdominal bloating, she denies any nausea or vomiting, denies any diarrhea, melena or hematochezia.  States that she stays constipated having a bowel movement every 1 to 3 days which is not normal for her, she does occasionally use MiraLAX.  She has been to a new endocrinologist, at office visit was reviewed.  At that time she denied going to any nutritional therapy however there is ample documentation with going to specialist at [facility] clinic and in [place] of going to a diabetic specialist with nutritional and group counseling as well as seeing endocrinology for treatment earlier this year. Earlier this year she also went to a gastroenterologist at [facility] clinic and had a colonoscopy done.  She continues to switch specialist and PCP and has done so several times this year.  She relays a story about her internal medicine doctor earlier this year who never touched her in her exams and that is why she left ventricular nodal.  She complains about [facility] stating that they just kept switching her diabetes meds and they did not help her at all however her hemoglobin A1c and improved significantly during the time of management.  Endocrinology has taken her off Lantus and Invokana, she is currently only on metformin with dose increased.  Managed by Dr. Fransico Him with f/up in 2 weeks per pt.  Patient Active Problem List   Diagnosis Date Noted  . Essential hypertension, benign 09/22/2017  . Mixed hyperlipidemia 09/22/2017  . Abdominal pain 08/04/2017  . Elevated lipase 08/04/2017  . Uncontrolled type 2 diabetes mellitus with hyperglycemia (HCC) 08/04/2017  . Heart murmur 08/04/2017  . Schatzki's ring 08/04/2017  . Pancreatitis 07/30/2017  . Swelling of right foot 07/08/2017  . Dysuria 07/08/2017  . Left thigh pain 06/11/2017  . Weight loss 04/08/2017  . Proximal limb muscle  weakness 04/01/2017  . Type 2 diabetes mellitus with diabetic neuropathy (HCC) 04/01/2017  . Chronic diarrhea 04/01/2017  . Lumbar disc disease 03/17/2017  . Chronic painful diabetic neuropathy (HCC) 06/20/2014     Prior to Admission medications   Medication Sig Start Date End Date Taking? Authorizing Provider  acetaminophen (TYLENOL) 650 MG CR tablet Take 650 mg by mouth every 8 (eight) hours as needed for pain.   Yes [provider]  diclofenac sodium (VOLTAREN) 1 % GEL APPLY 4 GRAMS TOPICALLY TO AFFECTED AREA 4 TIMES DAILY 07/08/17  Yes [provider]  gabapentin (NEURONTIN) 600 MG tablet Take 2 tablets (1,200 mg total) by mouth 2 (two) times daily. 07/08/17 10/06/17 Yes Darreld Mclean, MD  lisinopril (PRINIVIL,ZESTRIL) 10 MG tablet Take 1 tablet (10 mg total) by mouth daily. 04/01/17  Yes Arnetha Courser, MD  lovastatin (MEVACOR) 20 MG tablet Take 1 tablet (20 mg total) by mouth daily. 05/18/17  Yes Arnetha Courser, MD  metFORMIN (GLUCOPHAGE) 1000 MG tablet Take 1 tablet (1,000 mg total) by mouth 2 (two) times daily with a meal. 09/22/17  Yes Nida, Denman George, MD  pantoprazole (PROTONIX) 40 MG tablet Take 1 tablet (40 mg total) by mouth daily. 08/17/17  Yes Danelle Berry, PA-C  vitamin B-12 (CYANOCOBALAMIN) 500 MCG tablet Take 500 mcg by mouth daily.   Yes [provider]  vitamin C (ASCORBIC ACID) 500 MG tablet Take 500 mg by mouth daily.   Yes [provider]     Allergies  Allergen Reactions  . Duloxetine Rash  . Sitagliptin Other (See Comments)    pancreatitis  . Dulaglutide Nausea Only and Nausea And Vomiting  . Hydrocodone     Edema   . Lyrica [Pregabalin]     Edema     Family History  Problem Relation Age of Onset  . Heart disease Unknown   . Arthritis Unknown   . Lung disease Unknown   . Cancer Unknown   . Kidney disease Unknown   . Diabetes Unknown      Social History   Socioeconomic History  . Marital status: Single     Spouse name: Not on file  . Number of children: Not on file  . Years of education: Not on file  . Highest education level: Not on file  Occupational History  . Not on file  Social Needs  . Financial resource strain: Not on file  . Food insecurity:    Worry: Not on file    Inability: Not on file  . Transportation needs:    Medical: Not on file    Non-medical: Not on file  Tobacco Use  . Smoking status: Never Smoker  . Smokeless tobacco: Current User    Types: Chew  . Tobacco comment: Chews 1 pack per day  Substance and Sexual Activity  . Alcohol use: Not on file  . Drug use: No  . Sexual activity: Not Currently  Lifestyle  . Physical activity:    Days per week: Not on  file    Minutes per session: Not on file  . Stress: Not on file  Relationships  . Social connections:    Talks on phone: Not on file    Gets together: Not on file    Attends religious service: Not on file    Active member of club or organization: Not on file    Attends meetings of clubs or organizations: Not on file    Relationship status: Not on file  . Intimate partner violence:    Fear of current or ex partner: Not on file    Emotionally abused: Not on file    Physically abused: Not on file    Forced sexual activity: Not on file  Other Topics Concern  . Not on file  Social History Narrative  . Not on file     Review of Systems  Constitutional: Negative.   HENT: Negative.   Eyes: Negative.   Respiratory: Negative.   Cardiovascular: Negative.   Gastrointestinal: Negative.   Endocrine: Negative.   Genitourinary: Negative.   Musculoskeletal: Negative.   Skin: Negative.   Allergic/Immunologic: Negative.   Neurological: Negative.   Hematological: Negative.   Psychiatric/Behavioral: Negative.   10 Systems reviewed and are negative for acute change except as noted in the HPI.      Objective:    Vitals:   09/23/17 0905  BP: 134/80  Pulse: 80  Resp: 14  Temp: 98.5 F (36.9 C)    TempSrc: Oral  SpO2: 97%  Weight: 182 lb (82.6 kg)  Height: 5\' 5"  (1.651 m)      Physical Exam  Constitutional: She is oriented to person, place, and time. She appears well-developed and well-nourished.  Non-toxic appearance. No distress.  Obese, chronically ill appearing AAF, appears older than stated age, no acute distress  HENT:  Head: Normocephalic and atraumatic.  Right Ear: External ear normal.  Left Ear: External ear normal.  Nose: Nose normal.  Mouth/Throat: Uvula is midline and mucous membranes are normal.  MMM  Eyes: Pupils are equal, round, and reactive to light. Conjunctivae, EOM and lids are normal. No scleral icterus.  Neck: Normal range of motion and phonation normal. Neck supple. No tracheal deviation present.  Cardiovascular: Normal rate, regular rhythm, normal heart sounds, intact distal pulses and normal pulses. PMI is not displaced. Exam reveals no gallop and no friction rub.  No murmur heard. Pulses:      Radial pulses are 2+ on the right side, and 2+ on the left side.       Posterior tibial pulses are 2+ on the right side, and 2+ on the left side.  Pulmonary/Chest: Effort normal and breath sounds normal. No stridor. No respiratory distress. She has no decreased breath sounds. She has no wheezes. She has no rhonchi. She has no rales. Chest wall is not dull to percussion. She exhibits tenderness. She exhibits no mass, no bony tenderness, no crepitus, no edema, no swelling and no retraction.  Area of max pain and mildly ttp (see picture)    Abdominal: Soft. Normal appearance and bowel sounds are normal. She exhibits no distension, no abdominal bruit, no pulsatile midline mass and no mass. There is no hepatosplenomegaly. There is no tenderness. There is no rigidity, no rebound, no guarding, no CVA tenderness, no tenderness at McBurney's point and negative Murphy's sign. No hernia.  Obese soft abdomen, no CVA tenderness  Musculoskeletal: Normal range of motion. She  exhibits no edema or deformity.  Lymphadenopathy:    She has  no cervical adenopathy.  Neurological: She is alert and oriented to person, place, and time. She exhibits normal muscle tone. Gait normal.  Skin: Skin is warm, dry and intact. Capillary refill takes less than 2 seconds. No rash noted. She is not diaphoretic. No pallor.  Psychiatric: She has a normal mood and affect. Her speech is normal and behavior is normal.  Nursing note and vitals reviewed.         Assessment & Plan:     ICD-10-CM   1. Abdominal pain, unspecified abdominal location R10.9 HYDROcodone-acetaminophen (NORCO) 7.5-325 MG tablet    pantoprazole (PROTONIX) 40 MG tablet    Pt returns with unchanged severe abdominal and right lower rib pain, and continues to be in the same location, I reviewed PPI with her, concerned it may be PUD/gastritis.  Pain is worse with eating.  Pt again urged to avoid NSAIDS.  Nothing has touched her pain except IV morphine in the ER almost two months ago.  Tramadol, PPI, avoiding NSAIDs have not helped.  Diabetes medications have been changes and labs followed which show normal lipase with last labs.  I have tried to get a CT of her chest approved to eval the right lower lung/diaphagm and RUQ better.  Gallbladder absent.  Abd plain films and CT/abd pelvis also negative in the past.  I am not sure if she is seeking narcotics?  Or if there is some other cause of pain.  EKG normal, CXR normal.  Will try norco and tylenol with increased dose of PPI, and continue to believe that specialist will help get diagnosis.  Pt has been very difficult about her appointments and specialists.  She has been established and reestablished with multiple providers this year.  Have tried to follow up with referral coordinator about appt.  Her CT is scheduled 10/06/17.  Appt, time, address and phone number printed and circled for the pt.  She is very very difficult to get ahold of by phone.     PCP's has had 3 PCP's this  year, Dr. Graciela Husbands at [facility] clinic, [facility] clinic and now here [facility] Has also be established with endocrine and nutritionist with [facility] and PCP, but left and wished to reestablish here with new PCP and endocrinologist and then stated she has never had nutrition therapy.  She was referred to physiatry for low back pain and [facility] for foot skin lesions Saw GI in April this year for multiple GI issues and colonoscopy. In March went to [facility] clinic, saw residents and attending, has continuous glucose monitoring several times, was non-compliant with that and with meds.  Has consulted with pharmacy as well. Saw neurology 04/22/17 for LE numbness eval had nerve conduction study done Pt for back pain.  Will be difficult to get a diagnosis for her, and will be difficult to successfully manage her multiple medical problems with frequent doctor changes, questionable compliance.  In reviewing all these visits she consistently has sensitivity or complications with medications and consistently is not compliant with last change to tx plan for her uncontrolled issues.    Today attempted to address other outstanding health maintenance, screenings and immunizations etc, but pt did not want to do anything, she stated she was just in too much pain.  Will consult with MD's at my clinic for follow visits with her complex med hx.     Danelle Berry, PA-C 09/23/17 9:40 AM

## 2017-09-23 NOTE — Patient Instructions (Addendum)
Avoid ibuprofen, aleve, naproxen, motrin  Take the protonix 2x a day on empty stomach  Take the pain medicine, when you take it skip the tylenol.  Follow up with GI doctor ASAP  I will call you with the CT results on Sept 18th, at 3:30, arrive 3-3:15 [facility]  Call and schedule a well visit when you are feeling better to do your wellness care, screening and update immunizations, etc.

## 2017-09-23 NOTE — Addendum Note (Signed)
Addended by: Danelle Berry on: 09/23/2017 09:38 AM   Modules accepted: Orders

## 2017-10-05 ENCOUNTER — Encounter: Payer: Self-pay | Admitting: "Endocrinology

## 2017-10-05 ENCOUNTER — Ambulatory Visit (INDEPENDENT_AMBULATORY_CARE_PROVIDER_SITE_OTHER): Payer: PRIVATE HEALTH INSURANCE | Admitting: "Endocrinology

## 2017-10-05 VITALS — BP 134/84 | HR 98 | Ht 65.0 in | Wt 177.0 lb

## 2017-10-05 DIAGNOSIS — E782 Mixed hyperlipidemia: Secondary | ICD-10-CM

## 2017-10-05 DIAGNOSIS — I10 Essential (primary) hypertension: Secondary | ICD-10-CM

## 2017-10-05 DIAGNOSIS — E1165 Type 2 diabetes mellitus with hyperglycemia: Secondary | ICD-10-CM | POA: Diagnosis not present

## 2017-10-05 MED ORDER — INSULIN DEGLUDEC 200 UNIT/ML ~~LOC~~ SOPN: 30.0000 [IU] | PEN_INJECTOR | Freq: Every day | SUBCUTANEOUS | 2 refills | Status: DC

## 2017-10-05 NOTE — Progress Notes (Signed)
Endocrinology follow-up note       10/05/2017, 2:19 PM   Subjective:    Patient ID: Destiny Hurley, female    DOB: 23-Jul-1950.  Destiny Hurley is being seen in follow-up for management of currently uncontrolled symptomatic currently uncontrolled symptomatic type 2 diabetes, hyperlipidemia, hypertension. PCP:  Danelle Berry, PA-C.   Past Medical History:  Diagnosis Date  . Adrenal mass (HCC) 2012   Donice pen on CT  . Chronic painful diabetic neuropathy (HCC)   . Depression    situational  . Diabetes mellitus   . Dysplasia of tongue   . Esophageal mass   . Heart murmur    Echo 2/09 revealing aortic sclerosis without gradient  . Hyperlipemia   . Hypertension   . Left shoulder pain    Past Surgical History:  Procedure Laterality Date  . CHOLECYSTECTOMY    . COLONOSCOPY    . COLONOSCOPY WITH PROPOFOL N/A 05/05/2017   Procedure: COLONOSCOPY WITH PROPOFOL;  Surgeon: Toledo, Boykin Nearing, MD;  Location: [facility] ENDOSCOPY;  Service: Gastroenterology;  Laterality: N/A;  . schatzki's ring     s/p dilation 2/04  . TUBAL LIGATION     Social History   Socioeconomic History  . Marital status: Single    Spouse name: Not on file  . Number of children: Not on file  . Years of education: Not on file  . Highest education level: Not on file  Occupational History  . Not on file  Social Needs  . Financial resource strain: Not on file  . Food insecurity:    Worry: Not on file    Inability: Not on file  . Transportation needs:    Medical: Not on file    Non-medical: Not on file  Tobacco Use  . Smoking status: Never Smoker  . Smokeless tobacco: Current User    Types: Chew  . Tobacco comment: Chews 1 pack per day  Substance and Sexual Activity  . Alcohol use: Not on file  . Drug use: No  . Sexual activity: Not Currently  Lifestyle  . Physical activity:    Days per week: Not on file    Minutes  per session: Not on file  . Stress: Not on file  Relationships  . Social connections:    Talks on phone: Not on file    Gets together: Not on file    Attends religious service: Not on file    Active member of club or organization: Not on file    Attends meetings of clubs or organizations: Not on file    Relationship status: Not on file  Other Topics Concern  . Not on file  Social History Narrative  . Not on file   Outpatient Encounter Medications as of 10/05/2017  Medication Sig  . acetaminophen (TYLENOL) 650 MG CR tablet Take 650 mg by mouth every 8 (eight) hours as needed for pain.  Marland Kitchen diclofenac sodium (VOLTAREN) 1 % GEL APPLY 4 GRAMS TOPICALLY TO AFFECTED AREA 4 TIMES DAILY  . gabapentin (NEURONTIN) 600 MG tablet Take 2 tablets (1,200 mg total) by mouth 2 (two) times daily.  Marland Kitchen HYDROcodone-acetaminophen (  NORCO) 7.5-325 MG tablet Take 1 tablet by mouth 3 (three) times daily as needed for severe pain.  . Insulin Degludec (TRESIBA FLEXTOUCH) 200 UNIT/ML SOPN Inject 30 Units into the skin at bedtime.  Marland Kitchen. lisinopril (PRINIVIL,ZESTRIL) 10 MG tablet Take 1 tablet (10 mg total) by mouth daily.  Marland Kitchen. lovastatin (MEVACOR) 20 MG tablet Take 1 tablet (20 mg total) by mouth daily.  . metFORMIN (GLUCOPHAGE) 1000 MG tablet Take 1 tablet (1,000 mg total) by mouth 2 (two) times daily with a meal.  . pantoprazole (PROTONIX) 40 MG tablet Take 1 tablet (40 mg total) by mouth 2 (two) times daily.  . vitamin B-12 (CYANOCOBALAMIN) 500 MCG tablet Take 500 mcg by mouth daily.  . vitamin C (ASCORBIC ACID) 500 MG tablet Take 500 mg by mouth daily.   No facility-administered encounter medications on file as of 10/05/2017.     ALLERGIES: Allergies  Allergen Reactions  . Duloxetine Rash  . Sitagliptin Other (See Comments)    pancreatitis  . Dulaglutide Nausea Only and Nausea And Vomiting  . Hydrocodone     Edema   . Lyrica [Pregabalin]     Edema    VACCINATION STATUS:  There is no immunization history  on file for this patient.  Diabetes  She presents for her follow-up diabetic visit. She has type 2 diabetes mellitus. Onset time: She was diagnosed at approximate age of 67 years. Her disease course has been worsening. There are no hypoglycemic associated symptoms. Pertinent negatives for hypoglycemia include no confusion, headaches, pallor or seizures. Associated symptoms include fatigue, polyphagia and polyuria. Pertinent negatives for diabetes include no chest pain and no polydipsia. There are no hypoglycemic complications. Symptoms are worsening. There are no diabetic complications. Risk factors for coronary artery disease include diabetes mellitus, dyslipidemia, family history, hypertension, obesity, sedentary lifestyle, post-menopausal and tobacco exposure. Current diabetic treatment includes oral agent (dual therapy). Compliance with diabetes treatment: She is currently on metformin 500 mg p.o. twice daily, Invokana 300 mg p.o. daily. Her weight is increasing steadily. She is following a generally unhealthy diet. When asked about meal planning, she reported none. She has not had a previous visit with a dietitian (Patient declined a consult.). She rarely participates in exercise. Her home blood glucose trend is increasing steadily. Her breakfast blood glucose range is generally >200 mg/dl. Her lunch blood glucose range is generally >200 mg/dl. Her dinner blood glucose range is generally >200 mg/dl. Her bedtime blood glucose range is generally >200 mg/dl. Her overall blood glucose range is >200 mg/dl. An ACE inhibitor/angiotensin II receptor blocker is being taken. Eye exam is not current.  Hyperlipidemia  This is a chronic problem. The current episode started more than 1 year ago. The problem is uncontrolled. Exacerbating diseases include diabetes and obesity. Pertinent negatives include no chest pain, myalgias or shortness of breath. Current antihyperlipidemic treatment includes statins. Risk factors for  coronary artery disease include dyslipidemia, diabetes mellitus, hypertension, a sedentary lifestyle, post-menopausal and obesity.  Hypertension  This is a chronic problem. The current episode started more than 1 year ago. The problem is controlled. Pertinent negatives include no chest pain, headaches, palpitations or shortness of breath. Risk factors for coronary artery disease include dyslipidemia, diabetes mellitus, obesity, sedentary lifestyle, smoking/tobacco exposure, family history and post-menopausal state. Past treatments include ACE inhibitors.    Review of Systems  Constitutional: Positive for fatigue. Negative for chills, fever and unexpected weight change.  HENT: Negative for trouble swallowing and voice change.   Eyes: Negative for  visual disturbance.  Respiratory: Negative for cough, shortness of breath and wheezing.   Cardiovascular: Negative for chest pain, palpitations and leg swelling.  Gastrointestinal: Negative for diarrhea, nausea and vomiting.  Endocrine: Positive for polyphagia and polyuria. Negative for cold intolerance, heat intolerance and polydipsia.  Musculoskeletal: Negative for arthralgias and myalgias.  Skin: Negative for color change, pallor, rash and wound.  Neurological: Negative for seizures and headaches.  Psychiatric/Behavioral: Negative for confusion and suicidal ideas.    Objective:    BP 134/84   Pulse 98   Ht 5\' 5"  (1.651 m)   Wt 177 lb (80.3 kg)   BMI 29.45 kg/m   Wt Readings from Last 3 Encounters:  10/05/17 177 lb (80.3 kg)  09/23/17 182 lb (82.6 kg)  09/22/17 183 lb (83 kg)     Physical Exam  Constitutional: She is oriented to person, place, and time. She appears well-developed.  HENT:  Head: Normocephalic and atraumatic.  Eyes: EOM are normal.  Neck: Normal range of motion. Neck supple. No tracheal deviation present. No thyromegaly present.  Cardiovascular: Normal rate and regular rhythm.  Pulmonary/Chest: Effort normal and breath  sounds normal.  Abdominal: Soft. Bowel sounds are normal. There is no tenderness. There is no guarding.  Musculoskeletal: Normal range of motion. She exhibits no edema.  Neurological: She is alert and oriented to person, place, and time. She has normal reflexes. No cranial nerve deficit. Coordination normal.  Skin: Skin is warm and dry. No rash noted. No erythema. No pallor.  Psychiatric: She has a normal mood and affect. Judgment normal.    CMP ( most recent) CMP     Component Value Date/Time   NA 142 08/17/2017 1053   NA 141 06/11/2017 1655   K 4.1 08/17/2017 1053   CL 107 08/17/2017 1053   CO2 27 08/17/2017 1053   GLUCOSE 110 (H) 08/17/2017 1053   BUN 15 08/17/2017 1053   BUN 16 06/11/2017 1655   CREATININE 0.86 08/17/2017 1053   CALCIUM 9.9 08/17/2017 1053   PROT 6.8 08/17/2017 1053   PROT 7.6 06/11/2017 1655   ALBUMIN 4.1 07/30/2017 2157   ALBUMIN 5.0 (H) 06/11/2017 1655   AST 12 08/17/2017 1053   ALT 11 08/17/2017 1053   ALKPHOS 105 07/30/2017 2157   BILITOT 0.3 08/17/2017 1053   BILITOT <0.2 06/11/2017 1655   GFRNONAA 71 08/17/2017 1053   GFRAA 82 08/17/2017 1053     Diabetic Labs (most recent): Lab Results  Component Value Date   HGBA1C 8.3 (H) 07/29/2017   HGBA1C 10.9 04/16/2017     Lipid Panel ( most recent) Lipid Panel     Component Value Date/Time   CHOL 185 07/29/2017 1014   TRIG 249 (H) 07/29/2017 1014   HDL 74 07/29/2017 1014   CHOLHDL 2.5 07/29/2017 1014   LDLCALC 78 07/29/2017 1014      Assessment & Plan:   1. Uncontrolled type 2 diabetes mellitus with hyperglycemia (HCC)  - Destiny Hurley has currently uncontrolled symptomatic type 2 DM since 67 years of age. -She returns with significantly above target glycemic profile, her most recent A1c was 8.3% improving from 10.9% in March 2019.  Recent labs reviewed.  -She did not report gross complications from her diabetes, however Destiny Hurley remains at a high risk for more acute  and chronic complications which include CAD, CVA, CKD, retinopathy, and neuropathy. These are all discussed in detail with the patient.  - I have counseled her on diet management and  weight loss, by adopting a carbohydrate restricted/protein rich diet.  -  Suggestion is made for her to avoid simple carbohydrates  from her diet including Cakes, Sweet Desserts / Pastries, Ice Cream, Soda (diet and regular), Sweet Tea, Candies, Chips, Cookies, Store Bought Juices, Alcohol in Excess of  1-2 drinks a day, Artificial Sweeteners, and "Sugar-free" Products. This will help patient to have stable blood glucose profile and potentially avoid unintended weight gain.  - I encouraged her to switch to  unprocessed or minimally processed complex starch and increased protein intake (animal or plant source), fruits, and vegetables.  - she is advised to stick to a routine mealtimes to eat 3 meals  a day and avoid unnecessary snacks ( to snack only to correct hypoglycemia).    - I have approached her with the following individualized plan to manage diabetes and patient agrees:   -Based on her presentation with significant hyperglycemia, she is approached for at least basal insulin. -She describes vague side effects from Lantus including swelling bilateral ankles, and wishes to avoid Lantus.    -I approached her with Tresiba 30 units nightly, associated with monitoring of blood glucose 4 times a day-before meals and at bedtime and return in 10 days with her meter and logs for reevaluation.   -Patient is encouraged to call clinic for blood glucose levels less than 70 or above 300 mg /dl. -She is advised to continue metformin 1000 mg p.o. twice daily  after breakfast and supper, therapeutically suitable for patient .  - she is not a suitable candidate for incretin therapy given recent history of pancreatitis.    - Patient specific target  A1c;  LDL, HDL, Triglycerides, and  Waist Circumference were discussed in  detail.  2) BP/HTN: Her blood pressure is controlled to target.  She is advised to continue her current blood pressure medications including lisinopril 10 mg daily.    3) Lipids/HPL:   She has hypertriglyceridemia, history of pancreatitis.  She is advised to continue lovastatin 20 mg nightly.    She will be considered for fenofibrate on subsequent visits.    4)  Weight/Diet: CDE Consult is declined by the patient,  exercise, and detailed carbohydrates information provided.  5) Chronic Care/Health Maintenance:  -she  is on ACEI/ARB and Statin medications and  is encouraged to initiate and continue to follow up with Ophthalmology, Dentist,  Podiatrist at least yearly or according to recommendations, and advised to quit tobacco products. I have recommended yearly flu vaccine and pneumonia vaccine at least every 5 years; moderate intensity exercise for up to 150 minutes weekly; and  sleep for at least 7 hours a day.  - I advised patient to maintain close follow up with Danelle Berry, PA-C for primary care needs.  - Time spent with the patient: 25 min, of which >50% was spent in reviewing her blood glucose logs , discussing her hypo- and hyper-glycemic episodes, reviewing her current and  previous labs and insulin doses and developing a plan to avoid hypo- and hyper-glycemia. Please refer to Patient Instructions for Blood Glucose Monitoring and Insulin/Medications Dosing Guide"  in media tab for additional information. Christene Slates Rosengren participated in the discussions, expressed understanding, and voiced agreement with the above plans.  All questions were answered to her satisfaction. she is encouraged to contact clinic should she have any questions or concerns prior to her return visit.   Follow up plan: - Return in about 10 days (around 10/15/2017) for Follow up with Meter and  Logs Only - no Labs.  Marquis Lunch, MD [facility] [address] [place], [place] [address] Phone: [phone]  Fax: [phone]    10/05/2017, 2:19 PM  This note was partially dictated with voice recognition software. Similar sounding words can be transcribed inadequately or may not  be corrected upon review.

## 2017-10-05 NOTE — Patient Instructions (Signed)

## 2017-10-06 ENCOUNTER — Ambulatory Visit
Admission: RE | Admit: 2017-10-06 | Discharge: 2017-10-06 | Disposition: A | Discharge status: Home / Self Care | Payer: Commercial Managed Care - HMO | Source: Ambulatory Visit | Attending: Family Medicine | Admitting: Family Medicine

## 2017-10-06 DIAGNOSIS — R0789 Other chest pain: Secondary | ICD-10-CM | POA: Diagnosis not present

## 2017-10-21 ENCOUNTER — Ambulatory Visit (INDEPENDENT_AMBULATORY_CARE_PROVIDER_SITE_OTHER): Payer: PRIVATE HEALTH INSURANCE | Admitting: "Endocrinology

## 2017-10-21 ENCOUNTER — Encounter: Payer: Self-pay | Admitting: "Endocrinology

## 2017-10-21 VITALS — BP 139/81 | HR 75 | Ht 65.0 in | Wt 182.0 lb

## 2017-10-21 DIAGNOSIS — E1165 Type 2 diabetes mellitus with hyperglycemia: Secondary | ICD-10-CM

## 2017-10-21 DIAGNOSIS — E782 Mixed hyperlipidemia: Secondary | ICD-10-CM | POA: Diagnosis not present

## 2017-10-21 DIAGNOSIS — I10 Essential (primary) hypertension: Secondary | ICD-10-CM

## 2017-10-21 NOTE — Progress Notes (Signed)
Endocrinology follow-up note       10/21/2017, 6:03 PM   Subjective:    Patient ID: Destiny Hurley, female    DOB: [birth year].  Destiny Hurley is being seen in follow-up for management of currently uncontrolled symptomatic currently uncontrolled symptomatic type 2 diabetes, hyperlipidemia, hypertension. PCP:  Danelle Berry, PA-C.   Past Medical History:  Diagnosis Date  . Adrenal mass (HCC) 2012   Diala pen on CT  . Chronic painful diabetic neuropathy (HCC)   . Depression    situational  . Diabetes mellitus   . Dysplasia of tongue   . Esophageal mass   . Heart murmur    Echo 2/09 revealing aortic sclerosis without gradient  . Hyperlipemia   . Hypertension   . Left shoulder pain    Past Surgical History:  Procedure Laterality Date  . CHOLECYSTECTOMY    . COLONOSCOPY    . COLONOSCOPY WITH PROPOFOL N/A 05/05/2017   Procedure: COLONOSCOPY WITH PROPOFOL;  Surgeon: Toledo, Boykin Nearing, MD;  Location: [facility] ENDOSCOPY;  Service: Gastroenterology;  Laterality: N/A;  . schatzki's ring     s/p dilation 2/04  . TUBAL LIGATION     Social History   Socioeconomic History  . Marital status: Single    Spouse name: Not on file  . Number of children: Not on file  . Years of education: Not on file  . Highest education level: Not on file  Occupational History  . Not on file  Social Needs  . Financial resource strain: Not on file  . Food insecurity:    Worry: Not on file    Inability: Not on file  . Transportation needs:    Medical: Not on file    Non-medical: Not on file  Tobacco Use  . Smoking status: Never Smoker  . Smokeless tobacco: Current User    Types: Chew  . Tobacco comment: Chews 1 pack per day  Substance and Sexual Activity  . Alcohol use: Not on file  . Drug use: No  . Sexual activity: Not Currently  Lifestyle  . Physical activity:    Days per week: Not on file    Minutes  per session: Not on file  . Stress: Not on file  Relationships  . Social connections:    Talks on phone: Not on file    Gets together: Not on file    Attends religious service: Not on file    Active member of club or organization: Not on file    Attends meetings of clubs or organizations: Not on file    Relationship status: Not on file  Other Topics Concern  . Not on file  Social History Narrative  . Not on file   Outpatient Encounter Medications as of 10/21/2017  Medication Sig  . acetaminophen (TYLENOL) 650 MG CR tablet Take 650 mg by mouth every 8 (eight) hours as needed for pain.  Marland Kitchen diclofenac sodium (VOLTAREN) 1 % GEL APPLY 4 GRAMS TOPICALLY TO AFFECTED AREA 4 TIMES DAILY  . gabapentin (NEURONTIN) 600 MG tablet Take 2 tablets (1,200 mg total) by mouth 2 (two) times daily.  Marland Kitchen HYDROcodone-acetaminophen (  NORCO) 7.5-325 MG tablet Take 1 tablet by mouth 3 (three) times daily as needed for severe pain.  . Insulin Degludec (TRESIBA FLEXTOUCH) 200 UNIT/ML SOPN Inject 30 Units into the skin at bedtime.  Marland Kitchen lisinopril (PRINIVIL,ZESTRIL) 10 MG tablet Take 1 tablet (10 mg total) by mouth daily.  Marland Kitchen lovastatin (MEVACOR) 20 MG tablet Take 1 tablet (20 mg total) by mouth daily.  . metFORMIN (GLUCOPHAGE) 1000 MG tablet Take 1 tablet (1,000 mg total) by mouth 2 (two) times daily with a meal.  . pantoprazole (PROTONIX) 40 MG tablet Take 1 tablet (40 mg total) by mouth 2 (two) times daily.  . vitamin B-12 (CYANOCOBALAMIN) 500 MCG tablet Take 500 mcg by mouth daily.  . vitamin C (ASCORBIC ACID) 500 MG tablet Take 500 mg by mouth daily.   No facility-administered encounter medications on file as of 10/21/2017.     ALLERGIES: Allergies  Allergen Reactions  . Duloxetine Rash  . Sitagliptin Other (See Comments)    pancreatitis  . Dulaglutide Nausea Only and Nausea And Vomiting  . Hydrocodone     Edema   . Lyrica [Pregabalin]     Edema    VACCINATION STATUS:  There is no immunization history  on file for this patient.  Diabetes  She presents for her follow-up diabetic visit. She has type 2 diabetes mellitus. Onset time: She was diagnosed at approximate age of 61 years. Her disease course has been improving. There are no hypoglycemic associated symptoms. Pertinent negatives for hypoglycemia include no confusion, headaches, pallor or seizures. Pertinent negatives for diabetes include no chest pain, no fatigue, no polydipsia and no polyphagia. There are no hypoglycemic complications. Symptoms are improving. There are no diabetic complications. Risk factors for coronary artery disease include diabetes mellitus, dyslipidemia, family history, hypertension, obesity, sedentary lifestyle, post-menopausal and tobacco exposure. Current diabetic treatment includes oral agent (dual therapy). Compliance with diabetes treatment: She is currently on metformin 500 mg p.o. twice daily, Invokana 300 mg p.o. daily. Her weight is increasing steadily. She is following a generally unhealthy diet. When asked about meal planning, she reported none. She has not had a previous visit with a dietitian (Patient declined a consult.). She rarely participates in exercise. Her home blood glucose trend is increasing steadily. Her breakfast blood glucose range is generally 140-180 mg/dl. Her lunch blood glucose range is generally 140-180 mg/dl. Her dinner blood glucose range is generally 140-180 mg/dl. Her bedtime blood glucose range is generally 140-180 mg/dl. Her overall blood glucose range is 140-180 mg/dl. An ACE inhibitor/angiotensin II receptor blocker is being taken. Eye exam is not current.  Hyperlipidemia  This is a chronic problem. The current episode started more than 1 year ago. The problem is uncontrolled. Exacerbating diseases include diabetes and obesity. Pertinent negatives include no chest pain, myalgias or shortness of breath. Current antihyperlipidemic treatment includes statins. Risk factors for coronary artery  disease include dyslipidemia, diabetes mellitus, hypertension, a sedentary lifestyle, post-menopausal and obesity.  Hypertension  This is a chronic problem. The current episode started more than 1 year ago. The problem is controlled. Pertinent negatives include no chest pain, headaches, palpitations or shortness of breath. Risk factors for coronary artery disease include dyslipidemia, diabetes mellitus, obesity, sedentary lifestyle, smoking/tobacco exposure, family history and post-menopausal state. Past treatments include ACE inhibitors.    Review of Systems  Constitutional: Negative for chills, fatigue, fever and unexpected weight change.  HENT: Negative for trouble swallowing and voice change.   Eyes: Negative for visual disturbance.  Respiratory: Negative  for cough, shortness of breath and wheezing.   Cardiovascular: Negative for chest pain, palpitations and leg swelling.  Gastrointestinal: Negative for diarrhea, nausea and vomiting.  Endocrine: Negative for cold intolerance, heat intolerance, polydipsia and polyphagia.  Musculoskeletal: Negative for arthralgias and myalgias.  Skin: Negative for color change, pallor, rash and wound.  Neurological: Negative for seizures and headaches.  Psychiatric/Behavioral: Negative for confusion and suicidal ideas.    Objective:    BP 139/81   Pulse 75   Ht 5\' 5"  (1.651 m)   Wt 182 lb (82.6 kg)   BMI 30.29 kg/m   Wt Readings from Last 3 Encounters:  10/21/17 182 lb (82.6 kg)  10/05/17 177 lb (80.3 kg)  09/23/17 182 lb (82.6 kg)     Physical Exam  Constitutional: She is oriented to person, place, and time. She appears well-developed.  HENT:  Head: Normocephalic and atraumatic.  Eyes: EOM are normal.  Neck: Normal range of motion. Neck supple. No tracheal deviation present. No thyromegaly present.  Cardiovascular: Normal rate and regular rhythm.  Pulmonary/Chest: Effort normal and breath sounds normal.  Abdominal: Soft. Bowel sounds are  normal. There is no tenderness. There is no guarding.  Musculoskeletal: Normal range of motion. She exhibits no edema.  Neurological: She is alert and oriented to person, place, and time. She has normal reflexes. No cranial nerve deficit. Coordination normal.  Skin: Skin is warm and dry. No rash noted. No erythema. No pallor.  Psychiatric: She has a normal mood and affect. Judgment normal.    CMP ( most recent) CMP     Component Value Date/Time   NA 142 08/17/2017 1053   NA 141 06/11/2017 1655   K 4.1 08/17/2017 1053   CL 107 08/17/2017 1053   CO2 27 08/17/2017 1053   GLUCOSE 110 (H) 08/17/2017 1053   BUN 15 08/17/2017 1053   BUN 16 06/11/2017 1655   CREATININE 0.86 08/17/2017 1053   CALCIUM 9.9 08/17/2017 1053   PROT 6.8 08/17/2017 1053   PROT 7.6 06/11/2017 1655   ALBUMIN 4.1 07/30/2017 2157   ALBUMIN 5.0 (H) 06/11/2017 1655   AST 12 08/17/2017 1053   ALT 11 08/17/2017 1053   ALKPHOS 105 07/30/2017 2157   BILITOT 0.3 08/17/2017 1053   BILITOT <0.2 06/11/2017 1655   GFRNONAA 71 08/17/2017 1053   GFRAA 82 08/17/2017 1053     Diabetic Labs (most recent): Lab Results  Component Value Date   HGBA1C 8.3 (H) 07/29/2017   HGBA1C 10.9 04/16/2017     Lipid Panel ( most recent) Lipid Panel     Component Value Date/Time   CHOL 185 07/29/2017 1014   TRIG 249 (H) 07/29/2017 1014   HDL 74 07/29/2017 1014   CHOLHDL 2.5 07/29/2017 1014   LDLCALC 78 07/29/2017 1014      Assessment & Plan:   1. Uncontrolled type 2 diabetes mellitus with hyperglycemia (HCC)  - Narmeen Kerper Dunigan has currently uncontrolled symptomatic type 2 DM since 67 years of age. -She returns with significantly proved glycemic profile on basal insulin.  -Her recent labs was consistent with A1c of 8.3%.   Recent labs reviewed.  -She did not report gross complications from her diabetes, however Mulan Adan Luebke remains at a high risk for more acute and chronic complications which include CAD, CVA,  CKD, retinopathy, and neuropathy. These are all discussed in detail with the patient.  - I have counseled her on diet management and weight loss, by adopting a carbohydrate restricted/protein rich  diet.  -She still admits to dietary discretion including consumption of sweetened beverages.  -  Suggestion is made for her to avoid simple carbohydrates  from her diet including Cakes, Sweet Desserts / Pastries, Ice Cream, Soda (diet and regular), Sweet Tea, Candies, Chips, Cookies, Store Bought Juices, Alcohol in Excess of  1-2 drinks a day, Artificial Sweeteners, and "Sugar-free" Products. This will help patient to have stable blood glucose profile and potentially avoid unintended weight gain.   - I encouraged her to switch to  unprocessed or minimally processed complex starch and increased protein intake (animal or plant source), fruits, and vegetables.  - she is advised to stick to a routine mealtimes to eat 3 meals  a day and avoid unnecessary snacks ( to snack only to correct hypoglycemia).    - I have approached her with the following individualized plan to manage diabetes and patient agrees:   -She has responded to basal insulin with controlled fasting glycemic profile and near target postprandial glycemic profile.   -She has tolerated [place].  She is advised to continue Tresiba 30 units nightly, associated with monitoring of blood glucose 2 times a day-before breakfast and at bedtime.  -Patient is encouraged to call clinic for blood glucose levels less than 70 or above 300 mg /dl. -She is advised to continue metformin 1000 mg p.o. twice daily  after breakfast and supper, therapeutically suitable for patient .  - she is not a suitable candidate for incretin therapy given recent history of pancreatitis.    - Patient specific target  A1c;  LDL, HDL, Triglycerides, and  Waist Circumference were discussed in detail.  2) BP/HTN: Her blood pressure is controlled to target.  She is advised to  continue her current blood pressure medications including lisinopril 10 mg daily.    3) Lipids/HPL:   She has hypertriglyceridemia, history of pancreatitis.  She is advised to continue lovastatin 20 mg nightly.   She will be considered for fenofibrate on subsequent visits.    4)  Weight/Diet: CDE Consult is declined by the patient,  exercise, and detailed carbohydrates information provided.  5) Chronic Care/Health Maintenance:  -she  is on ACEI/ARB and Statin medications and  is encouraged to initiate and continue to follow up with Ophthalmology, Dentist,  Podiatrist at least yearly or according to recommendations, and advised to quit tobacco products. I have recommended yearly flu vaccine and pneumonia vaccine at least every 5 years; moderate intensity exercise for up to 150 minutes weekly; and  sleep for at least 7 hours a day.  - I advised patient to maintain close follow up with Danelle Berry, PA-C for primary care needs.  - Time spent with the patient: 25 min, of which >50% was spent in reviewing her blood glucose logs , discussing her hypo- and hyper-glycemic episodes, reviewing her current and  previous labs and insulin doses and developing a plan to avoid hypo- and hyper-glycemia. Please refer to Patient Instructions for Blood Glucose Monitoring and Insulin/Medications Dosing Guide"  in media tab for additional information. Christene Slates Weichel participated in the discussions, expressed understanding, and voiced agreement with the above plans.  All questions were answered to her satisfaction. she is encouraged to contact clinic should she have any questions or concerns prior to her return visit.   Follow up plan: - Return in about 5 weeks (around 11/25/2017) for Follow up with Pre-visit Labs, Meter, and Logs.  Marquis Lunch, MD [facility] [address]  [place], [place] [address] Phone: [phone]  Fax: [phone]    10/21/2017,  6:03 PM  This note was partially dictated with voice recognition software. Similar sounding words can be transcribed inadequately or may not  be corrected upon review.

## 2017-10-21 NOTE — Patient Instructions (Signed)

## 2017-10-26 DIAGNOSIS — K228 Other specified diseases of esophagus: Secondary | ICD-10-CM | POA: Diagnosis not present

## 2017-10-26 DIAGNOSIS — K219 Gastro-esophageal reflux disease without esophagitis: Secondary | ICD-10-CM | POA: Diagnosis not present

## 2017-10-26 DIAGNOSIS — R1013 Epigastric pain: Secondary | ICD-10-CM | POA: Diagnosis not present

## 2017-10-26 DIAGNOSIS — K2930 Chronic superficial gastritis without bleeding: Secondary | ICD-10-CM | POA: Diagnosis not present

## 2017-11-01 DIAGNOSIS — K228 Other specified diseases of esophagus: Secondary | ICD-10-CM | POA: Diagnosis not present

## 2017-11-01 DIAGNOSIS — K219 Gastro-esophageal reflux disease without esophagitis: Secondary | ICD-10-CM | POA: Diagnosis not present

## 2017-11-01 DIAGNOSIS — K2930 Chronic superficial gastritis without bleeding: Secondary | ICD-10-CM | POA: Diagnosis not present

## 2017-11-12 ENCOUNTER — Other Ambulatory Visit: Payer: Self-pay | Admitting: Gastroenterology

## 2017-11-19 DIAGNOSIS — R5382 Chronic fatigue, unspecified: Secondary | ICD-10-CM | POA: Diagnosis not present

## 2017-11-19 DIAGNOSIS — E1165 Type 2 diabetes mellitus with hyperglycemia: Secondary | ICD-10-CM | POA: Diagnosis not present

## 2017-11-19 DIAGNOSIS — E782 Mixed hyperlipidemia: Secondary | ICD-10-CM | POA: Diagnosis not present

## 2017-11-19 DIAGNOSIS — D519 Vitamin B12 deficiency anemia, unspecified: Secondary | ICD-10-CM | POA: Diagnosis not present

## 2017-11-19 DIAGNOSIS — E559 Vitamin D deficiency, unspecified: Secondary | ICD-10-CM | POA: Diagnosis not present

## 2017-11-20 LAB — VITAMIN D 25 HYDROXY (VIT D DEFICIENCY, FRACTURES): Vit D, 25-Hydroxy: 14 ng/mL — ABNORMAL LOW (ref 30–100)

## 2017-11-20 LAB — COMPLETE METABOLIC PANEL WITH GFR
AG Ratio: 1.9 (calc) (ref 1.0–2.5)
ALBUMIN MSPROF: 4.4 g/dL (ref 3.6–5.1)
ALKALINE PHOSPHATASE (APISO): 91 U/L (ref 33–130)
ALT: 10 U/L (ref 6–29)
AST: 11 U/L (ref 10–35)
BUN: 19 mg/dL (ref 7–25)
CHLORIDE: 104 mmol/L (ref 98–110)
CO2: 27 mmol/L (ref 20–32)
Calcium: 10 mg/dL (ref 8.6–10.4)
Creat: 0.79 mg/dL (ref 0.50–0.99)
GFR, Est African American: 91 mL/min/{1.73_m2} (ref >=60)
GFR, Est Non African American: 79 mL/min/{1.73_m2} (ref >=60)
GLOBULIN: 2.3 g/dL (ref 1.9–3.7)
GLUCOSE: 154 mg/dL — AB (ref 65–99)
Potassium: 4 mmol/L (ref 3.5–5.3)
SODIUM: 141 mmol/L (ref 135–146)
Total Bilirubin: 0.3 mg/dL (ref 0.2–1.2)
Total Protein: 6.7 g/dL (ref 6.1–8.1)

## 2017-11-20 LAB — MICROALBUMIN / CREATININE URINE RATIO
CREATININE, URINE: 121 mg/dL (ref 20–275)
Microalb Creat Ratio: 437 mcg/mg creat — ABNORMAL HIGH (ref <30)
Microalb, Ur: 52.9 mg/dL

## 2017-11-20 LAB — T4, FREE: FREE T4: 1.2 ng/dL (ref 0.8–1.8)

## 2017-11-20 LAB — HEMOGLOBIN A1C
EAG (MMOL/L): 10.9 (calc)
Hgb A1c MFr Bld: 8.5 % of total Hgb — ABNORMAL HIGH (ref <5.7)
MEAN PLASMA GLUCOSE: 197 (calc)

## 2017-11-20 LAB — LIPID PANEL
CHOLESTEROL: 148 mg/dL (ref <200)
HDL: 66 mg/dL (ref >50)
LDL Cholesterol (Calc): 61 mg/dL (calc)
Non-HDL Cholesterol (Calc): 82 mg/dL (calc) (ref <130)
Total CHOL/HDL Ratio: 2.2 (calc) (ref <5.0)
Triglycerides: 130 mg/dL (ref <150)

## 2017-11-20 LAB — TSH: TSH: 1.46 mIU/L (ref 0.40–4.50)

## 2017-11-20 LAB — VITAMIN B12: Vitamin B-12: 503 pg/mL (ref 200–1100)

## 2017-11-24 ENCOUNTER — Other Ambulatory Visit: Payer: Self-pay

## 2017-11-24 ENCOUNTER — Encounter: Payer: Self-pay | Admitting: Family Medicine

## 2017-11-24 ENCOUNTER — Ambulatory Visit (INDEPENDENT_AMBULATORY_CARE_PROVIDER_SITE_OTHER): Payer: PRIVATE HEALTH INSURANCE | Admitting: Family Medicine

## 2017-11-24 VITALS — BP 134/70 | HR 82 | Temp 98.9°F | Resp 16 | Ht 65.0 in | Wt 182.0 lb

## 2017-11-24 DIAGNOSIS — I10 Essential (primary) hypertension: Secondary | ICD-10-CM | POA: Diagnosis not present

## 2017-11-24 DIAGNOSIS — R109 Unspecified abdominal pain: Secondary | ICD-10-CM

## 2017-11-24 DIAGNOSIS — E782 Mixed hyperlipidemia: Secondary | ICD-10-CM | POA: Diagnosis not present

## 2017-11-24 DIAGNOSIS — R140 Abdominal distension (gaseous): Secondary | ICD-10-CM | POA: Diagnosis not present

## 2017-11-24 DIAGNOSIS — E118 Type 2 diabetes mellitus with unspecified complications: Secondary | ICD-10-CM

## 2017-11-24 DIAGNOSIS — G8929 Other chronic pain: Secondary | ICD-10-CM | POA: Diagnosis not present

## 2017-11-24 NOTE — Assessment & Plan Note (Signed)
Labs recently repeated by endocrine, well controlled, pt tolerating meds, no SE F/up 4 months

## 2017-11-24 NOTE — Progress Notes (Signed)
Patient ID: Destiny Hurley, female    DOB: October 09, 1950, 67 y.o.   MRN: [id number]  PCP: Danelle Berry, PA-C  Chief Complaint  Patient presents with  . ABD Pain    states that she has gas pain and Tums makes it worse- states that she has been seen by GI and was told that it has to work it's way out- has not tried any OTC meds    Subjective:   Destiny Hurley is a 67 y.o. female, presents to clinic with CC of abdominal pain that is chronic, she has been established with GI 2 times this year, I have reviewed [facility] records where she was seen 09/01/2017 and had a upper endoscopy October 26, 2017, also has follow-up in roughly 3 to 4 weeks, signs and symptoms of gastritis and esophagitis is on Protonix 40 mg, she continues to state her gas pains and Tums make abdominal pain worse.  She has been seeing her endocrinologist who recently did labs 5 days ago they show vitamin D deficiency, A1c at 8.5, thyroid was normal.  Hypertension is well controlled, no side effects of lisinopril medication. She is taking cholesterol medicine - doesn't notice any SE - her endocrinologist just checked FLP 5 days ago, they are improved and well controlled, total cholesterol is 148, HDL 66, 61, triglycerides 102. Vitamin D is low at 14, she does have follow-up with endocrinology tomorrow but not sure if they are going to discuss this I have not received a message from Dr. Fransico Him.  Will follow and review their office visit notes when they are completed  Patient's main complaint is a lot of bloating and gas pains she states that she did ask the gastroenterologist about this and the nurse told her that she does have asked to "blow it out."    Review of Systems  Constitutional: Negative.  Negative for activity change, appetite change, chills, diaphoresis, fatigue, fever and unexpected weight change.  HENT: Negative.   Eyes: Negative.   Respiratory: Negative.   Cardiovascular: Negative.   Gastrointestinal:  Positive for abdominal pain (chronic, unchanged).  Endocrine: Negative.   Genitourinary: Negative.   Musculoskeletal: Negative.   Skin: Negative.   Allergic/Immunologic: Negative.   Neurological: Negative.   Hematological: Negative.   Psychiatric/Behavioral: Negative.   All other systems reviewed and are negative.    Current Meds  Medication Sig  . acetaminophen (TYLENOL) 650 MG CR tablet Take 650 mg by mouth every 8 (eight) hours as needed for pain.  Marland Kitchen diclofenac sodium (VOLTAREN) 1 % GEL APPLY 4 GRAMS TOPICALLY TO AFFECTED AREA 4 TIMES DAILY  . gabapentin (NEURONTIN) 600 MG tablet Take 2 tablets (1,200 mg total) by mouth 2 (two) times daily.  Marland Kitchen HYDROcodone-acetaminophen (NORCO) 7.5-325 MG tablet Take 1 tablet by mouth 3 (three) times daily as needed for severe pain.  . Insulin Degludec (TRESIBA FLEXTOUCH) 200 UNIT/ML SOPN Inject 30 Units into the skin at bedtime.  Marland Kitchen lisinopril (PRINIVIL,ZESTRIL) 10 MG tablet Take 1 tablet (10 mg total) by mouth daily.  Marland Kitchen lovastatin (MEVACOR) 20 MG tablet Take 1 tablet (20 mg total) by mouth daily.  . metFORMIN (GLUCOPHAGE) 1000 MG tablet Take 1 tablet (1,000 mg total) by mouth 2 (two) times daily with a meal.  . pantoprazole (PROTONIX) 40 MG tablet Take 1 tablet (40 mg total) by mouth 2 (two) times daily.  . vitamin B-12 (CYANOCOBALAMIN) 500 MCG tablet Take 500 mcg by mouth daily.  . vitamin C (ASCORBIC ACID) 500  MG tablet Take 500 mg by mouth daily.         Objective:    Vitals:   11/24/17 1534  BP: 134/70  Pulse: 82  Resp: 16  Temp: 98.9 F (37.2 C)  TempSrc: Oral  SpO2: 98%  Weight: 182 lb (82.6 kg)  Height: 5\' 5"  (1.651 m)      Physical Exam  Constitutional: She appears well-developed and well-nourished. No distress.  HENT:  Head: Normocephalic and atraumatic.  Nose: Nose normal.  Eyes: Conjunctivae are normal. Right eye exhibits no discharge. Left eye exhibits no discharge.  Neck: No tracheal deviation present.    Cardiovascular: Normal rate, regular rhythm, normal heart sounds and intact distal pulses. Exam reveals no gallop and no friction rub.  No murmur heard. Pulmonary/Chest: Effort normal and breath sounds normal. No stridor. No respiratory distress.  Abdominal: Soft. Bowel sounds are normal. She exhibits no mass. There is no rigidity, no rebound and no guarding.  Mild abd bloating, no distension  Musculoskeletal: Normal range of motion.  Neurological: She is alert. She exhibits normal muscle tone. Coordination normal.  Skin: Skin is warm and dry. No rash noted. She is not diaphoretic.  Psychiatric: She has a normal mood and affect. Her behavior is normal.  Nursing note and vitals reviewed.         Assessment & Plan:   Patient is a 67 year old female presents with chronic abdominal pain and bloating she is seeing a specialist, symptoms are unchanged despite EGD medication management by GI, she has upcoming appointment with GI and with endocrinology in the next couple weeks.  Advised her to contact her specialist with her new symptoms, can try over-the-counter anti-gas medicines, she is not due for any routine follow-up of her other conditions.  She does just seem fairly miserable with chronic pain however all treatments of medications have been entirely ineffective so I will not continue to try and prescribe any pain medicine for this or intervene with specialist management as I discussed with her, its inappropriate for me to do so at this point.   Problem List Items Addressed This Visit      Cardiovascular and Mediastinum   Essential hypertension, benign    Well-controlled, continue current medications, labs not due for several months, return in 4 months for recheck and BMP        Other   Mixed hyperlipidemia    Labs recently repeated by endocrine, well controlled, pt tolerating meds, no SE F/up 4 months       Other Visit Diagnoses    Chronic abdominal pain    -  Primary   Seeing  GI - dx with gastritis, protonix 40 mg daily, has f/up, advised to contact GI for sx   Type 2 diabetes mellitus with complication, without long-term current use of insulin (HCC)       Abdominal bloating       try OTC anti-gas meds like simethicone, call GI and can f/up with endocrine to ask about as well          Danelle Berry, PA-C 11/24/17 3:47 PM

## 2017-11-24 NOTE — Patient Instructions (Signed)
For your abdominal pain you need to go to your gastroenterologist I cannot intervene with any of his current management, and it would be inappropriate for me to prescribe the pain medicine.  You do have follow-up with him in a few weeks.  I would suggest calling his office and requesting an earlier appointment to see if this is something he can do for you.  For your diabetes are currently seeing a specialist as well and most recent labs have resulted you will need to contact them regarding those.  As her PCP it would be managing her blood pressure and other routine issues such as mammogram screening, annual well exams, other acute illnesses.  Currently do not need any lab work to check on your chronic conditions.  Your blood pressure is well controlled.

## 2017-11-24 NOTE — Assessment & Plan Note (Signed)
Well-controlled, continue current medications, labs not due for several months, return in 4 months for recheck and BMP

## 2017-11-25 ENCOUNTER — Ambulatory Visit (INDEPENDENT_AMBULATORY_CARE_PROVIDER_SITE_OTHER): Payer: PRIVATE HEALTH INSURANCE | Admitting: "Endocrinology

## 2017-11-25 ENCOUNTER — Encounter: Payer: Self-pay | Admitting: "Endocrinology

## 2017-11-25 VITALS — BP 139/70 | HR 68 | Ht 65.0 in | Wt 184.0 lb

## 2017-11-25 DIAGNOSIS — I10 Essential (primary) hypertension: Secondary | ICD-10-CM

## 2017-11-25 DIAGNOSIS — E782 Mixed hyperlipidemia: Secondary | ICD-10-CM | POA: Diagnosis not present

## 2017-11-25 DIAGNOSIS — E1165 Type 2 diabetes mellitus with hyperglycemia: Secondary | ICD-10-CM

## 2017-11-25 DIAGNOSIS — E559 Vitamin D deficiency, unspecified: Secondary | ICD-10-CM | POA: Diagnosis not present

## 2017-11-25 MED ORDER — VITAMIN D3 125 MCG (5000 UT) PO CAPS: 5000.0000 [IU] | ORAL_CAPSULE | Freq: Every day | ORAL | 0 refills | Status: AC

## 2017-11-25 NOTE — Progress Notes (Signed)
Endocrinology follow-up note       11/25/2017, 4:53 PM   Subjective:    Patient ID: Destiny Hurley, female    DOB: Dec 26, 1950.  Destiny Hurley is being seen in follow-up for management of currently uncontrolled symptomatic currently uncontrolled symptomatic type 2 diabetes, hyperlipidemia, hypertension. PCP:  Danelle Berry, PA-C.   Past Medical History:  Diagnosis Date  . Adrenal mass (HCC) 2012   Delanna pen on CT  . Chronic painful diabetic neuropathy (HCC)   . Depression    situational  . Diabetes mellitus   . Dysplasia of tongue   . Esophageal mass   . Heart murmur    Echo 2/09 revealing aortic sclerosis without gradient  . Hyperlipemia   . Hypertension   . Left shoulder pain   . Pancreatitis 07/30/2017   Lipase critically elevated, greater than 3x normal   Past Surgical History:  Procedure Laterality Date  . CHOLECYSTECTOMY    . COLONOSCOPY    . COLONOSCOPY WITH PROPOFOL N/A 05/05/2017   Procedure: COLONOSCOPY WITH PROPOFOL;  Surgeon: Toledo, Boykin Nearing, MD;  Location: [facility] END[facility]COPY;  Service: Gastroenterology;  Laterality: N/A;  . schatzki's ring     s/p dilation 2/04  . TUBAL LIGATION     Social History   Socioeconomic History  . Marital status: Single    Spouse name: Not on file  . Number of children: Not on file  . Years of education: Not on file  . Highest education level: Not on file  Occupational History  . Not on file  Social Needs  . Financial resource strain: Not on file  . Food insecurity:    Worry: Not on file    Inability: Not on file  . Transportation needs:    Medical: Not on file    Non-medical: Not on file  Tobacco Use  . Smoking status: Never Smoker  . Smokeless tobacco: Current User    Types: Chew  . Tobacco comment: Chews 1 pack per day  Substance and Sexual Activity  . Alcohol use: Not on file  . Drug use: No  . Sexual activity: Not  Currently  Lifestyle  . Physical activity:    Days per week: Not on file    Minutes per session: Not on file  . Stress: Not on file  Relationships  . Social connections:    Talks on phone: Not on file    Gets together: Not on file    Attends religious service: Not on file    Active member of club or organization: Not on file    Attends meetings of clubs or organizations: Not on file    Relationship status: Not on file  Other Topics Concern  . Not on file  Social History Narrative  . Not on file   Outpatient Encounter Medications as of 11/25/2017  Medication Sig  . acetaminophen (TYLENOL) 650 MG CR tablet Take 650 mg by mouth every 8 (eight) hours as needed for pain.  . Cholecalciferol (VITAMIN D3) 125 MCG (5000 UT) CAPS Take 1 capsule (5,000 Units total) by mouth daily.  Marland Kitchen gabapentin (NEURONTIN) 600 MG tablet Take  2 tablets (1,200 mg total) by mouth 2 (two) times daily.  Marland Kitchen HYDROcodone-acetaminophen (NORCO) 7.5-325 MG tablet Take 1 tablet by mouth 3 (three) times daily as needed for severe pain.  . Insulin Degludec (TRESIBA FLEXTOUCH) 200 UNIT/ML SOPN Inject 30 Units into the skin at bedtime.  Marland Kitchen lisinopril (PRINIVIL,ZESTRIL) 10 MG tablet Take 1 tablet (10 mg total) by mouth daily.  Marland Kitchen lovastatin (MEVACOR) 20 MG tablet Take 1 tablet (20 mg total) by mouth daily.  . metFORMIN (GLUCOPHAGE) 1000 MG tablet Take 1 tablet (1,000 mg total) by mouth 2 (two) times daily with a meal.  . pantoprazole (PROTONIX) 40 MG tablet Take 1 tablet (40 mg total) by mouth 2 (two) times daily.  . vitamin B-12 (CYANOCOBALAMIN) 500 MCG tablet Take 500 mcg by mouth daily.  . vitamin C (ASCORBIC ACID) 500 MG tablet Take 500 mg by mouth daily.  . [DISCONTINUED] diclofenac sodium (VOLTAREN) 1 % GEL APPLY 4 GRAMS TOPICALLY TO AFFECTED AREA 4 TIMES DAILY   No facility-administered encounter medications on file as of 11/25/2017.     ALLERGIES: Allergies  Allergen Reactions  . Duloxetine Rash  . Sitagliptin Other  (See Comments)    pancreatitis  . Dulaglutide Nausea Only and Nausea And Vomiting  . Hydrocodone     Edema   . Lyrica [Pregabalin]     Edema    VACCINATION STATUS:  There is no immunization history on file for this patient.  Diabetes  She presents for her follow-up diabetic visit. She has type 2 diabetes mellitus. Onset time: She was diagnosed at approximate age of 67 years. Her disease course has been stable. There are no hypoglycemic associated symptoms. Pertinent negatives for hypoglycemia include no confusion, headaches, pallor or seizures. Pertinent negatives for diabetes include no chest pain, no fatigue, no polydipsia and no polyphagia. There are no hypoglycemic complications. Symptoms are stable. There are no diabetic complications. Risk factors for coronary artery disease include diabetes mellitus, dyslipidemia, family history, hypertension, obesity, sedentary lifestyle, post-menopausal and tobacco exposure. Current diabetic treatment includes oral agent (dual therapy). Compliance with diabetes treatment: She is currently on metformin 500 mg p.o. twice daily, Invokana 300 mg p.o. daily. Her weight is fluctuating minimally. She is following a generally unhealthy diet. When asked about meal planning, she reported none. She has not had a previous visit with a dietitian (Patient declined a consult.). She rarely participates in exercise. Her home blood glucose trend is increasing steadily. Her breakfast blood glucose range is generally 140-180 mg/dl. Her bedtime blood glucose range is generally 180-200 mg/dl. Her overall blood glucose range is 180-200 mg/dl. An ACE inhibitor/angiotensin II receptor blocker is being taken. Eye exam is not current.  Hyperlipidemia  This is a chronic problem. The current episode started more than 1 year ago. The problem is uncontrolled. Exacerbating diseases include diabetes and obesity. Pertinent negatives include no chest pain, myalgias or shortness of breath.  Current antihyperlipidemic treatment includes statins. Risk factors for coronary artery disease include dyslipidemia, diabetes mellitus, hypertension, a sedentary lifestyle, post-menopausal and obesity.  Hypertension  This is a chronic problem. The current episode started more than 1 year ago. The problem is controlled. Pertinent negatives include no chest pain, headaches, palpitations or shortness of breath. Risk factors for coronary artery disease include dyslipidemia, diabetes mellitus, obesity, sedentary lifestyle, smoking/tobacco exposure, family history and post-menopausal state. Past treatments include ACE inhibitors.    Review of Systems  Constitutional: Negative for chills, fatigue, fever and unexpected weight change.  HENT: Negative for  trouble swallowing and voice change.   Eyes: Negative for visual disturbance.  Respiratory: Negative for cough, shortness of breath and wheezing.   Cardiovascular: Negative for chest pain, palpitations and leg swelling.  Gastrointestinal: Negative for diarrhea, nausea and vomiting.  Endocrine: Negative for cold intolerance, heat intolerance, polydipsia and polyphagia.  Musculoskeletal: Negative for arthralgias and myalgias.  Skin: Negative for color change, pallor, rash and wound.  Neurological: Negative for seizures and headaches.  Psychiatric/Behavioral: Negative for confusion and suicidal ideas.    Objective:    BP 139/70   Pulse 68   Ht 5\' 5"  (1.651 m)   Wt 184 lb (83.5 kg)   BMI 30.62 kg/m   Wt Readings from Last 3 Encounters:  11/25/17 184 lb (83.5 kg)  11/24/17 182 lb (82.6 kg)  10/21/17 182 lb (82.6 kg)     Physical Exam  Constitutional: She is oriented to person, place, and time. She appears well-developed.  HENT:  Head: Normocephalic and atraumatic.  Eyes: EOM are normal.  Neck: Normal range of motion. Neck supple. No tracheal deviation present. No thyromegaly present.  Cardiovascular: Normal rate and regular rhythm.   Pulmonary/Chest: Effort normal and breath sounds normal.  Abdominal: Soft. Bowel sounds are normal. There is no tenderness. There is no guarding.  Musculoskeletal: Normal range of motion. She exhibits no edema.  Neurological: She is alert and oriented to person, place, and time. She has normal reflexes. No cranial nerve deficit. Coordination normal.  Skin: Skin is warm and dry. No rash noted. No erythema. No pallor.  Psychiatric: She has a normal mood and affect. Judgment normal.    CMP ( most recent) CMP     Component Value Date/Time   NA 141 11/19/2017 1009   NA 141 06/11/2017 1655   K 4.0 11/19/2017 1009   CL 104 11/19/2017 1009   CO2 27 11/19/2017 1009   GLUCOSE 154 (H) 11/19/2017 1009   BUN 19 11/19/2017 1009   BUN 16 06/11/2017 1655   CREATININE 0.79 11/19/2017 1009   CALCIUM 10.0 11/19/2017 1009   PROT 6.7 11/19/2017 1009   PROT 7.6 06/11/2017 1655   ALBUMIN 4.1 07/30/2017 2157   ALBUMIN 5.0 (H) 06/11/2017 1655   AST 11 11/19/2017 1009   ALT 10 11/19/2017 1009   ALKPHOS 105 07/30/2017 2157   BILITOT 0.3 11/19/2017 1009   BILITOT <0.2 06/11/2017 1655   GFRNONAA 79 11/19/2017 1009   GFRAA 91 11/19/2017 1009     Diabetic Labs (most recent): Lab Results  Component Value Date   HGBA1C 8.5 (H) 11/19/2017   HGBA1C 8.3 (H) 07/29/2017   HGBA1C 10.9 04/16/2017     Lipid Panel ( most recent) Lipid Panel     Component Value Date/Time   CHOL 148 11/19/2017 1009   TRIG 130 11/19/2017 1009   HDL 66 11/19/2017 1009   CHOLHDL 2.2 11/19/2017 1009   LDLCALC 61 11/19/2017 1009      Assessment & Plan:   1. Uncontrolled type 2 diabetes mellitus with hyperglycemia (HCC)  - Roanna Reaves Mynhier has currently uncontrolled symptomatic type 2 DM since 67 years of age. -She returns with significantly improved fasting blood glucose profile, still slightly above target postprandial glycemic profile.    -Her recent labs show A1c of 8.4%, overall improving from 10.9%.     Recent labs reviewed.  -She did not report gross complications from her diabetes, however Morghan Kester remains at a high risk for more acute and chronic complications which include CAD, CVA,  CKD, retinopathy, and neuropathy. These are all discussed in detail with the patient.  - I have counseled her on diet management and weight loss, by adopting a carbohydrate restricted/protein rich diet.  -She still admits to dietary discretion including consumption of sweetened beverages.  -  Suggestion is made for her to avoid simple carbohydrates  from her diet including Cakes, Sweet Desserts / Pastries, Ice Cream, Soda (diet and regular), Sweet Tea, Candies, Chips, Cookies, Store Bought Juices, Alcohol in Excess of  1-2 drinks a day, Artificial Sweeteners, and "Sugar-free" Products. This will help patient to have stable blood glucose profile and potentially avoid unintended weight gain.  - I encouraged her to switch to  unprocessed or minimally processed complex starch and increased protein intake (animal or plant source), fruits, and vegetables.  - she is advised to stick to a routine mealtimes to eat 3 meals  a day and avoid unnecessary snacks ( to snack only to correct hypoglycemia).   - I have approached her with the following individualized plan to manage diabetes and patient agrees:   -She has responded to basal insulin with controlled fasting glycemic profile and near target postprandial glycemic profile.   -She is comfortable using her insulin.  She is advised to continueTresiba 30 units nightly, associated with monitoring of blood glucose 2 times a day-before breakfast and at bedtime.  -Patient is encouraged to call clinic for blood glucose levels less than 70 or above 300 mg /dl. -She is advised to continue metformin 1000 mg p.o. twice daily after breakfast and supper, therapeutically suitable for patient .  - she is not a suitable candidate for incretin therapy given recent history of  pancreatitis.    - Patient specific target  A1c;  LDL, HDL, Triglycerides, and  Waist Circumference were discussed in detail.  2) BP/HTN: Her blood pressure is controlled to target.  She is advised to continue her current blood pressure medications including lisinopril 10 mg daily.    3) Lipids/HPL:   She has hypertriglyceridemia, history of pancreatitis.  She is advised to continue lovastatin 20 mg nightly.  Marland Kitchen  4)  Weight/Diet: CDE Consult is declined by the patient,  exercise, and detailed carbohydrates information provided.  5) Chronic Care/Health Maintenance:  -she  is on ACEI/ARB and Statin medications and  is encouraged to initiate and continue to follow up with Ophthalmology, Dentist,  Podiatrist at least yearly or according to recommendations, and advised to quit tobacco products. I have recommended yearly flu vaccine and pneumonia vaccine at least every 5 years; moderate intensity exercise for up to 150 minutes weekly; and  sleep for at least 7 hours a day.  - I advised patient to maintain close follow up with Danelle Berry, PA-C for primary care needs.  - Time spent with the patient: 25 min, of which >50% was spent in reviewing her blood glucose logs , discussing her hypo- and hyper-glycemic episodes, reviewing her current and  previous labs and insulin doses and developing a plan to avoid hypo- and hyper-glycemia. Please refer to Patient Instructions for Blood Glucose Monitoring and Insulin/Medications Dosing Guide"  in media tab for additional information. Christene Slates Reichard participated in the discussions, expressed understanding, and voiced agreement with the above plans.  All questions were answered to her satisfaction. she is encouraged to contact clinic should she have any questions or concerns prior to her return visit.  Follow up plan: - Return in about 4 months (around 03/26/2018) for Follow up with Pre-visit Labs, Meter,  and Logs.  Marquis Lunch, MD [facility]  [facility] [address] [place], [place] [address] Phone: [phone]  Fax: [phone]    11/25/2017, 4:53 PM  This note was partially dictated with voice recognition software. Similar sounding words can be transcribed inadequately or may not  be corrected upon review.

## 2017-11-25 NOTE — Patient Instructions (Signed)

## 2017-12-13 ENCOUNTER — Other Ambulatory Visit: Payer: Self-pay | Admitting: "Endocrinology

## 2017-12-13 ENCOUNTER — Telehealth: Payer: Self-pay | Admitting: "Endocrinology

## 2017-12-13 DIAGNOSIS — E118 Type 2 diabetes mellitus with unspecified complications: Secondary | ICD-10-CM

## 2017-12-13 MED ORDER — METFORMIN HCL 1000 MG PO TABS: 1000.0000 mg | ORAL_TABLET | Freq: Two times a day (BID) | ORAL | 3 refills | Status: DC

## 2017-12-13 NOTE — Telephone Encounter (Signed)
Destiny Hurley is calling stating that she needs a refill on Metformin the new dose needs to be faxed in of 1000mg  daily also she is asking if Dr. Fransico HimNida could prescribe her a cheaper insulin she is paying $100.00 for the current insulin please advise?

## 2017-12-13 NOTE — Telephone Encounter (Signed)
I will send a prescription for metformin to her pharmacy, I suggest she stay on her current insulin for now.  We will discuss other options of insulin during her next visit.

## 2017-12-14 ENCOUNTER — Other Ambulatory Visit: Payer: Self-pay

## 2017-12-14 MED ORDER — LOVASTATIN 20 MG PO TABS: 20.0000 mg | ORAL_TABLET | Freq: Every day | ORAL | 0 refills | Status: DC

## 2017-12-15 ENCOUNTER — Other Ambulatory Visit: Payer: Self-pay | Admitting: Gastroenterology

## 2017-12-20 DIAGNOSIS — R109 Unspecified abdominal pain: Secondary | ICD-10-CM | POA: Diagnosis not present

## 2017-12-20 DIAGNOSIS — K2270 Barrett's esophagus without dysplasia: Secondary | ICD-10-CM | POA: Diagnosis not present

## 2017-12-20 DIAGNOSIS — K229 Disease of esophagus, unspecified: Secondary | ICD-10-CM | POA: Diagnosis not present

## 2017-12-20 DIAGNOSIS — R0781 Pleurodynia: Secondary | ICD-10-CM | POA: Diagnosis not present

## 2017-12-20 DIAGNOSIS — Z800 Family history of malignant neoplasm of digestive organs: Secondary | ICD-10-CM | POA: Diagnosis not present

## 2017-12-20 DIAGNOSIS — Z9049 Acquired absence of other specified parts of digestive tract: Secondary | ICD-10-CM | POA: Diagnosis not present

## 2017-12-20 DIAGNOSIS — K219 Gastro-esophageal reflux disease without esophagitis: Secondary | ICD-10-CM | POA: Diagnosis not present

## 2017-12-21 ENCOUNTER — Encounter (HOSPITAL_COMMUNITY): Payer: Self-pay | Admitting: *Deleted

## 2017-12-21 ENCOUNTER — Other Ambulatory Visit: Payer: Self-pay

## 2017-12-22 ENCOUNTER — Encounter (HOSPITAL_COMMUNITY): Payer: Self-pay | Admitting: Anesthesiology

## 2017-12-22 ENCOUNTER — Ambulatory Visit (HOSPITAL_COMMUNITY): Payer: Medicare HMO | Admitting: Anesthesiology

## 2017-12-22 ENCOUNTER — Encounter (HOSPITAL_COMMUNITY)
Admission: RE | Disposition: A | Discharge status: Home / Self Care | Payer: Self-pay | Source: Ambulatory Visit | Attending: Gastroenterology

## 2017-12-22 ENCOUNTER — Ambulatory Visit (HOSPITAL_COMMUNITY)
Admission: RE | Admit: 2017-12-22 | Discharge: 2017-12-22 | Disposition: A | Discharge status: Home / Self Care | Payer: Medicare HMO | Source: Ambulatory Visit | Attending: Gastroenterology | Admitting: Gastroenterology

## 2017-12-22 DIAGNOSIS — K228 Other specified diseases of esophagus: Secondary | ICD-10-CM | POA: Insufficient documentation

## 2017-12-22 DIAGNOSIS — E119 Type 2 diabetes mellitus without complications: Secondary | ICD-10-CM | POA: Diagnosis not present

## 2017-12-22 DIAGNOSIS — I10 Essential (primary) hypertension: Secondary | ICD-10-CM | POA: Diagnosis not present

## 2017-12-22 DIAGNOSIS — E7800 Pure hypercholesterolemia, unspecified: Secondary | ICD-10-CM | POA: Insufficient documentation

## 2017-12-22 DIAGNOSIS — Z794 Long term (current) use of insulin: Secondary | ICD-10-CM | POA: Insufficient documentation

## 2017-12-22 DIAGNOSIS — Z79899 Other long term (current) drug therapy: Secondary | ICD-10-CM | POA: Insufficient documentation

## 2017-12-22 DIAGNOSIS — R1084 Generalized abdominal pain: Secondary | ICD-10-CM | POA: Diagnosis not present

## 2017-12-22 DIAGNOSIS — E782 Mixed hyperlipidemia: Secondary | ICD-10-CM | POA: Diagnosis not present

## 2017-12-22 DIAGNOSIS — R932 Abnormal findings on diagnostic imaging of liver and biliary tract: Secondary | ICD-10-CM | POA: Diagnosis not present

## 2017-12-22 HISTORY — PX: EUS: SHX5427

## 2017-12-22 LAB — GLUCOSE, CAPILLARY: Glucose-Capillary: 197 mg/dL — ABNORMAL HIGH (ref 70–99)

## 2017-12-22 SURGERY — ULTRASOUND, UPPER GI TRACT, ENDOSCOPIC
Anesthesia: Monitor Anesthesia Care

## 2017-12-22 MED ORDER — PROPOFOL 500 MG/50ML IV EMUL
INTRAVENOUS | Status: DC | PRN
  Administered 2017-12-22: 120 ug/kg/min via INTRAVENOUS

## 2017-12-22 MED ORDER — SODIUM CHLORIDE 0.9 % IV SOLN: INTRAVENOUS | Status: DC

## 2017-12-22 MED ORDER — PROPOFOL 10 MG/ML IV BOLUS
INTRAVENOUS | Status: AC
  Filled 2017-12-22: qty 20

## 2017-12-22 MED ORDER — PROPOFOL 10 MG/ML IV BOLUS
INTRAVENOUS | Status: DC | PRN
  Administered 2017-12-22 (×5): 20 mg via INTRAVENOUS

## 2017-12-22 MED ORDER — LIDOCAINE 2% (20 MG/ML) 5 ML SYRINGE
INTRAMUSCULAR | Status: DC | PRN
  Administered 2017-12-22 (×2): 100 mg via INTRAVENOUS

## 2017-12-22 MED ORDER — LABETALOL HCL 5 MG/ML IV SOLN
INTRAVENOUS | Status: DC | PRN
  Administered 2017-12-22: 5 mg via INTRAVENOUS

## 2017-12-22 MED ORDER — LACTATED RINGERS IV SOLN
INTRAVENOUS | Status: DC
  Administered 2017-12-22: 1000 mL via INTRAVENOUS

## 2017-12-22 MED ORDER — PROPOFOL 10 MG/ML IV BOLUS
INTRAVENOUS | Status: AC
  Filled 2017-12-22: qty 40

## 2017-12-22 NOTE — H&P (Signed)
Patient interval history reviewed.  Patient examined again.  There has been no change from documented H/P (scanned into chart from our office) except as documented above.  Assessment:  1.  Esophageal submucosal nodule. 2.  Abdominal pains, not related to above findings.  Plan:  1.  Endoscopic ultrasound with possible fine needle aspiration. 2.  Risks (bleeding, infection, bowel perforation that could require surgery, sedation-related changes in cardiopulmonary systems), benefits (identification and possible treatment of source of symptoms, exclusion of certain causes of symptoms), and alternatives (watchful waiting, radiographic imaging studies, empiric medical treatment) of upper endoscopy with ultrasound and possible fine needle aspiration (EUS +/- FNA) were explained to patient/family in detail and patient wishes to proceed.

## 2017-12-22 NOTE — Anesthesia Procedure Notes (Signed)
Date/Time: 12/22/2017 10:05 AM Performed by: Florene Routeeardon, Katalaya Beel L, CRNA Oxygen Delivery Method: Nasal cannula

## 2017-12-22 NOTE — Anesthesia Preprocedure Evaluation (Addendum)
Anesthesia Evaluation  Patient identified by MRN, date of birth, ID band Patient awake    Reviewed: Allergy & Precautions, NPO status , Patient's Chart, lab work & pertinent test results  Airway Mallampati: II  TM Distance: >3 FB Neck ROM: Full    Dental no notable dental hx.    Pulmonary neg pulmonary ROS,    Pulmonary exam normal breath sounds clear to auscultation       Cardiovascular hypertension, Pt. on medications Normal cardiovascular exam Rhythm:Regular Rate:Normal     Neuro/Psych negative neurological ROS  negative psych ROS   GI/Hepatic negative GI ROS, Neg liver ROS,   Endo/Other  diabetes, Type 2, Insulin Dependent  Renal/GU negative Renal ROS  negative genitourinary   Musculoskeletal negative musculoskeletal ROS (+)   Abdominal   Peds negative pediatric ROS (+)  Hematology negative hematology ROS (+)   Anesthesia Other Findings   Reproductive/Obstetrics negative OB ROS                            Anesthesia Physical Anesthesia Plan  ASA: II  Anesthesia Plan: MAC   Post-op Pain Management:    Induction: Intravenous  PONV Risk Score and Plan: 2 and Treatment may vary due to age or medical condition  Airway Management Planned: Nasal Cannula  Additional Equipment:   Intra-op Plan:   Post-operative Plan:   Informed Consent: I have reviewed the patients History and Physical, chart, labs and discussed the procedure including the risks, benefits and alternatives for the proposed anesthesia with the patient or authorized representative who has indicated his/her understanding and acceptance.   Dental advisory given  Plan Discussed with: CRNA  Anesthesia Plan Comments:         Anesthesia Quick Evaluation

## 2017-12-22 NOTE — Transfer of Care (Signed)
Immediate Anesthesia Transfer of Care Note  Patient: Destiny Hurley  Procedure(s) Performed: FULL UPPER ENDOSCOPIC ULTRASOUND (EUS) RADIAL (N/A ) FINE NEEDLE ASPIRATION (FNA) RADIAL (N/A )  Patient Location: Endoscopy Unit  Anesthesia Type:MAC  Level of Consciousness: drowsy  Airway & Oxygen Therapy: Patient Spontanous Breathing and Patient connected to nasal cannula oxygen  Post-op Assessment: Report given to RN and Post -op Vital signs reviewed and stable  Post vital signs: Reviewed and stable  Last Vitals:  Vitals Value Taken Time  BP    Temp    Pulse    Resp    SpO2      Last Pain:  Vitals:   12/22/17 0839  TempSrc: Oral      Patients Stated Pain Goal: 10 (25/36/64 4034)  Complications: No apparent anesthesia complications

## 2017-12-22 NOTE — Discharge Instructions (Signed)
YOU HAD AN ENDOSCOPIC PROCEDURE TODAY: Refer to the procedure report and other information in the discharge instructions given to you for any specific questions about what was found during the examination. If this information does not answer your questions, please call [facility] office at [phone] to clarify.   YOU SHOULD EXPECT: Some feelings of bloating in the abdomen. Passage of more gas than usual. Walking can help get rid of the air that was put into your GI tract during the procedure and reduce the bloating. Some abdominal soreness may be present for a day or two, also.  DIET: Your first meal following the procedure should be a light meal and then it is ok to progress to your normal diet. A half-sandwich or bowl of soup is an example of a good first meal. Heavy or fried foods are harder to digest and may make you feel nauseous or bloated. Drink plenty of fluids but you should avoid alcoholic beverages for 24 hours. If you had a esophageal dilation, please see attached instructions for diet.    ACTIVITY: Your care partner should take you home directly after the procedure. You should plan to take it easy, moving slowly for the rest of the day. You can resume normal activity the day after the procedure however YOU SHOULD NOT DRIVE, use power tools, machinery or perform tasks that involve climbing or major physical exertion for 24 hours (because of the sedation medicines used during the test).   SYMPTOMS TO REPORT IMMEDIATELY: A gastroenterologist can be reached at any hour. Please call [phone]  for any of the following symptoms:   . Following upper endoscopy (EGD, EUS, ERCP, esophageal dilation) Vomiting of blood or coffee ground material  New, significant abdominal pain  New, significant chest pain or pain under the shoulder blades  Painful or persistently difficult swallowing  New shortness of breath  Black, tarry-looking or red, bloody stools  FOLLOW UP:  If any biopsies were taken  you will be contacted by phone or by letter within the next 1-3 weeks. Call [phone]  if you have not heard about the biopsies in 3 weeks.  Please also call with any specific questions about appointments or follow up tests. 

## 2017-12-22 NOTE — Progress Notes (Signed)
Patients blood pressure is high see flowsheet. 201/82. This is consistent with preop blood pressure. Patient did not take lisinopril this am. Dr. Acey Lavarignan made aware. Pt has home med with her and took at this time. Dr. Acey Lavarignan is fine with this plan and no other orders. He is fine with her going home after taking home med PO.

## 2017-12-22 NOTE — Anesthesia Postprocedure Evaluation (Signed)
Anesthesia Post Note  Patient: Destiny Hurley  Procedure(s) Performed: FULL UPPER ENDOSCOPIC ULTRASOUND (EUS) RADIAL (N/A ) FINE NEEDLE ASPIRATION (FNA) RADIAL (N/A )     Patient location during evaluation: PACU Anesthesia Type: MAC Level of consciousness: awake and alert Pain management: pain level controlled Vital Signs Assessment: post-procedure vital signs reviewed and stable Respiratory status: spontaneous breathing, nonlabored ventilation, respiratory function stable and patient connected to nasal cannula oxygen Cardiovascular status: stable and blood pressure returned to baseline Postop Assessment: no apparent nausea or vomiting Anesthetic complications: no    Last Vitals:  Vitals:   12/22/17 1055 12/22/17 1100  BP:  (!) 208/86  Pulse: 86 78  Resp: 17 18  Temp:    SpO2: 97% 97%    Last Pain:  Vitals:   12/22/17 1100  TempSrc:   PainSc: 0-No pain                 Montez Hageman

## 2017-12-23 ENCOUNTER — Encounter (HOSPITAL_COMMUNITY): Payer: Self-pay | Admitting: Gastroenterology

## 2017-12-23 NOTE — Op Note (Addendum)
[facility] Patient Name: Destiny Hurley Procedure Date: 12/22/2017 MRN: [id number] Attending MD: Destiny Hurley , MD Date of Birth: 09/11/1950 CSN: [id number] Age: 67 Admit Type: Outpatient Procedure:                Upper EUS Indications:              Esophageal mucosal mass/polyp found on endoscopy,                            Generalized abdominal pain Providers:                Destiny Modena, MD, Destiny Sarna, RN, Destiny Hurley, Technician Referring MD:             Destiny Der, MD Medicines:                Monitored Anesthesia Care Complications:            No immediate complications. Estimated Blood Loss:     Estimated blood loss: none. Procedure:                Pre-Anesthesia Assessment:                           - Prior to the procedure, a History and Physical                            was performed, and patient medications and                            allergies were reviewed. The patient's tolerance of                            previous anesthesia was also reviewed. The risks                            and benefits of the procedure and the sedation                            options and risks were discussed with the patient.                            All questions were answered, and informed consent                            was obtained. Prior Anticoagulants: The patient has                            taken no previous anticoagulant or antiplatelet                            agents. ASA Grade Assessment: II - A patient with  mild systemic disease. After reviewing the risks                            and benefits, the patient was deemed in                            satisfactory condition to undergo the procedure.                           After obtaining informed consent, the endoscope was                            passed under direct vision. Throughout the                            procedure,  the patient's blood pressure, pulse, and                            oxygen saturations were monitored continuously. The                            [id number] ([id number]) Olympus Radial EUS was                            introduced through the mouth, and advanced to the                            second part of duodenum. The upper EUS was                            accomplished without difficulty. The patient                            tolerated the procedure well. Scope In: Scope Out: Findings:      ENDOSCOPIC FINDING: :      A single small submucosal nodule with a localized distribution was found       in the upper third of the esophagus, 20 cm from the incisors.      ENDOSONOGRAPHIC FINDING: :      An oval intramural (subepithelial) lesion was found in the cervical       esophagus. It was encountered at 20 cm from the incisors and extended to       21 cm. The lesion was anechoic or intensely hypoechoic. Sonographically,       the origin appeared to be within the deep mucosa (Layer 2). The lesion       also appeared to involve the following wall layer(s): submucosa (Layer       3). The endosonographic borders were well-defined. No wall thickening or       periesophageal adenopathy seen.      There was abnormal echogenicity in the left lobe of the liver. This area       was hyperechoic.      No lymphadenopathy seen.      There was no sign of significant endosonographic abnormality in the       common bile duct. The maximum diameter  of the duct was 6 mm.      There was no sign of significant endosonographic abnormality in the       pancreatic head, genu of the pancreas, pancreatic body and pancreatic       tail. No pathologic lymphadenopathy, no masses, no cysts. Impression:               - Submucosal nodule found in the esophagus.                           - An intramural (subepithelial) lesion was found in                            the cervical esophagus. It appeared to originate                             from within the deep mucosa (Layer 2). Most                            consistent with either leiomyoma from muscularis                            mucosa or esophageal cyst. Lesion too small and                            fraught with too much air shadowing from the                            trachea to make FNA of this area feasible.                           - There was abnormal echogenicity in the left lobe                            of the liver. This was hyperechoic.                           - There was no sign of significant pathology in the                            common bile duct.                           - There was no sign of significant pathology in the                            pancreatic head, genu of the pancreas, pancreatic                            body and pancreatic tail.                           - No specimens collected. Moderate Sedation:      None Recommendation:           -  Discharge patient to home (via wheelchair).                           - Resume previous diet today.                           - Continue present medications.                           - Return to GI clinic (Dr. Levora AngelBrahmbhatt) as                            previously scheduled.                           - Might consider repeat EGD/EUS in 1-2 years. Procedure Code(s):        --- Professional ---                           [phone], Esophagogastroduodenoscopy, flexible,                            transoral; with endoscopic ultrasound examination,                            including the esophagus, stomach, and either the                            duodenum or a surgically altered stomach where the                            jejunum is examined distal to the anastomosis Diagnosis Code(s):        --- Professional ---                           K22.8, Other specified diseases of esophagus                           R10.84, Generalized abdominal pain                            R93.2, Abnormal findings on diagnostic imaging of                            liver and biliary tract CPT copyright 2018 American Medical Association. All rights reserved. The codes documented in this report are preliminary and upon coder review may  be revised to meet current compliance requirements. Destiny ModenaWilliam [place] Giammona, MD 12/22/2017 10:35:45 AM This report has been signed electronically. Number of Addenda: 0

## 2018-01-03 DIAGNOSIS — M25511 Pain in right shoulder: Secondary | ICD-10-CM | POA: Diagnosis not present

## 2018-01-03 DIAGNOSIS — R1084 Generalized abdominal pain: Secondary | ICD-10-CM | POA: Diagnosis not present

## 2018-01-03 DIAGNOSIS — K469 Unspecified abdominal hernia without obstruction or gangrene: Secondary | ICD-10-CM | POA: Diagnosis not present

## 2018-01-03 DIAGNOSIS — R0789 Other chest pain: Secondary | ICD-10-CM | POA: Diagnosis not present

## 2018-01-03 DIAGNOSIS — M25512 Pain in left shoulder: Secondary | ICD-10-CM | POA: Diagnosis not present

## 2018-01-03 DIAGNOSIS — K429 Umbilical hernia without obstruction or gangrene: Secondary | ICD-10-CM | POA: Diagnosis not present

## 2018-01-03 DIAGNOSIS — E279 Disorder of adrenal gland, unspecified: Secondary | ICD-10-CM | POA: Diagnosis not present

## 2018-01-03 DIAGNOSIS — R10817 Generalized abdominal tenderness: Secondary | ICD-10-CM | POA: Diagnosis not present

## 2018-01-03 DIAGNOSIS — N644 Mastodynia: Secondary | ICD-10-CM | POA: Diagnosis not present

## 2018-01-03 DIAGNOSIS — G8929 Other chronic pain: Secondary | ICD-10-CM | POA: Diagnosis not present

## 2018-01-05 DIAGNOSIS — R102 Pelvic and perineal pain: Secondary | ICD-10-CM | POA: Diagnosis not present

## 2018-01-05 DIAGNOSIS — Z124 Encounter for screening for malignant neoplasm of cervix: Secondary | ICD-10-CM | POA: Diagnosis not present

## 2018-01-10 DIAGNOSIS — R102 Pelvic and perineal pain: Secondary | ICD-10-CM | POA: Diagnosis not present

## 2018-01-24 ENCOUNTER — Other Ambulatory Visit: Payer: Self-pay | Admitting: Family Medicine

## 2018-02-21 DIAGNOSIS — K219 Gastro-esophageal reflux disease without esophagitis: Secondary | ICD-10-CM | POA: Diagnosis not present

## 2018-02-21 DIAGNOSIS — R109 Unspecified abdominal pain: Secondary | ICD-10-CM | POA: Diagnosis not present

## 2018-02-21 DIAGNOSIS — L209 Atopic dermatitis, unspecified: Secondary | ICD-10-CM | POA: Diagnosis not present

## 2018-02-21 DIAGNOSIS — E1169 Type 2 diabetes mellitus with other specified complication: Secondary | ICD-10-CM | POA: Diagnosis not present

## 2018-03-16 DIAGNOSIS — E1165 Type 2 diabetes mellitus with hyperglycemia: Secondary | ICD-10-CM | POA: Diagnosis not present

## 2018-03-16 DIAGNOSIS — E559 Vitamin D deficiency, unspecified: Secondary | ICD-10-CM | POA: Diagnosis not present

## 2018-03-17 LAB — COMPLETE METABOLIC PANEL WITH GFR
AG Ratio: 1.7 (calc) (ref 1.0–2.5)
ALKALINE PHOSPHATASE (APISO): 88 U/L (ref 37–153)
ALT: 9 U/L (ref 6–29)
AST: 13 U/L (ref 10–35)
Albumin: 4.2 g/dL (ref 3.6–5.1)
BUN: 12 mg/dL (ref 7–25)
CALCIUM: 10.5 mg/dL — AB (ref 8.6–10.4)
CO2: 29 mmol/L (ref 20–32)
CREATININE: 0.75 mg/dL (ref 0.50–0.99)
Chloride: 101 mmol/L (ref 98–110)
GFR, EST NON AFRICAN AMERICAN: 84 mL/min/{1.73_m2} (ref >=60)
GFR, Est African American: 97 mL/min/{1.73_m2} (ref >=60)
GLUCOSE: 120 mg/dL — AB (ref 65–99)
Globulin: 2.5 g/dL (calc) (ref 1.9–3.7)
Potassium: 4 mmol/L (ref 3.5–5.3)
SODIUM: 142 mmol/L (ref 135–146)
Total Bilirubin: 0.4 mg/dL (ref 0.2–1.2)
Total Protein: 6.7 g/dL (ref 6.1–8.1)

## 2018-03-17 LAB — VITAMIN D 25 HYDROXY (VIT D DEFICIENCY, FRACTURES): Vit D, 25-Hydroxy: 40 ng/mL (ref 30–100)

## 2018-03-17 LAB — HEMOGLOBIN A1C
HEMOGLOBIN A1C: 8.2 %{Hb} — AB (ref <5.7)
MEAN PLASMA GLUCOSE: 189 (calc)
eAG (mmol/L): 10.4 (calc)

## 2018-03-29 ENCOUNTER — Encounter: Payer: Self-pay | Admitting: "Endocrinology

## 2018-03-29 ENCOUNTER — Ambulatory Visit (INDEPENDENT_AMBULATORY_CARE_PROVIDER_SITE_OTHER): Payer: Medicare HMO | Admitting: "Endocrinology

## 2018-03-29 VITALS — BP 141/88 | HR 93 | Ht 65.0 in | Wt 180.0 lb

## 2018-03-29 DIAGNOSIS — E118 Type 2 diabetes mellitus with unspecified complications: Secondary | ICD-10-CM

## 2018-03-29 DIAGNOSIS — E782 Mixed hyperlipidemia: Secondary | ICD-10-CM | POA: Diagnosis not present

## 2018-03-29 DIAGNOSIS — I10 Essential (primary) hypertension: Secondary | ICD-10-CM

## 2018-03-29 NOTE — Progress Notes (Signed)
Endocrinology follow-up note       03/29/2018, 5:24 PM   Subjective:    Patient ID: Destiny Hurley, female    DOB: [birth year].  Destiny Hurley is being seen in follow-up for management of currently uncontrolled symptomatic currently uncontrolled symptomatic type 2 diabetes, hyperlipidemia, hypertension. PCP:  Patient, No Pcp Per.   Past Medical History:  Diagnosis Date  . Adrenal mass (HCC) 2012   Destiny Hurley  . Chronic painful diabetic neuropathy (HCC)   . Depression    situational  . Diabetes mellitus   . Dysplasia of tongue   . Esophageal mass   . Heart murmur    Echo 2/09 revealing aortic sclerosis without gradient  . Hyperlipemia   . Hypertension   . Left shoulder pain   . Pancreatitis 07/30/2017   Lipase critically elevated, greater than 3x normal   Past Surgical History:  Procedure Laterality Date  . CHOLECYSTECTOMY    . COLONOSCOPY    . COLONOSCOPY WITH PROPOFOL N/A 05/05/2017   Procedure: COLONOSCOPY WITH PROPOFOL;  Surgeon: Toledo, Boykin Nearing, MD;  Location: [facility] ENDOSCOPY;  Service: Gastroenterology;  Laterality: N/A;  . EUS N/A 12/22/2017   Procedure: FULL UPPER ENDOSCOPIC ULTRASOUND (EUS) RADIAL;  Surgeon: Willis Modena, MD;  Location: [facility] ENDOSCOPY;  Service: Endoscopy;  Laterality: N/A;  . schatzki's ring     s/p dilation 2/04  . TUBAL LIGATION     Social History   Socioeconomic History  . Marital status: Single    Spouse name: Not on file  . Number of children: Not on file  . Years of education: Not on file  . Highest education level: Not on file  Occupational History  . Not on file  Social Needs  . Financial resource strain: Not on file  . Food insecurity:    Worry: Not on file    Inability: Not on file  . Transportation needs:    Medical: Not on file    Non-medical: Not on file  Tobacco Use  . Smoking status: Never Smoker  . Smokeless tobacco:  Current User    Types: Chew  . Tobacco comment: Chews 1 pack per day  Substance and Sexual Activity  . Alcohol use: Never    Frequency: Never  . Drug use: No  . Sexual activity: Not Currently  Lifestyle  . Physical activity:    Days per week: Not on file    Minutes per session: Not on file  . Stress: Not on file  Relationships  . Social connections:    Talks on phone: Not on file    Gets together: Not on file    Attends religious service: Not on file    Active member of club or organization: Not on file    Attends meetings of clubs or organizations: Not on file    Relationship status: Not on file  Other Topics Concern  . Not on file  Social History Narrative  . Not on file   Outpatient Encounter Medications as of 03/29/2018  Medication Sig  . Insulin Glargine (LANTUS) 100 UNIT/ML Solostar Hurley Inject 30 Units into the skin  at bedtime.  Marland Kitchen acetaminophen (TYLENOL) 500 MG tablet Take 1,000 mg by mouth every 6 (six) hours as needed for moderate pain or headache.  . Cholecalciferol (VITAMIN D3) 125 MCG (5000 UT) CAPS Take 1 capsule (5,000 Units total) by mouth daily.  Marland Kitchen gabapentin (NEURONTIN) 600 MG tablet Take 2 tablets (1,200 mg total) by mouth 2 (two) times daily. (Patient taking differently: Take 600 mg by mouth 2 (two) times daily. )  . lisinopril (PRINIVIL,ZESTRIL) 10 MG tablet Take 1 tablet (10 mg total) by mouth daily.  Marland Kitchen lovastatin (MEVACOR) 20 MG tablet TAKE 1 TABLET BY MOUTH ONCE DAILY  . Menthol, Topical Analgesic, (ABSORBINE JR BACK PATCH EX) Apply 1 patch topically daily.  . metFORMIN (GLUCOPHAGE) 1000 MG tablet Take 1 tablet (1,000 mg total) by mouth 2 (two) times daily with a meal.  . pantoprazole (PROTONIX) 40 MG tablet Take 1 tablet (40 mg total) by mouth 2 (two) times daily. (Patient taking differently: Take 40 mg by mouth daily. )  . vitamin B-12 (CYANOCOBALAMIN) 500 MCG tablet Take 500 mcg by mouth daily.  . vitamin C (ASCORBIC ACID) 500 MG tablet Take 500 mg by  mouth daily.  . [DISCONTINUED] Insulin Degludec (TRESIBA FLEXTOUCH) 200 UNIT/ML SOPN Inject 30 Units into the skin at bedtime.   No facility-administered encounter medications on file as of 03/29/2018.     ALLERGIES: Allergies  Allergen Reactions  . Duloxetine Rash  . Sitagliptin Other (See Comments)    pancreatitis  . Dulaglutide Nausea Only and Nausea And Vomiting  . Hydrocodone Other (See Comments)    Edema   . Lyrica [Pregabalin] Other (See Comments)    Edema    VACCINATION STATUS:  There is no immunization history on file for this patient.  Diabetes  She presents for her follow-up diabetic visit. She has type 2 diabetes mellitus. Onset time: She was diagnosed at approximate age of 68 years. Her disease course has been improving. There are no hypoglycemic associated symptoms. Pertinent negatives for hypoglycemia include no confusion, headaches, pallor or seizures. Pertinent negatives for diabetes include no chest pain, no fatigue, no polydipsia and no polyphagia. There are no hypoglycemic complications. Symptoms are improving. There are no diabetic complications. Risk factors for coronary artery disease include diabetes mellitus, dyslipidemia, family history, hypertension, obesity, sedentary lifestyle, post-menopausal and tobacco exposure. Current diabetic treatment includes oral agent (dual therapy). Compliance with diabetes treatment: She is currently on metformin 500 mg p.o. twice daily, Invokana 300 mg p.o. daily. Her weight is fluctuating minimally. She is following a generally unhealthy diet. When asked about meal planning, she reported none. She has not had a previous visit with a dietitian (Patient declined a consult.). She rarely participates in exercise. Her home blood glucose trend is increasing steadily. Her breakfast blood glucose range is generally 140-180 mg/dl. Her bedtime blood glucose range is generally 180-200 mg/dl. Her overall blood glucose range is 180-200 mg/dl. An  ACE inhibitor/angiotensin II receptor blocker is being taken. Eye exam is not current.  Hyperlipidemia  This is a chronic problem. The current episode started more than 1 year ago. The problem is controlled. Exacerbating diseases include diabetes and obesity. Pertinent negatives include no chest pain, myalgias or shortness of breath. Current antihyperlipidemic treatment includes statins. Risk factors for coronary artery disease include dyslipidemia, diabetes mellitus, hypertension, a sedentary lifestyle, post-menopausal and obesity.  Hypertension  This is a chronic problem. The current episode started more than 1 year ago. The problem is controlled. Pertinent negatives include no chest  pain, headaches, palpitations or shortness of breath. Risk factors for coronary artery disease include dyslipidemia, diabetes mellitus, obesity, sedentary lifestyle, smoking/tobacco exposure, family history and post-menopausal state. Past treatments include ACE inhibitors.    Review of Systems  Constitutional: Negative for chills, fatigue, fever and unexpected weight change.  HENT: Negative for trouble swallowing and voice change.   Eyes: Negative for visual disturbance.  Respiratory: Negative for cough, shortness of breath and wheezing.   Cardiovascular: Negative for chest pain, palpitations and leg swelling.  Gastrointestinal: Negative for diarrhea, nausea and vomiting.  Endocrine: Negative for cold intolerance, heat intolerance, polydipsia and polyphagia.  Musculoskeletal: Negative for arthralgias and myalgias.  Skin: Negative for color change, pallor, rash and wound.  Neurological: Negative for seizures and headaches.  Psychiatric/Behavioral: Negative for confusion and suicidal ideas.    Objective:    BP (!) 141/88   Pulse 93   Ht 5\' 5"  (1.651 m)   Wt 180 lb (81.6 kg)   BMI 29.95 kg/m   Wt Readings from Last 3 Encounters:  03/29/18 180 lb (81.6 kg)  12/22/17 182 lb (82.6 kg)  11/25/17 184 lb (83.5  kg)     Physical Exam Constitutional:      Appearance: She is well-developed.  HENT:     Head: Normocephalic and atraumatic.  Neck:     Musculoskeletal: Normal range of motion and neck supple.     Thyroid: No thyromegaly.     Trachea: No tracheal deviation.  Cardiovascular:     Rate and Rhythm: Normal rate and regular rhythm.  Pulmonary:     Effort: Pulmonary effort is normal.     Breath sounds: Normal breath sounds.  Abdominal:     General: Bowel sounds are normal.     Palpations: Abdomen is soft.     Tenderness: There is no abdominal tenderness. There is no guarding.  Musculoskeletal: Normal range of motion.  Skin:    General: Skin is warm and dry.     Coloration: Skin is not pale.     Findings: No erythema or rash.  Neurological:     Mental Status: She is alert and oriented to person, place, and time.     Cranial Nerves: No cranial nerve deficit.     Coordination: Coordination normal.     Deep Tendon Reflexes: Reflexes are normal and symmetric.  Psychiatric:        Judgment: Judgment normal.     CMP ( most recent) CMP     Component Value Date/Time   NA 142 03/16/2018 0825   NA 141 06/11/2017 1655   K 4.0 03/16/2018 0825   CL 101 03/16/2018 0825   CO2 29 03/16/2018 0825   GLUCOSE 120 (H) 03/16/2018 0825   BUN 12 03/16/2018 0825   BUN 16 06/11/2017 1655   CREATININE 0.75 03/16/2018 0825   CALCIUM 10.5 (H) 03/16/2018 0825   PROT 6.7 03/16/2018 0825   PROT 7.6 06/11/2017 1655   ALBUMIN 4.1 07/30/2017 2157   ALBUMIN 5.0 (H) 06/11/2017 1655   AST 13 03/16/2018 0825   ALT 9 03/16/2018 0825   ALKPHOS 105 07/30/2017 2157   BILITOT 0.4 03/16/2018 0825   BILITOT <0.2 06/11/2017 1655   GFRNONAA 84 03/16/2018 0825   GFRAA 97 03/16/2018 0825     Diabetic Labs (most recent): Lab Results  Component Value Date   HGBA1C 8.2 (H) 03/16/2018   HGBA1C 8.5 (H) 11/19/2017   HGBA1C 8.3 (H) 07/29/2017     Lipid Panel ( most recent) Lipid  Panel     Component Value  Date/Time   CHOL 148 11/19/2017 1009   TRIG 130 11/19/2017 1009   HDL 66 11/19/2017 1009   CHOLHDL 2.2 11/19/2017 1009   LDLCALC 61 11/19/2017 1009      Assessment & Plan:   1. Uncontrolled type 2 diabetes mellitus with hyperglycemia (HCC)  - Destiny Hurley has currently uncontrolled symptomatic type 2 DM since 68 years of age. -She returns with continued improvement in her glycemic profile and A1c of 8.2%, generally improving from 10.9%.      -Her recent labs show A1c of 8.4%, overall improving from 10.9%.    Recent labs reviewed with her.  -She did not report gross complications from her diabetes, however Destiny Hurley remains at a high risk for more acute and chronic complications which include CAD, CVA, CKD, retinopathy, and neuropathy. These are all discussed in detail with the patient.  - I have counseled her on diet management and weight loss, by adopting a carbohydrate restricted/protein rich diet.  -She still admits to dietary discretion including consumption of sweetened beverages.  - Patient admits there is a room for improvement in her diet and drink choices. -  Suggestion is made for her to avoid simple carbohydrates  from her diet including Cakes, Sweet Desserts / Pastries, Ice Cream, Soda (diet and regular), Sweet Tea, Candies, Chips, Cookies, Store Bought Juices, Alcohol in Excess of  1-2 drinks a day, Artificial Sweeteners, and "Sugar-free" Products. This will help patient to have stable blood glucose profile and potentially avoid unintended weight gain.   - I encouraged her to switch to  unprocessed or minimally processed complex starch and increased protein intake (animal or plant source), fruits, and vegetables.  - she is advised to stick to a routine mealtimes to eat 3 meals  a day and avoid unnecessary snacks ( to snack only to correct hypoglycemia).   - I have approached her with the following individualized plan to manage diabetes and patient  agrees:   -She has responded to basal insulin with controlled fasting glycemic profile and near target postprandial glycemic profile.    -She has gotten comfortable using her insulin, advised to continue Lantus 30 units nightly  associated with monitoring of blood glucose 2 times a day-before breakfast and at bedtime.  -Patient is encouraged to call clinic for blood glucose levels less than 70 or above 300 mg /dl. -She is advised to continue metformin 1000 mg p.o. twice daily after breakfast and supper, therapeutically suitable for patient .  - she is not a suitable candidate for incretin therapy given recent history of pancreatitis.    - Patient specific target  A1c;  LDL, HDL, Triglycerides, and  Waist Circumference were discussed in detail.  2) BP/HTN: Her blood pressure is not controlled to target.  She is advised to continue her current blood pressure medications including lisinopril 10 mg daily.    3) Lipids/HPL:   She has hypertriglyceridemia-improving to 130, history of pancreatitis.  She is advised to continue lovastatin 20 mg nightly.   4)  Weight/Diet: CDE Consult is declined by the patient,  exercise, and detailed carbohydrates information provided.  5) Chronic Care/Health Maintenance:  -she  is on ACEI/ARB and Statin medications and  is encouraged to initiate and continue to follow up with Ophthalmology, Dentist,  Podiatrist at least yearly or according to recommendations, and advised to quit tobacco products. I have recommended yearly flu vaccine and pneumonia vaccine at least every 5 years;  moderate intensity exercise for up to 150 minutes weekly; and  sleep for at least 7 hours a day.  - I advised patient to maintain close follow up with Patient, No Pcp Per for primary care needs.  - Time spent with the patient: 25 min, of which >50% was spent in reviewing her blood glucose logs , discussing her hypoglycemia and hyperglycemia episodes, reviewing her current and  previous labs  / studies and medications  doses and developing a plan to avoid hypoglycemia and hyperglycemia. Please refer to Patient Instructions for Blood Glucose Monitoring and Insulin/Medications Dosing Guide"  in media tab for additional information. Please  also refer to " Patient Self Inventory" in the Media  tab for reviewed elements of pertinent patient history.  Destiny Hurley participated in the discussions, expressed understanding, and voiced agreement with the above plans.  All questions were answered to her satisfaction. she is encouraged to contact clinic should she have any questions or concerns prior to her return visit.   Follow up plan: - Return in about 4 months (around 07/29/2018) for Follow up with Pre-visit Labs, Meter, and Logs.  Marquis Lunch, MD [facility] [address] [place], [place] [address] Phone: [phone]  Fax: [phone]    03/29/2018, 5:24 PM  This note was partially dictated with voice recognition software. Similar sounding words can be transcribed inadequately or may not  be corrected upon review.

## 2018-03-29 NOTE — Patient Instructions (Signed)

## 2018-04-06 ENCOUNTER — Other Ambulatory Visit (HOSPITAL_COMMUNITY): Payer: Self-pay | Admitting: Internal Medicine

## 2018-04-06 DIAGNOSIS — R109 Unspecified abdominal pain: Secondary | ICD-10-CM | POA: Diagnosis not present

## 2018-04-06 DIAGNOSIS — E1169 Type 2 diabetes mellitus with other specified complication: Secondary | ICD-10-CM | POA: Diagnosis not present

## 2018-04-06 DIAGNOSIS — K219 Gastro-esophageal reflux disease without esophagitis: Secondary | ICD-10-CM | POA: Diagnosis not present

## 2018-04-07 ENCOUNTER — Telehealth: Payer: Self-pay | Admitting: "Endocrinology

## 2018-04-07 NOTE — Telephone Encounter (Signed)
Pt.notified

## 2018-04-07 NOTE — Telephone Encounter (Signed)
Advise her not to take Jardiance.

## 2018-04-07 NOTE — Telephone Encounter (Signed)
Pt is concerned about taking Jardiance 10 mg qd. Her PCP prescribed it yesterday but she has not taken it yet. No medication was D/C by the PCP. Please advise.    BG readings 3/17  87 3/18  117 3/19  96 Jardiance 10 mg qd  A1c on 03/17/2018 was 8.2

## 2018-04-27 ENCOUNTER — Other Ambulatory Visit: Payer: Self-pay | Admitting: "Endocrinology

## 2018-04-27 DIAGNOSIS — E118 Type 2 diabetes mellitus with unspecified complications: Secondary | ICD-10-CM

## 2018-04-28 ENCOUNTER — Other Ambulatory Visit: Payer: Self-pay

## 2018-04-28 DIAGNOSIS — E118 Type 2 diabetes mellitus with unspecified complications: Secondary | ICD-10-CM

## 2018-04-28 MED ORDER — INSULIN GLARGINE 100 UNIT/ML SOLOSTAR PEN: 30.0000 [IU] | PEN_INJECTOR | Freq: Every day | SUBCUTANEOUS | 2 refills | Status: DC

## 2018-04-28 MED ORDER — METFORMIN HCL 1000 MG PO TABS: ORAL_TABLET | ORAL | 0 refills | Status: DC

## 2018-06-09 ENCOUNTER — Other Ambulatory Visit: Payer: Self-pay

## 2018-06-09 MED ORDER — GABAPENTIN 600 MG PO TABS: 600.0000 mg | ORAL_TABLET | Freq: Two times a day (BID) | ORAL | 3 refills | Status: DC

## 2018-06-14 ENCOUNTER — Other Ambulatory Visit: Payer: Self-pay | Admitting: Family Medicine

## 2018-06-20 NOTE — Progress Notes (Signed)
Subjective:    Patient ID: Destiny RoyaltyAnnie Thacker Hurley, female    DOB: 04/30/1950, 68 y.o.   MRN: [id number]  HPI Chief Complaint  Patient presents with  . new pt    new pt, get established, chronic pain under arm pits and under breast.    She is new to the practice and here to establish care.  Previous medical care: [facility]   States she has seen 4 other PCP providers before coming here.  She is under the care of Dr. Fransico HimNida, endocrinologist, for diabetes. States she does not want to talk about her diabetes.   States her main concern is right breast pain. This has been ongoing for the past year or so. States she is not up to date on her mammogram. Pain is constant. Pain is worse with certain movements and when laying on her right side. Denies history of breast cancer. Denies skin changes or nipple discharge. Takes Aleve gel caps every night.  Tylenol did not help.   Reports having pain in multiple locations over the past year and states the pain moves around throughout her abdomen and sides. She often feels bloated and has a lot of gas. Denies fever, chills, weight loss, nausea, vomiting.  Reports having chronic diarrhea.  States she has seen GI for this. Taking Gas X and antacid.   [facility] leg and foot pain.  She had PT for this. PT helped some with her leg pain.  States her legs are "good now". Denies weakness.  Has been on lyrica in the past. Taking gabapentin for neuropathy. This is managed by her endocrinologist.   States she loses her balance at times. She has not fallen. Denies headache, dizziness, chest pain, palpitations, shortness of breath, cough.   States she is still able to take care of her daily ADLs. States she mows her yard.   Worked at [employer] in the past.   Lives [place] her daughter and [place]   Uncontrolled diabetes- last Hgb A1c 8.2% 03/16/2018  History of pancreatitis- rules out some diabetes  meds  Reviewed allergies, medications, past medical, surgical, family, and social history.    Review of Systems Pertinent positives and negatives in the history of present illness.     Objective:   Physical Exam Constitutional:      General: She is not in acute distress.    Appearance: Normal appearance. She is not ill-appearing.  Cardiovascular:     Rate and Rhythm: Normal rate and regular rhythm.     Pulses: Normal pulses.  Pulmonary:     Effort: Pulmonary effort is normal.     Breath sounds: Normal breath sounds.  Chest:     Breasts: Breasts are symmetrical.        Right: Tenderness present. No nipple discharge or skin change.        Left: No nipple discharge, skin change or tenderness.     Comments: No palpable masses. She does have marked TTP to the 11 o'clock position of her right breast.  Abdominal:     General: Abdomen is flat. Bowel sounds are normal. There is no distension.     Palpations: Abdomen is soft.     Tenderness: There is no abdominal tenderness. There is no guarding or rebound.  Skin:    General: Skin is warm and dry.  Neurological:     General: No focal deficit present.     Mental Status: She is  alert.    BP (!) 150/84   Pulse 86   Temp 98.2 F (36.8 C) (Tympanic)   Ht 5\' 5"  (1.651 m)   Wt 192 lb 9.6 oz (87.4 kg)   BMI 32.05 kg/m       Assessment & Plan:  Breast pain, right - Plan: MM DIAG BREAST TOMO BILATERAL, US BREAST LTD UNI RIGHT INC AXILLA  Essential hypertension, benign  Type 2 diabetes mellitus with diabetic neuropathy, with long-term current use of insulin (HCC)  Smokeless tobacco use  Chronic diarrhea  Encounter to establish care  Discussed importance of follow up with regular provider and benefit of continuity of care. She has been switching PCPs often.  No obvious mass or abscess to right breast. She is overdue for mammogram. Breast US and mammogram ordered.  She complains of chronic pain in multiple locations.   Reviewed recent labs, CT chest, CT abdomen and pelvis along with other results.  Discussed that her BP is not in goal range and encouraged her to check this at home periodically. We may need to increase her lisinopril or add a 2nd agent.  Counseling on healthy diet with low sodium.  Continue seeing endocrinologist for diabetes.  Advised to avoid taking NSAIDs daily and potential side effects.  She may try a probiotic for GI issues.  Follow up pending results.

## 2018-06-21 ENCOUNTER — Ambulatory Visit (INDEPENDENT_AMBULATORY_CARE_PROVIDER_SITE_OTHER): Payer: Medicare HMO | Admitting: Family Medicine

## 2018-06-21 ENCOUNTER — Encounter: Payer: Self-pay | Admitting: Family Medicine

## 2018-06-21 ENCOUNTER — Other Ambulatory Visit: Payer: Self-pay

## 2018-06-21 VITALS — BP 150/84 | HR 86 | Temp 98.2°F | Ht 65.0 in | Wt 192.6 lb

## 2018-06-21 DIAGNOSIS — N644 Mastodynia: Secondary | ICD-10-CM

## 2018-06-21 DIAGNOSIS — K529 Noninfective gastroenteritis and colitis, unspecified: Secondary | ICD-10-CM | POA: Diagnosis not present

## 2018-06-21 DIAGNOSIS — Z720 Tobacco use: Secondary | ICD-10-CM

## 2018-06-21 DIAGNOSIS — Z7689 Persons encountering health services in other specified circumstances: Secondary | ICD-10-CM | POA: Diagnosis not present

## 2018-06-21 DIAGNOSIS — I10 Essential (primary) hypertension: Secondary | ICD-10-CM | POA: Diagnosis not present

## 2018-06-21 DIAGNOSIS — E1140 Type 2 diabetes mellitus with diabetic neuropathy, unspecified: Secondary | ICD-10-CM

## 2018-06-21 DIAGNOSIS — Z794 Long term (current) use of insulin: Secondary | ICD-10-CM

## 2018-06-21 NOTE — Patient Instructions (Signed)
We will call you regarding a breast ultrasound and mammogram.   As discussed, you should avoid taking NSAIDs such as Aleve, ibuprofen, motrin on a daily basis.   You may want to try taking an over the counter Probiotic for your bloating and chronic diarrhea to see if this helps. You can choose anyone. Some examples are Align, Digestive Health, Culterelle.   Your blood pressure is high today at 150/84. Goal is less than 130/80.   I recommend that you get a blood pressure cuff and start checking your BP at home. If you are not seeing readings less than 130/80 on a regular basis then we need to address this.  Make sure you are eating a low sodium diet and increase your activity level at home.   Follow up with your endocrinologist as scheduled for your diabetes.

## 2018-07-06 ENCOUNTER — Other Ambulatory Visit: Payer: Self-pay

## 2018-07-06 ENCOUNTER — Ambulatory Visit
Admission: RE | Admit: 2018-07-06 | Discharge: 2018-07-06 | Disposition: A | Discharge status: Home / Self Care | Payer: Medicare HMO | Source: Ambulatory Visit | Attending: Family Medicine | Admitting: Family Medicine

## 2018-07-06 ENCOUNTER — Ambulatory Visit: Payer: Self-pay

## 2018-07-06 DIAGNOSIS — N644 Mastodynia: Secondary | ICD-10-CM

## 2018-07-06 DIAGNOSIS — R928 Other abnormal and inconclusive findings on diagnostic imaging of breast: Secondary | ICD-10-CM | POA: Diagnosis not present

## 2018-07-15 ENCOUNTER — Telehealth: Payer: Self-pay | Admitting: Family Medicine

## 2018-07-15 NOTE — Telephone Encounter (Signed)
Pt called to let you know that she is changing her rx's to [facility] mail order and they will be contacting you

## 2018-07-15 NOTE — Telephone Encounter (Signed)
Will look out for this.  

## 2018-07-15 NOTE — Telephone Encounter (Signed)
Please be aware 

## 2018-07-18 ENCOUNTER — Other Ambulatory Visit: Payer: Self-pay

## 2018-07-18 DIAGNOSIS — R109 Unspecified abdominal pain: Secondary | ICD-10-CM

## 2018-07-18 DIAGNOSIS — I10 Essential (primary) hypertension: Secondary | ICD-10-CM

## 2018-07-18 MED ORDER — PANTOPRAZOLE SODIUM 40 MG PO TBEC: 40.0000 mg | DELAYED_RELEASE_TABLET | Freq: Two times a day (BID) | ORAL | 0 refills | Status: DC

## 2018-07-18 MED ORDER — LOVASTATIN 20 MG PO TABS: 20.0000 mg | ORAL_TABLET | Freq: Every day | ORAL | 0 refills | Status: DC

## 2018-07-18 MED ORDER — LISINOPRIL 10 MG PO TABS: 10.0000 mg | ORAL_TABLET | Freq: Every day | ORAL | 0 refills | Status: DC

## 2018-07-18 NOTE — Telephone Encounter (Signed)
[facility] has sent a fax requesting refills on the following medications. Please advise.

## 2018-07-26 ENCOUNTER — Telehealth: Payer: Self-pay

## 2018-07-26 NOTE — Telephone Encounter (Signed)
[employer] sent message that patient need refills on 3 medications. Spoke with patient and she stated she wanted it to stay at [facility] since it was already sent there.

## 2018-07-27 ENCOUNTER — Telehealth: Payer: Self-pay | Admitting: Family Medicine

## 2018-07-27 DIAGNOSIS — I10 Essential (primary) hypertension: Secondary | ICD-10-CM

## 2018-07-27 DIAGNOSIS — R109 Unspecified abdominal pain: Secondary | ICD-10-CM

## 2018-07-27 MED ORDER — LISINOPRIL 10 MG PO TABS: 10.0000 mg | ORAL_TABLET | Freq: Every day | ORAL | 0 refills | Status: DC

## 2018-07-27 MED ORDER — LOVASTATIN 20 MG PO TABS: 20.0000 mg | ORAL_TABLET | Freq: Every day | ORAL | 0 refills | Status: DC

## 2018-07-27 MED ORDER — PANTOPRAZOLE SODIUM 40 MG PO TBEC: 40.0000 mg | DELAYED_RELEASE_TABLET | Freq: Two times a day (BID) | ORAL | 0 refills | Status: DC

## 2018-07-27 NOTE — Telephone Encounter (Signed)
I have sent them to [facility] mail order as requested

## 2018-07-27 NOTE — Telephone Encounter (Signed)
Pt called and states that refills from yesterday should have went to [facility]. She states that she [facility] but she has changed her mind.

## 2018-08-01 DIAGNOSIS — E118 Type 2 diabetes mellitus with unspecified complications: Secondary | ICD-10-CM | POA: Diagnosis not present

## 2018-08-02 LAB — COMPLETE METABOLIC PANEL WITH GFR
AG Ratio: 1.6 (calc) (ref 1.0–2.5)
ALT: 9 U/L (ref 6–29)
AST: 9 U/L — ABNORMAL LOW (ref 10–35)
Albumin: 3.8 g/dL (ref 3.6–5.1)
Alkaline phosphatase (APISO): 91 U/L (ref 37–153)
BUN: 16 mg/dL (ref 7–25)
CO2: 30 mmol/L (ref 20–32)
Calcium: 9.6 mg/dL (ref 8.6–10.4)
Chloride: 105 mmol/L (ref 98–110)
Creat: 0.89 mg/dL (ref 0.50–0.99)
GFR, EST AFRICAN AMERICAN: 78 mL/min/{1.73_m2} (ref >=60)
GFR, EST NON AFRICAN AMERICAN: 68 mL/min/{1.73_m2} (ref >=60)
Globulin: 2.4 g/dL (calc) (ref 1.9–3.7)
Glucose, Bld: 166 mg/dL — ABNORMAL HIGH (ref 65–99)
POTASSIUM: 4 mmol/L (ref 3.5–5.3)
Sodium: 142 mmol/L (ref 135–146)
Total Bilirubin: 0.3 mg/dL (ref 0.2–1.2)
Total Protein: 6.2 g/dL (ref 6.1–8.1)

## 2018-08-02 LAB — HEMOGLOBIN A1C
EAG (MMOL/L): 10.9 (calc)
Hgb A1c MFr Bld: 8.5 % of total Hgb — ABNORMAL HIGH (ref <5.7)
Mean Plasma Glucose: 197 (calc)

## 2018-08-05 ENCOUNTER — Ambulatory Visit (INDEPENDENT_AMBULATORY_CARE_PROVIDER_SITE_OTHER): Payer: Medicare HMO | Admitting: "Endocrinology

## 2018-08-05 ENCOUNTER — Other Ambulatory Visit: Payer: Self-pay

## 2018-08-05 ENCOUNTER — Encounter: Payer: Self-pay | Admitting: "Endocrinology

## 2018-08-05 DIAGNOSIS — E782 Mixed hyperlipidemia: Secondary | ICD-10-CM | POA: Diagnosis not present

## 2018-08-05 DIAGNOSIS — E118 Type 2 diabetes mellitus with unspecified complications: Secondary | ICD-10-CM

## 2018-08-05 DIAGNOSIS — I10 Essential (primary) hypertension: Secondary | ICD-10-CM

## 2018-08-05 NOTE — Progress Notes (Signed)
08/05/2018, 4:31 PM                                                     Endocrinology Telehealth Visit Follow up Note -During COVID -19 Pandemic  This visit type was conducted due to national recommendations for restrictions regarding the COVID-19 Pandemic  in an effort to limit this patient's exposure and mitigate transmission of the corona virus.  Due to her co-morbid illnesses, Destiny Hurley is at  moderate to high risk for complications without adequate follow up.  This format is felt to be most appropriate for her at this time.  I connected with this patient on 08/05/2018   by telephone and verified that I am speaking with the correct person using two identifiers. Destiny Hurley, [birth year]. she has verbally consented to this visit. All issues noted in this document were discussed and addressed. The format was not optimal for physical exam.   Subjective:    Patient ID: Destiny Hurley, female    DOB: November 21, 1950.  Destiny Hurley is being engaged in telehealth in follow-up  For management of currently uncontrolled symptomatic currently uncontrolled symptomatic type 2 diabetes, hyperlipidemia, hypertension. PCP:  Girtha Rm, NP-C.   Past Medical History:  Diagnosis Date  . Adrenal mass ([place]) 2012   Faryal pen on CT  . Chronic painful diabetic neuropathy ([place])   . Depression    situational  . Diabetes mellitus   . Dysplasia of tongue   . Esophageal mass   . Heart murmur    Echo 2/09 revealing aortic sclerosis without gradient  . Hyperlipemia   . Hypertension   . Left shoulder pain   . Pancreatitis 07/30/2017   Lipase critically elevated, greater than 3x normal   Past Surgical History:  Procedure Laterality Date  . CHOLECYSTECTOMY    . COLONOSCOPY    . COLONOSCOPY WITH PROPOFOL N/A 05/05/2017   Procedure: COLONOSCOPY WITH PROPOFOL;  Surgeon: Toledo, Benay Pike, MD;  Location:  [facility] ENDOSCOPY;  Service: Gastroenterology;  Laterality: N/A;  . EUS N/A 12/22/2017   Procedure: FULL UPPER ENDOSCOPIC ULTRASOUND (EUS) RADIAL;  Surgeon: Arta Silence, MD;  Location: [facility] ENDOSCOPY;  Service: Endoscopy;  Laterality: N/A;  . schatzki's ring     s/p dilation 2/04  . TUBAL LIGATION     Social History   Socioeconomic History  . Marital status: Divorced    Spouse name: Not on file  . Number of children: Not on file  . Years of education: Not on file  . Highest education level: Not on file  Occupational History  . Not on file  Social Needs  . Financial resource strain: Not on file  . Food insecurity    Worry: Not on file    Inability: Not on file  . Transportation needs    Medical: Not on file    Non-medical: Not on file  Tobacco Use  . Smoking status: Never Smoker  . Smokeless tobacco: Current User  Types: Chew  . Tobacco comment: Chews 1 pack per day  Substance and Sexual Activity  . Alcohol use: Never    Frequency: Never  . Drug use: No  . Sexual activity: Not Currently  Lifestyle  . Physical activity    Days per week: Not on file    Minutes per session: Not on file  . Stress: Not on file  Relationships  . Social Musicianconnections    Talks on phone: Not on file    Gets together: Not on file    Attends religious service: Not on file    Active member of club or organization: Not on file    Attends meetings of clubs or organizations: Not on file    Relationship status: Not on file  Other Topics Concern  . Not on file  Social History Narrative  . Not on file   Outpatient Encounter Medications as of 08/05/2018  Medication Sig  . acetaminophen (TYLENOL) 500 MG tablet Take 1,000 mg by mouth every 6 (six) hours as needed for moderate pain or headache.  . Cholecalciferol (VITAMIN D3) 125 MCG (5000 UT) CAPS Take 1 capsule (5,000 Units total) by mouth daily.  Marland Kitchen. gabapentin (NEURONTIN) 600 MG tablet Take 1 tablet (600 mg total) by mouth 2 (two) times daily.  .  Insulin Glargine (LANTUS) 100 UNIT/ML Solostar Pen Inject 30 Units into the skin at bedtime.  Marland Kitchen. lisinopril (ZESTRIL) 10 MG tablet Take 1 tablet (10 mg total) by mouth daily.  Marland Kitchen. lovastatin (MEVACOR) 20 MG tablet Take 1 tablet (20 mg total) by mouth daily.  . Menthol, Topical Analgesic, (ABSORBINE JR BACK PATCH EX) Apply 1 patch topically daily.  . metFORMIN (GLUCOPHAGE) 1000 MG tablet TAKE 1 TABLET BY MOUTH TWICE DAILY WITH A MEAL  . Naproxen Sodium (ALEVE PO) Take by mouth.  . pantoprazole (PROTONIX) 40 MG tablet Take 1 tablet (40 mg total) by mouth 2 (two) times daily.  . Simethicone (GAS-X PO) Take by mouth.  . vitamin B-12 (CYANOCOBALAMIN) 500 MCG tablet Take 500 mcg by mouth daily.  . vitamin C (ASCORBIC ACID) 500 MG tablet Take 500 mg by mouth daily.   No facility-administered encounter medications on file as of 08/05/2018.     ALLERGIES: Allergies  Allergen Reactions  . Duloxetine Rash  . Sitagliptin Other (See Comments)    pancreatitis  . Dulaglutide Nausea And Vomiting and Nausea Only    Trulicity   . Hydrocodone Other (See Comments)    Edema   . Lyrica [Pregabalin] Other (See Comments)    Edema    VACCINATION STATUS:  There is no immunization history on file for this patient.  Diabetes She presents for her follow-up diabetic visit. She has type 2 diabetes mellitus. Onset time: She was diagnosed at approximate age of 68 years. Her disease course has been worsening. There are no hypoglycemic associated symptoms. Pertinent negatives for hypoglycemia include no confusion, headaches, pallor or seizures. Pertinent negatives for diabetes include no chest pain, no fatigue, no polydipsia and no polyphagia. There are no hypoglycemic complications. Symptoms are worsening. There are no diabetic complications. Risk factors for coronary artery disease include diabetes mellitus, dyslipidemia, family history, hypertension, obesity, sedentary lifestyle, post-menopausal and tobacco exposure.  Current diabetic treatment includes oral agent (dual therapy). Compliance with diabetes treatment: She is currently on metformin 500 mg p.o. twice daily, Invokana 300 mg p.o. daily. Her weight is fluctuating minimally. She is following a generally unhealthy diet. When asked about meal planning, she reported none. She  has not had a previous visit with a dietitian (Patient declined a consult.). She rarely participates in exercise. Her home blood glucose trend is increasing steadily. Her breakfast blood glucose range is generally 140-180 mg/dl. Her bedtime blood glucose range is generally 180-200 mg/dl. Her overall blood glucose range is 140-180 mg/dl. An ACE inhibitor/angiotensin II receptor blocker is being taken. Eye exam is not current.  Hyperlipidemia This is a chronic problem. The current episode started more than 1 year ago. The problem is controlled. Exacerbating diseases include diabetes and obesity. Pertinent negatives include no chest pain, myalgias or shortness of breath. Current antihyperlipidemic treatment includes statins. Risk factors for coronary artery disease include dyslipidemia, diabetes mellitus, hypertension, a sedentary lifestyle, post-menopausal and obesity.  Hypertension This is a chronic problem. The current episode started more than 1 year ago. The problem is controlled. Pertinent negatives include no chest pain, headaches, palpitations or shortness of breath. Risk factors for coronary artery disease include dyslipidemia, diabetes mellitus, obesity, sedentary lifestyle, smoking/tobacco exposure, family history and post-menopausal state. Past treatments include ACE inhibitors.    Review of Systems  Constitutional: Negative for chills, fatigue, fever and unexpected weight change.  HENT: Negative for trouble swallowing and voice change.   Eyes: Negative for visual disturbance.  Respiratory: Negative for cough, shortness of breath and wheezing.   Cardiovascular: Negative for chest  pain, palpitations and leg swelling.  Gastrointestinal: Negative for diarrhea, nausea and vomiting.  Endocrine: Negative for cold intolerance, heat intolerance, polydipsia and polyphagia.  Musculoskeletal: Negative for arthralgias and myalgias.  Skin: Negative for color change, pallor, rash and wound.  Neurological: Negative for seizures and headaches.  Psychiatric/Behavioral: Negative for confusion and suicidal ideas.    Objective:    There were no vitals taken for this visit.  Wt Readings from Last 3 Encounters:  06/21/18 192 lb 9.6 oz (87.4 kg)  03/29/18 180 lb (81.6 kg)  12/22/17 182 lb (82.6 kg)      CMP ( most recent) CMP     Component Value Date/Time   NA 142 08/01/2018 0947   NA 141 06/11/2017 1655   K 4.0 08/01/2018 0947   CL 105 08/01/2018 0947   CO2 30 08/01/2018 0947   GLUCOSE 166 (H) 08/01/2018 0947   BUN 16 08/01/2018 0947   BUN 16 06/11/2017 1655   CREATININE 0.89 08/01/2018 0947   CALCIUM 9.6 08/01/2018 0947   PROT 6.2 08/01/2018 0947   PROT 7.6 06/11/2017 1655   ALBUMIN 4.1 07/30/2017 2157   ALBUMIN 5.0 (H) 06/11/2017 1655   AST 9 (L) 08/01/2018 0947   ALT 9 08/01/2018 0947   ALKPHOS 105 07/30/2017 2157   BILITOT 0.3 08/01/2018 0947   BILITOT <0.2 06/11/2017 1655   GFRNONAA 68 08/01/2018 0947   GFRAA 78 08/01/2018 0947     Diabetic Labs (most recent): Lab Results  Component Value Date   HGBA1C 8.5 (H) 08/01/2018   HGBA1C 8.2 (H) 03/16/2018   HGBA1C 8.5 (H) 11/19/2017     Lipid Panel ( most recent) Lipid Panel     Component Value Date/Time   CHOL 148 11/19/2017 1009   TRIG 130 11/19/2017 1009   HDL 66 11/19/2017 1009   CHOLHDL 2.2 11/19/2017 1009   LDLCALC 61 11/19/2017 1009      Assessment & Plan:   1. Uncontrolled type 2 diabetes mellitus with hyperglycemia (HCC)  - Destiny Hurley has currently uncontrolled symptomatic type 2 DM since 68 years of age. -She returns with loss of control of diabetes  with A1c of 8.5%.  She  admits that she has not been consistent taking her medications including her insulin.  She did have multiple family members affected by COVID-19 including death of her brother-in-law.        Recent labs reviewed with her.  -She did not report gross complications from her diabetes, however Destiny Hurley remains at a high risk for more acute and chronic complications which include CAD, CVA, CKD, retinopathy, and neuropathy. These are all discussed in detail with the patient.  - I have counseled her on diet management and weight loss, by adopting a carbohydrate restricted/protein rich diet.  -She still admits to dietary discretion including consumption of sweetened beverages.  - she  admits there is a room for improvement in her diet and drink choices. -  Suggestion is made for her to avoid simple carbohydrates  from her diet including Cakes, Sweet Desserts / Pastries, Ice Cream, Soda (diet and regular), Sweet Tea, Candies, Chips, Cookies, Sweet Pastries,  Store Bought Juices, Alcohol in Excess of  1-2 drinks a day, Artificial Sweeteners, Coffee Creamer, and "Sugar-free" Products. This will help patient to have stable blood glucose profile and potentially avoid unintended weight gain.   - I encouraged her to switch to  unprocessed or minimally processed complex starch and increased protein intake (animal or plant source), fruits, and vegetables.  - she is advised to stick to a routine mealtimes to eat 3 meals  a day and avoid unnecessary snacks ( to snack only to correct hypoglycemia).   - I have approached her with the following individualized plan to manage diabetes and patient agrees:   -She promises that she will get back to routine of her diabetes care.  She is advised to resume Lantus 30 units nightly associated with strict monitoring of blood glucose 2 times a day-daily before breakfast and at bedtime.  She will also continue her metformin 1000 mg p.o. twice daily after breakfast and  after supper.   -Patient is encouraged to call clinic for blood glucose levels less than 70 or above 300 mg /dl.  - she is not a suitable candidate for incretin therapy given recent history of pancreatitis.    - Patient specific target  A1c;  LDL, HDL, Triglycerides, and  Waist Circumference were discussed in detail.  2) BP/HTN: she is advised to home monitor blood pressure and report if > 140/90 on 2 separate readings.  She is advised to continue her current blood pressure medications including lisinopril 10 mg daily.    3) Lipids/HPL:   She has hypertriglyceridemia-improving to 130, history of pancreatitis.  She is advised to continue lovastatin 20 mg p.o. nightly.     4)  Weight/Diet: CDE Consult is declined by the patient,  exercise, and detailed carbohydrates information provided.  5) Chronic Care/Health Maintenance:  -she  is on ACEI/ARB and Statin medications and  is encouraged to initiate and continue to follow up with Ophthalmology, Dentist,  Podiatrist at least yearly or according to recommendations, and advised to quit tobacco products. I have recommended yearly flu vaccine and pneumonia vaccine at least every 5 years; moderate intensity exercise for up to 150 minutes weekly; and  sleep for at least 7 hours a day.  - I advised patient to maintain close follow up with Avanell ShackletonHenson, Vickie L, NP-C for primary care needs.  - Patient Care Time Today:  25 min, of which >50% was spent in reviewing her  current and  previous labs/studies, previous treatments,  and medications doses and developing a plan for long-term care based on the latest recommendations for standards of care.  Destiny Hurley participated in the discussions, expressed understanding, and voiced agreement with the above plans.  All questions were answered to her satisfaction. she is encouraged to contact clinic should she have any questions or concerns prior to her return visit.   Follow up plan: - Return in about 3  months (around 11/05/2018) for Follow up with Pre-visit Labs, Meter, and Logs.  Marquis LunchGebre Anajulia Leyendecker, MD [facility] [address] [place], [place] [address] Phone: [phone]  Fax: [phone]    08/05/2018, 4:31 PM  This note was partially dictated with voice recognition software. Similar sounding words can be transcribed inadequately or may not  be corrected upon review.

## 2018-08-16 ENCOUNTER — Telehealth: Payer: Self-pay

## 2018-08-16 NOTE — Telephone Encounter (Signed)
Pt called last Friday to inform you of her most recent bp.   08/12/18 Lunch-133/89 08/11/18 Morning 161/101                  Lunch- 165/89     08/10/18 Morning-143/91               Lunch - 139/91              Dinner- 153/90

## 2018-08-21 NOTE — Telephone Encounter (Signed)
I recommend she bring in her BP machine for a nurse visit and have her BP checked here with our machine and with hers. Then we will know if her machine is accurate and be able to adjust medication accordingly.

## 2018-08-22 NOTE — Telephone Encounter (Signed)
Lab appointment has been scheduled 

## 2018-08-23 ENCOUNTER — Other Ambulatory Visit: Payer: Medicare HMO

## 2018-08-23 ENCOUNTER — Other Ambulatory Visit: Payer: Self-pay

## 2018-09-09 ENCOUNTER — Other Ambulatory Visit: Payer: Self-pay | Admitting: "Endocrinology

## 2018-09-09 DIAGNOSIS — E118 Type 2 diabetes mellitus with unspecified complications: Secondary | ICD-10-CM

## 2018-09-20 ENCOUNTER — Telehealth: Payer: Self-pay | Admitting: Family Medicine

## 2018-09-20 NOTE — Telephone Encounter (Signed)
PT called and states pain is no better and she wants a MRI.  She states pain going down her rt side into her stomach and into both feet/toes.  She states it is causing her bp and her sugar to be high.  Today's bp 188/109 with new machine.  Pt ph [phone]

## 2018-09-20 NOTE — Telephone Encounter (Signed)
She will need a visit.

## 2018-09-21 NOTE — Telephone Encounter (Signed)
Pt is being seen tomorrow

## 2018-09-22 ENCOUNTER — Ambulatory Visit (INDEPENDENT_AMBULATORY_CARE_PROVIDER_SITE_OTHER): Payer: Medicare HMO | Admitting: Family Medicine

## 2018-09-22 ENCOUNTER — Other Ambulatory Visit: Payer: Self-pay

## 2018-09-22 ENCOUNTER — Encounter: Payer: Self-pay | Admitting: Family Medicine

## 2018-09-22 VITALS — BP 150/90 | HR 86 | Temp 98.4°F | Wt 201.4 lb

## 2018-09-22 DIAGNOSIS — R1011 Right upper quadrant pain: Secondary | ICD-10-CM | POA: Diagnosis not present

## 2018-09-22 DIAGNOSIS — Z794 Long term (current) use of insulin: Secondary | ICD-10-CM

## 2018-09-22 DIAGNOSIS — G8929 Other chronic pain: Secondary | ICD-10-CM

## 2018-09-22 DIAGNOSIS — M25511 Pain in right shoulder: Secondary | ICD-10-CM | POA: Diagnosis not present

## 2018-09-22 DIAGNOSIS — G894 Chronic pain syndrome: Secondary | ICD-10-CM

## 2018-09-22 DIAGNOSIS — M25611 Stiffness of right shoulder, not elsewhere classified: Secondary | ICD-10-CM | POA: Diagnosis not present

## 2018-09-22 DIAGNOSIS — E1140 Type 2 diabetes mellitus with diabetic neuropathy, unspecified: Secondary | ICD-10-CM

## 2018-09-22 MED ORDER — DICLOFENAC SODIUM 1 % TD GEL: 2.0000 g | Freq: Four times a day (QID) | TRANSDERMAL | 1 refills | Status: AC

## 2018-09-22 MED ORDER — LISINOPRIL 20 MG PO TABS: 20.0000 mg | ORAL_TABLET | Freq: Every day | ORAL | 1 refills | Status: DC

## 2018-09-22 NOTE — Progress Notes (Signed)
Subjective:    Patient ID: Destiny Hurley, female    DOB: 1950-06-18, 68 y.o.   MRN: [id number]  HPI Chief Complaint  Patient presents with  . pain    pain under right breast, pain down right side, numbness down arm x 1 year   Complains of chronic RUQ abdominal pain that has improved somewhat over the past year. Pain is intermittent. Pain is not related to eating or movement.  States it is the same pain she had a CT for in July 2019.   She also complains of right shoulder pain that occasionally radiates down her right arm to her fingers. Also reports having decreased ROM. Denies injury. She has been taking OTC NSAIDs for this.   Bilateral foot pain that is chronic and daily. Pain is occasionally worse at night. Dr. Dorris Fetch is treating her with gabapentin for neuropathy.   History of fibroids and has seen OB/GYN for this per patient.   BP is elevated today and she is checking it at home and it is elevated there as well.  Reports taking lisinopril 10 mg everyday and no side effects.   Denies fever, chills, dizziness, chest pain, palpitations, shortness of breath, abdominal pain, N/V/D, urinary symptoms, LE edema.   Reviewed allergies, medications, past medical, surgical, family, and social history.    Review of Systems Pertinent positives and negatives in the history of present illness.     Objective:   Physical Exam Constitutional:      General: She is not in acute distress.    Appearance: Normal appearance. She is not ill-appearing.  Neck:     Musculoskeletal: Normal range of motion and neck supple.  Cardiovascular:     Rate and Rhythm: Normal rate and regular rhythm.     Pulses: Normal pulses.  Pulmonary:     Effort: Pulmonary effort is normal.     Breath sounds: Normal breath sounds.  Abdominal:     General: There is no distension.     Palpations: Abdomen is soft.  Musculoskeletal:     Right shoulder: She exhibits decreased range of motion and pain. She exhibits  no tenderness, no swelling, no crepitus, normal pulse and normal strength.     Comments: She has pain with flexion external rotation. No laxity.  Negative Neers and Hawkins. Negative drop arm.   Skin:    General: Skin is warm and dry.     Capillary Refill: Capillary refill takes less than 2 seconds.  Neurological:     General: No focal deficit present.     Mental Status: She is alert and oriented to person, place, and time.     Cranial Nerves: No cranial nerve deficit.     Sensory: No sensory deficit.     Gait: Gait normal.    BP (!) 150/90   Pulse 86   Temp 98.4 F (36.9 C)   Wt 201 lb 6.4 oz (91.4 kg)   BMI 33.51 kg/m         Assessment & Plan:  Chronic right shoulder pain - Plan: diclofenac sodium (VOLTAREN) 1 % GEL  Chronic pain syndrome  Decreased range of motion of right shoulder  Type 2 diabetes mellitus with diabetic neuropathy, with long-term current use of insulin (HCC)  Chronic RUQ pain  Her chief complaint today is pain. She has pain in multiple locations. Her pain is chronic and unchanged. Reviewed recent imaging with her and no findings to explain her symptoms. She has not had any imaging  of her right shoulder however.  Discussed that she has no neurological deficits. No red flag symptoms.  She will call and schedule with her orthopedist for shoulder pain. Offered to send her for an XR at [facility] but she declines and prefers to get this done at her orthopedist office.  She may try Voltaren gel for right shoulder pain in the meantime and OTC Tylenol.

## 2018-09-22 NOTE — Patient Instructions (Addendum)
Call and schedule with your orthopedist at [facility]  [phone] Address: [place], [place]  It is ok to take Tylenol as needed for your pain.   I am increasing your lisinopril dose to 20 mg once daily.   Continue to check your BP at home and keep a record of the readings.  Make sure you are eating a low sodium diet.   Goal BP reading is 130/80 or lower.  Follow up in 4 weeks for this.

## 2018-09-29 ENCOUNTER — Telehealth: Payer: Self-pay | Admitting: "Endocrinology

## 2018-09-29 NOTE — Telephone Encounter (Signed)
Pt left a VM that her feet are swelling and it is causing her pain. She called her PMD and they advised her to call Dr Dorris Fetch because he is her diabetic doctor

## 2018-09-29 NOTE — Telephone Encounter (Signed)
That is the right advise, she needs exam by PCP to see if she needs diuretics.

## 2018-09-29 NOTE — Telephone Encounter (Signed)
Called pt and she states that her PCP keeps sending her back to [place] for bilateral ankle/foot swelling, all over general pain, high BP readings. I advised her that she needed to contact them back because this is a primary care issue especially since her BG readings are between 111 - 120.

## 2018-10-01 ENCOUNTER — Other Ambulatory Visit: Payer: Self-pay

## 2018-10-01 ENCOUNTER — Encounter (HOSPITAL_COMMUNITY): Payer: Self-pay | Admitting: Emergency Medicine

## 2018-10-01 ENCOUNTER — Ambulatory Visit (HOSPITAL_COMMUNITY)
Admission: EM | Admit: 2018-10-01 | Discharge: 2018-10-01 | Disposition: A | Discharge status: Home / Self Care | Payer: Medicare HMO | Attending: Urgent Care | Admitting: Urgent Care

## 2018-10-01 DIAGNOSIS — S46912A Strain of unspecified muscle, fascia and tendon at shoulder and upper arm level, left arm, initial encounter: Secondary | ICD-10-CM | POA: Diagnosis not present

## 2018-10-01 MED ORDER — METHOCARBAMOL 500 MG PO TABS: 500.0000 mg | ORAL_TABLET | Freq: Two times a day (BID) | ORAL | 0 refills | Status: DC
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
## 2018-10-01 NOTE — Discharge Instructions (Addendum)
You may take 500mg  Tylenol every 6 hours for pain and inflammation. If Robaxin (methocarbamol) makes you sleepy as it is a muscle relaxant, then just take it at bedtime. Otherwise, you can use it up to twice daily as needed.
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
## 2018-10-18 ENCOUNTER — Other Ambulatory Visit: Payer: Self-pay | Admitting: Family Medicine

## 2018-10-24 ENCOUNTER — Other Ambulatory Visit: Payer: Self-pay

## 2018-10-24 ENCOUNTER — Encounter: Payer: Self-pay | Admitting: Family Medicine

## 2018-10-24 ENCOUNTER — Ambulatory Visit (INDEPENDENT_AMBULATORY_CARE_PROVIDER_SITE_OTHER): Payer: Medicare HMO | Admitting: Family Medicine

## 2018-10-24 VITALS — BP 148/110 | HR 78 | Wt 205.0 lb

## 2018-10-24 DIAGNOSIS — I10 Essential (primary) hypertension: Secondary | ICD-10-CM | POA: Diagnosis not present

## 2018-10-24 MED ORDER — AMLODIPINE BESYLATE 5 MG PO TABS: 5.0000 mg | ORAL_TABLET | Freq: Every day | ORAL | 0 refills | Status: DC

## 2018-10-24 MED ORDER — AMLODIPINE BESYLATE 5 MG PO TABS: 5.0000 mg | ORAL_TABLET | Freq: Every day | ORAL | 1 refills | Status: DC

## 2018-10-24 NOTE — Progress Notes (Signed)
   Subjective:  Documentation for telephone only visit. She does not access to video.   The patient was located at home. 2 patient identifiers used.  The provider was located in the office. The patient did consent to this visit and is aware of possible charges through their insurance for this visit.  The other persons participating in this telemedicine service were none.    Patient ID: Destiny Hurley, female    DOB: 09-28-1950, 68 y.o.   MRN: [id number]  HPI Chief Complaint  Patient presents with  . follow-up    follow-up on BP. running over 130/90-100's   She is a fairly new patient and we are following up today on HTN.   In September her BP was elevated so we increased her lisinopril from 10 mg to 20 mg. States she started this dose 2 weeks ago only due to her mail order pharmacy being slow.   States Dr. Caryl Comes, her previous PCP started her on lisinopril for her diabetes but she was not aware until recently that she had HTN.   Her diet is high in sodium states she cooks all of her vegetables in chicken broth. Has some swelling in her feet but this goes away daily.   Denies fever, chills, headache, dizziness, vision changes, chest pain, palpitations, shortness of breath, abdominal pain, N/V/D, urinary symptoms.  Denies numbness, tingling or weakness.   Diabetes is being managed by Dr. Dorris Fetch, endocrinologist. Has an appointment on the 21st of October.   Reviewed allergies, medications, past medical, surgical, family, and social history.   Review of Systems Pertinent positives and negatives in the history of present illness.     Objective:   Physical Exam BP (!) 148/110   Pulse 78   Wt 205 lb (93 kg)   BMI 34.11 kg/m   Alert and oriented and in no acute distress. Reports being in her usual health state. Speaking in complete sentences without difficulty.       Assessment & Plan:  Uncontrolled hypertension - Plan: amLODipine (NORVASC) 5 MG tablet  Discussed  potential health consequences related to uncontrolled HTN including heart attack and stroke.  No red flag symptoms. Reports being in her usual state of health.  She is currently asymptomatic.  She has been on lisinopril 20 mg for 2 weeks (increased from 10 mg at her previous visit) and her BP is not improving. I will add amlodipine to help control BP. Will need to watch for increased LE edema. Consider switching to diuretic if needed.  Counseling on low sodium diet. Use water instead of broth for cooking.  Recommend she come in to the office for 4 week HTN f/u or sooner if needed.    She is interested in flu/pneumonia study that [employer] is doing. I will have them contact her. She is aware that if she decides to not do the study that I am fine with that and she can come in here for her vaccines.   Time spent on call was 14 minutes and in review of previous records 4 minutes total.  This virtual service is not related to other E/M service within previous 7 days.

## 2018-11-07 DIAGNOSIS — E118 Type 2 diabetes mellitus with unspecified complications: Secondary | ICD-10-CM | POA: Diagnosis not present

## 2018-11-08 LAB — COMPLETE METABOLIC PANEL WITH GFR
AG Ratio: 1.6 (calc) (ref 1.0–2.5)
ALT: 11 U/L (ref 6–29)
AST: 10 U/L (ref 10–35)
Albumin: 4.1 g/dL (ref 3.6–5.1)
Alkaline phosphatase (APISO): 114 U/L (ref 37–153)
BUN: 13 mg/dL (ref 7–25)
CO2: 31 mmol/L (ref 20–32)
Calcium: 9.7 mg/dL (ref 8.6–10.4)
Chloride: 100 mmol/L (ref 98–110)
Creat: 0.81 mg/dL (ref 0.50–0.99)
GFR, Est African American: 88 mL/min/{1.73_m2} (ref >=60)
GFR, Est Non African American: 76 mL/min/{1.73_m2} (ref >=60)
Globulin: 2.5 g/dL (calc) (ref 1.9–3.7)
Glucose, Bld: 255 mg/dL — ABNORMAL HIGH (ref 65–99)
Potassium: 3.6 mmol/L (ref 3.5–5.3)
Sodium: 141 mmol/L (ref 135–146)
Total Bilirubin: 0.3 mg/dL (ref 0.2–1.2)
Total Protein: 6.6 g/dL (ref 6.1–8.1)

## 2018-11-08 LAB — HEMOGLOBIN A1C
Hgb A1c MFr Bld: 9.6 % of total Hgb — ABNORMAL HIGH (ref <5.7)
Mean Plasma Glucose: 229 (calc)
eAG (mmol/L): 12.7 (calc)

## 2018-11-09 ENCOUNTER — Ambulatory Visit (INDEPENDENT_AMBULATORY_CARE_PROVIDER_SITE_OTHER): Payer: Medicare HMO | Admitting: "Endocrinology

## 2018-11-09 ENCOUNTER — Other Ambulatory Visit: Payer: Self-pay

## 2018-11-09 ENCOUNTER — Encounter: Payer: Self-pay | Admitting: "Endocrinology

## 2018-11-09 DIAGNOSIS — E118 Type 2 diabetes mellitus with unspecified complications: Secondary | ICD-10-CM | POA: Diagnosis not present

## 2018-11-09 DIAGNOSIS — E782 Mixed hyperlipidemia: Secondary | ICD-10-CM

## 2018-11-09 DIAGNOSIS — I10 Essential (primary) hypertension: Secondary | ICD-10-CM | POA: Diagnosis not present

## 2018-11-09 MED ORDER — GLIPIZIDE ER 5 MG PO TB24: 5.0000 mg | ORAL_TABLET | Freq: Every day | ORAL | 3 refills | Status: DC

## 2018-11-09 MED ORDER — LANTUS SOLOSTAR 100 UNIT/ML ~~LOC~~ SOPN: 40.0000 [IU] | PEN_INJECTOR | Freq: Every day | SUBCUTANEOUS | 0 refills | Status: DC

## 2018-11-09 NOTE — Progress Notes (Signed)
11/09/2018, 1:00 PM                                                     Endocrinology Telehealth Visit Follow up Note -During COVID -19 Pandemic  This visit type was conducted due to national recommendations for restrictions regarding the COVID-19 Pandemic  in an effort to limit this patient's exposure and mitigate transmission of the corona virus.  Due to her co-morbid illnesses, Destiny Hurley is at  moderate to high risk for complications without adequate follow up.  This format is felt to be most appropriate for her at this time.  I connected with this patient on 11/09/2018   by telephone and verified that I am speaking with the correct person using two identifiers. Destiny Hurley, [birth year]. she has verbally consented to this visit. All issues noted in this document were discussed and addressed. The format was not optimal for physical exam.   Subjective:    Patient ID: Destiny Hurley, female    DOB: 1950/06/20.  Destiny Hurley is being engaged in telehealth in follow-up  For management of currently uncontrolled symptomatic currently uncontrolled symptomatic type 2 diabetes, hyperlipidemia, hypertension. PCP:  Avanell Shackleton, NP-C.   Past Medical History:  Diagnosis Date  . Adrenal mass (HCC) 2012   Destiny Hurley on CT  . Chronic painful diabetic neuropathy (HCC)   . Depression    situational  . Diabetes mellitus   . Dysplasia of tongue   . Esophageal mass   . Heart murmur    Echo 2/09 revealing aortic sclerosis without gradient  . Hyperlipemia   . Hypertension   . Left shoulder pain   . Pancreatitis 07/30/2017   Lipase critically elevated, greater than 3x normal   Past Surgical History:  Procedure Laterality Date  . CHOLECYSTECTOMY    . COLONOSCOPY    . COLONOSCOPY WITH PROPOFOL N/A 05/05/2017   Procedure: COLONOSCOPY WITH PROPOFOL;  Surgeon: Toledo, Boykin Nearing, MD;   Location: [facility] ENDOSCOPY;  Service: Gastroenterology;  Laterality: N/A;  . EUS N/A 12/22/2017   Procedure: FULL UPPER ENDOSCOPIC ULTRASOUND (EUS) RADIAL;  Surgeon: Willis Modena, MD;  Location: [facility]COPY;  Service: Endoscopy;  Laterality: N/A;  . schatzki's ring     s/p dilation 2/04  . TUBAL LIGATION     Social History   Socioeconomic History  . Marital status: Divorced    Spouse name: Not on file  . Number of children: Not on file  . Years of education: Not on file  . Highest education level: Not on file  Occupational History  . Not on file  Social Needs  . Financial resource strain: Not on file  . Food insecurity    Worry: Not on file    Inability: Not on file  . Transportation needs    Medical: Not on file    Non-medical: Not on file  Tobacco Use  . Smoking status: Never Smoker  . Smokeless tobacco: Current User  Types: Chew  . Tobacco comment: Chews 1 pack per day  Substance and Sexual Activity  . Alcohol use: Never    Frequency: Never  . Drug use: No  . Sexual activity: Not Currently  Lifestyle  . Physical activity    Days per week: Not on file    Minutes per session: Not on file  . Stress: Not on file  Relationships  . Social Musicianconnections    Talks on phone: Not on file    Gets together: Not on file    Attends religious service: Not on file    Active member of club or organization: Not on file    Attends meetings of clubs or organizations: Not on file    Relationship status: Not on file  Other Topics Concern  . Not on file  Social History Narrative  . Not on file   Outpatient Encounter Medications as of 11/09/2018  Medication Sig  . acetaminophen (TYLENOL) 500 MG tablet Take 1,000 mg by mouth every 6 (six) hours as needed for moderate pain or headache.  Marland Kitchen. amLODipine (NORVASC) 5 MG tablet Take 1 tablet (5 mg total) by mouth daily.  Marland Kitchen. amLODipine (NORVASC) 5 MG tablet Take 1 tablet (5 mg total) by mouth daily.  . Cholecalciferol (VITAMIN D3) 125 MCG  (5000 UT) CAPS Take 1 capsule (5,000 Units total) by mouth daily.  . diclofenac sodium (VOLTAREN) 1 % GEL Apply 2 g topically 4 (four) times daily.  Marland Kitchen. gabapentin (NEURONTIN) 600 MG tablet Take 1 tablet (600 mg total) by mouth 2 (two) times daily.  Marland Kitchen. glipiZIDE (GLUCOTROL XL) 5 MG 24 hr tablet Take 1 tablet (5 mg total) by mouth daily with breakfast.  . Insulin Glargine (LANTUS SOLOSTAR) 100 UNIT/ML Solostar Hurley Inject 40 Units into the skin at bedtime.  Marland Kitchen. lisinopril (ZESTRIL) 20 MG tablet Take 1 tablet (20 mg total) by mouth daily.  Marland Kitchen. lovastatin (MEVACOR) 20 MG tablet TAKE 1 TABLET EVERY DAY  . metFORMIN (GLUCOPHAGE) 1000 MG tablet TAKE 1 TABLET TWICE DAILY  . pantoprazole (PROTONIX) 40 MG tablet Take 1 tablet (40 mg total) by mouth 2 (two) times daily.  . Probiotic Product (ALIGN PO) Take by mouth.  . vitamin B-12 (CYANOCOBALAMIN) 500 MCG tablet Take 500 mcg by mouth daily.  . vitamin C (ASCORBIC ACID) 500 MG tablet Take 500 mg by mouth daily.  . [DISCONTINUED] LANTUS SOLOSTAR 100 UNIT/ML Solostar Hurley INJECT  30 UNITS SUBCUTANEOUSLY AT BEDTIME   No facility-administered encounter medications on file as of 11/09/2018.     ALLERGIES: Allergies  Allergen Reactions  . Duloxetine Rash  . Sitagliptin Other (See Comments)    pancreatitis  . Dulaglutide Nausea And Vomiting and Nausea Only    Trulicity   . Hydrocodone Other (See Comments)    Edema   . Lyrica [Pregabalin] Other (See Comments)    Edema    VACCINATION STATUS:  There is no immunization history on file for this patient.  Diabetes She presents for her follow-up diabetic visit. She has type 2 diabetes mellitus. Onset time: She was diagnosed at approximate age of 68 years. Her disease course has been worsening. There are no hypoglycemic associated symptoms. Pertinent negatives for hypoglycemia include no confusion, headaches, pallor or seizures. Pertinent negatives for diabetes include no chest pain, no fatigue, no polydipsia and  no polyphagia. There are no hypoglycemic complications. Symptoms are worsening. There are no diabetic complications. Risk factors for coronary artery disease include diabetes mellitus, dyslipidemia, family history, hypertension, obesity,  sedentary lifestyle, post-menopausal and tobacco exposure. Current diabetic treatment includes oral agent (dual therapy). Compliance with diabetes treatment: She is currently on metformin 500 mg p.o. twice daily, Invokana 300 mg p.o. daily. Her weight is fluctuating minimally. She is following a generally unhealthy diet. When asked about meal planning, she reported none. She has not had a previous visit with a dietitian (Patient declined a consult.). She rarely participates in exercise. Her home blood glucose trend is increasing steadily. Her breakfast blood glucose range is generally 180-200 mg/dl. Her bedtime blood glucose range is generally >200 mg/dl. Her overall blood glucose range is 180-200 mg/dl. An ACE inhibitor/angiotensin II receptor blocker is being taken. Eye exam is not current.  Hyperlipidemia This is a chronic problem. The current episode started more than 1 year ago. The problem is controlled. Exacerbating diseases include diabetes and obesity. Pertinent negatives include no chest pain, myalgias or shortness of breath. Current antihyperlipidemic treatment includes statins. Risk factors for coronary artery disease include dyslipidemia, diabetes mellitus, hypertension, a sedentary lifestyle, post-menopausal and obesity.  Hypertension This is a chronic problem. The current episode started more than 1 year ago. The problem is controlled. Pertinent negatives include no chest pain, headaches, palpitations or shortness of breath. Risk factors for coronary artery disease include dyslipidemia, diabetes mellitus, obesity, sedentary lifestyle, smoking/tobacco exposure, family history and post-menopausal state. Past treatments include ACE inhibitors.    Review of  systems: Limited as above.  Objective:    There were no vitals taken for this visit.  Wt Readings from Last 3 Encounters:  10/24/18 205 lb (93 kg)  09/22/18 201 lb 6.4 oz (91.4 kg)  06/21/18 192 lb 9.6 oz (87.4 kg)      CMP ( most recent) CMP     Component Value Date/Time   NA 141 11/07/2018 0818   NA 141 06/11/2017 1655   K 3.6 11/07/2018 0818   CL 100 11/07/2018 0818   CO2 31 11/07/2018 0818   GLUCOSE 255 (H) 11/07/2018 0818   BUN 13 11/07/2018 0818   BUN 16 06/11/2017 1655   CREATININE 0.81 11/07/2018 0818   CALCIUM 9.7 11/07/2018 0818   PROT 6.6 11/07/2018 0818   PROT 7.6 06/11/2017 1655   ALBUMIN 4.1 07/30/2017 2157   ALBUMIN 5.0 (H) 06/11/2017 1655   AST 10 11/07/2018 0818   ALT 11 11/07/2018 0818   ALKPHOS 105 07/30/2017 2157   BILITOT 0.3 11/07/2018 0818   BILITOT <0.2 06/11/2017 1655   GFRNONAA 76 11/07/2018 0818   GFRAA 88 11/07/2018 0818     Diabetic Labs (most recent): Lab Results  Component Value Date   HGBA1C 9.6 (H) 11/07/2018   HGBA1C 8.5 (H) 08/01/2018   HGBA1C 8.2 (H) 03/16/2018     Lipid Panel     Component Value Date/Time   CHOL 148 11/19/2017 1009   TRIG 130 11/19/2017 1009   HDL 66 11/19/2017 1009   CHOLHDL 2.2 11/19/2017 1009   LDLCALC 61 11/19/2017 1009      Assessment & Plan:   1. Uncontrolled type 2 diabetes mellitus with hyperglycemia (Destiny Hurley)  - Destiny Hurley has currently uncontrolled symptomatic type 2 DM since 68 years of age. -She reports loss of control of her diabetes, reports above target glycemic profile and A1c of 9.6% increasing from 8.5%.       Recent labs reviewed with her.  -She did not report gross complications from her diabetes, however Destiny Hurley remains at a high risk for more acute and chronic complications  which include CAD, CVA, CKD, retinopathy, and neuropathy. These are all discussed in detail with the patient.  - I have counseled her on diet management and weight loss, by adopting  a carbohydrate restricted/protein rich diet.  -She still admits to dietary discretion including consumption of sweetened beverages.  - she  admits there is a room for improvement in her diet and drink choices. -  Suggestion is made for her to avoid simple carbohydrates  from her diet including Cakes, Sweet Desserts / Pastries, Ice Cream, Soda (diet and regular), Sweet Tea, Candies, Chips, Cookies, Sweet Pastries,  Store Bought Juices, Alcohol in Excess of  1-2 drinks a day, Artificial Sweeteners, Coffee Creamer, and "Sugar-free" Products. This will help patient to have stable blood glucose profile and potentially avoid unintended weight gain.   - I encouraged her to switch to  unprocessed or minimally processed complex starch and increased protein intake (animal or plant source), fruits, and vegetables.  - she is advised to stick to a routine mealtimes to eat 3 meals  a day and avoid unnecessary snacks ( to snack only to correct hypoglycemia).   - I have approached her with the following individualized plan to manage diabetes and patient agrees:   -She promises that she will get back to routine of her diabetes care.  She is advised to increase her Lantus to 40 units nightly  associated with strict monitoring of blood glucose 2 times a day-daily before breakfast and at bedtime.  She will also continue her metformin 1000 mg p.o. twice daily after breakfast and after supper.   -I discussed and added low-dose glipizide 5 mg XL p.o. daily at breakfast. -Patient is encouraged to call clinic for blood glucose levels less than 70 or above 200 mg /dl.  - she is not a suitable candidate for incretin therapy given recent history of pancreatitis.    - Patient specific target  A1c;  LDL, HDL, Triglycerides, and  Waist Circumference were discussed in detail.  2) BP/HTN: she is advised to home monitor blood pressure and report if > 140/90 on 2 separate readings.   She is advised to continue her current  blood pressure medications including lisinopril 10 mg daily.    3) Lipids/HPL:   She has hypertriglyceridemia-improving to 130, history of pancreatitis.  She is advised to continue lovastatin 20 mg p.o. nightly.    4)  Weight/Diet: CDE Consult is declined by the patient,  exercise, and detailed carbohydrates information provided.  5) Chronic Care/Health Maintenance:  -she  is on ACEI/ARB and Statin medications and  is encouraged to initiate and continue to follow up with Ophthalmology, Dentist,  Podiatrist at least yearly or according to recommendations, and advised to quit tobacco products. I have recommended yearly flu vaccine and pneumonia vaccine at least every 5 years; moderate intensity exercise for up to 150 minutes weekly; and  sleep for at least 7 hours a day.  - I advised patient to maintain close follow up with Avanell Shackleton, NP-C for primary care needs.  - Patient Care Time Today:  25 min, of which >50% was spent in  counseling and the rest reviewing her  current and  previous labs/studies, previous treatments, her blood glucose readings, and medications' doses and developing a plan for long-term care based on the latest recommendations for standards of care.   Destiny Hurley participated in the discussions, expressed understanding, and voiced agreement with the above plans.  All questions were answered to her satisfaction. she  is encouraged to contact clinic should she have any questions or concerns prior to her return visit.   Follow up plan: - Return in about 2 weeks (around 11/23/2018) for Follow up with Meter and Logs Only - no Labs, Include 8 log sheets.  Marquis Lunch, MD [facility] [address] [place], [place] [address] Phone: [phone]  Fax: [phone]    11/09/2018, 1:00 PM  This note was partially dictated with voice recognition software. Similar sounding words can be transcribed inadequately or may  not  be corrected upon review.

## 2018-11-11 ENCOUNTER — Telehealth: Payer: Self-pay

## 2018-11-11 MED ORDER — FLUCONAZOLE 150 MG PO TABS: 150.0000 mg | ORAL_TABLET | Freq: Once | ORAL | 0 refills | Status: AC

## 2018-11-11 NOTE — Telephone Encounter (Signed)
Please advise 

## 2018-11-11 NOTE — Telephone Encounter (Signed)
Pt. Called wanting to know if Vickie could call her in somehting for a yeast infection started early this week, started a new med from her diabetic doctor, Glipizide, pt. Has itching and burning and has tried nothing OTC. Please advise.

## 2018-11-11 NOTE — Addendum Note (Signed)
Addended by: Minette Headland A on: 11/11/2018 03:50 PM   Modules accepted: Orders

## 2018-11-11 NOTE — Telephone Encounter (Signed)
Ok to send in diflucan 150 mg x 1 dose. Follow up if symptoms do not resolve.

## 2018-11-11 NOTE — Telephone Encounter (Signed)
Pt was notified and sent in med  

## 2018-11-16 ENCOUNTER — Other Ambulatory Visit: Payer: Self-pay | Admitting: "Endocrinology

## 2018-11-16 ENCOUNTER — Telehealth: Payer: Self-pay | Admitting: "Endocrinology

## 2018-11-16 MED ORDER — GABAPENTIN 300 MG PO CAPS: 300.0000 mg | ORAL_CAPSULE | Freq: Two times a day (BID) | ORAL | 0 refills | Status: DC

## 2018-11-16 NOTE — Telephone Encounter (Signed)
Yes that is ok with pt

## 2018-11-16 NOTE — Telephone Encounter (Signed)
I can only prescribe 300mg  , not 600mg . She has to let us know if that is Ok.

## 2018-11-16 NOTE — Telephone Encounter (Signed)
Pt would like to know could enough gabapentin (NEURONTIN) 600 MG tablet be called into her pharmacy, [facility] until her mail order comes which is about 7-10 days.

## 2018-11-21 ENCOUNTER — Other Ambulatory Visit: Payer: Self-pay

## 2018-11-21 ENCOUNTER — Ambulatory Visit (INDEPENDENT_AMBULATORY_CARE_PROVIDER_SITE_OTHER): Payer: Medicare HMO | Admitting: Family Medicine

## 2018-11-21 ENCOUNTER — Encounter: Payer: Self-pay | Admitting: Family Medicine

## 2018-11-21 VITALS — BP 140/80 | HR 87 | Temp 97.7°F | Wt 208.2 lb

## 2018-11-21 DIAGNOSIS — I10 Essential (primary) hypertension: Secondary | ICD-10-CM

## 2018-11-21 DIAGNOSIS — Z23 Encounter for immunization: Secondary | ICD-10-CM | POA: Diagnosis not present

## 2018-11-21 MED ORDER — AMLODIPINE BESYLATE 10 MG PO TABS: 10.0000 mg | ORAL_TABLET | Freq: Every day | ORAL | 1 refills | Status: DC

## 2018-11-21 NOTE — Progress Notes (Signed)
   Subjective:    Patient ID: Destiny Hurley, female    DOB: 03-07-1950, 68 y.o.   MRN: [id number]  HPI Chief Complaint  Patient presents with  . 4 week follow-up    4 week follow-up on bp. running 120-130s/80-90s   Here for a 4 week follow up on HTN.  States she has been taking 10 mg of amlodipine instead of 5 mg. She is also taking lisinopril 20 mg. No side effects.   She has but back on sodium. No longer cooking meals in broth.   Seeing Dr. Dorris Fetch for DM and has appt later this week. States he will do blood work.   No new concerns today.  Denies fever, chills, dizziness, chest pain, palpitations, shortness of breath, abdominal pain, N/V/D, urinary symptoms, LE edema.   Reviewed allergies, medications, past medical, surgical, family, and social history.    Review of Systems Pertinent positives and negatives in the history of present illness.     Objective:   Physical Exam BP 140/80   Pulse 87   Temp 97.7 F (36.5 C)   Wt 208 lb 3.2 oz (94.4 kg)   BMI 34.65 kg/m   Alert and oriented and in no acute distress.  Extremities without edema.  Skin is warm and dry.  Not otherwise examined.      Assessment & Plan:  Essential hypertension, benign  Needs flu shot - Plan: Flu Vaccine QUAD High Dose(Fluad)  Her blood pressure appears to be responding well to amlodipine 10 mg and she is not having any side effects.  She will continue on this dose of amlodipine as well as 20 mg of lisinopril.  Continue on low-sodium diet.  Continue monitoring her blood pressure at home. She will follow-up with Dr. Dorris Fetch, endocrinologist, later this week as scheduled. Recommend she return in the next couple of months for her initial Medicare wellness visit.  She appears to be deficient in preventive health and we will update this.

## 2018-11-21 NOTE — Patient Instructions (Addendum)
Continue on 10 mg of amlodipine once daily. You will need to take two of the 5 mg tablets until you run out of them.  I will send in a new prescription for the 10 mg tablets.   Continue with a low sodium diet.   Return for your Medicare Wellness/physical exam visit as discussed.

## 2018-11-24 ENCOUNTER — Ambulatory Visit (INDEPENDENT_AMBULATORY_CARE_PROVIDER_SITE_OTHER): Payer: Medicare HMO | Admitting: "Endocrinology

## 2018-11-24 ENCOUNTER — Encounter: Payer: Self-pay | Admitting: "Endocrinology

## 2018-11-24 ENCOUNTER — Other Ambulatory Visit: Payer: Self-pay

## 2018-11-24 DIAGNOSIS — E118 Type 2 diabetes mellitus with unspecified complications: Secondary | ICD-10-CM

## 2018-11-24 DIAGNOSIS — I10 Essential (primary) hypertension: Secondary | ICD-10-CM | POA: Diagnosis not present

## 2018-11-24 DIAGNOSIS — E782 Mixed hyperlipidemia: Secondary | ICD-10-CM | POA: Diagnosis not present

## 2018-11-24 NOTE — Progress Notes (Signed)
11/24/2018, 4:25 PM                                                     Endocrinology Telehealth Visit Follow up Note -During COVID -19 Pandemic  This visit type was conducted due to national recommendations for restrictions regarding the COVID-19 Pandemic  in an effort to limit this patient's exposure and mitigate transmission of the corona virus.  Due to her co-morbid illnesses, Destiny Hurley is at  moderate to high risk for complications without adequate follow up.  This format is felt to be most appropriate for her at this time.  I connected with this patient on 11/24/2018   by telephone and verified that I am speaking with the correct person using two identifiers. Destiny Hurley, 12/02/1950. she has verbally consented to this visit. All issues noted in this document were discussed and addressed. The format was not optimal for physical exam.   Subjective:    Patient ID: Destiny Hurley, female    DOB: 04/02/1950.  Destiny Hurley is being engaged in telehealth in follow-up  For management of currently uncontrolled symptomatic currently uncontrolled symptomatic type 2 diabetes, hyperlipidemia, hypertension. PCP:  Avanell ShackletonHenson, Vickie L, NP-C.   Past Medical History:  Diagnosis Date  . Adrenal mass (HCC) 2012   Abella pen on CT  . Chronic painful diabetic neuropathy (HCC)   . Depression    situational  . Diabetes mellitus   . Dysplasia of tongue   . Esophageal mass   . Heart murmur    Echo 2/09 revealing aortic sclerosis without gradient  . Hyperlipemia   . Hypertension   . Left shoulder pain   . Pancreatitis 07/30/2017   Lipase critically elevated, greater than 3x normal   Past Surgical History:  Procedure Laterality Date  . CHOLECYSTECTOMY    . COLONOSCOPY    . COLONOSCOPY WITH PROPOFOL N/A 05/05/2017   Procedure: COLONOSCOPY WITH PROPOFOL;  Surgeon: Toledo, Boykin Nearingeodoro K, MD;  Location:  [facility] ENDOSCOPY;  Service: Gastroenterology;  Laterality: N/A;  . EUS N/A 12/22/2017   Procedure: FULL UPPER ENDOSCOPIC ULTRASOUND (EUS) RADIAL;  Surgeon: Willis Modenautlaw, William, MD;  Location: [facility];  Service: Endoscopy;  Laterality: N/A;  . schatzki's ring     s/p dilation 2/04  . TUBAL LIGATION     Social History   Socioeconomic History  . Marital status: Divorced    Spouse name: Not on file  . Number of children: Not on file  . Years of education: Not on file  . Highest education level: Not on file  Occupational History  . Not on file  Social Needs  . Financial resource strain: Not on file  . Food insecurity    Worry: Not on file    Inability: Not on file  . Transportation needs    Medical: Not on file    Non-medical: Not on file  Tobacco Use  . Smoking status: Never Smoker  . Smokeless tobacco: Current User  Types: Chew  . Tobacco comment: Chews 1 pack per day  Substance and Sexual Activity  . Alcohol use: Never    Frequency: Never  . Drug use: No  . Sexual activity: Not Currently  Lifestyle  . Physical activity    Days per week: Not on file    Minutes per session: Not on file  . Stress: Not on file  Relationships  . Social Musician on phone: Not on file    Gets together: Not on file    Attends religious service: Not on file    Active member of club or organization: Not on file    Attends meetings of clubs or organizations: Not on file    Relationship status: Not on file  Other Topics Concern  . Not on file  Social History Narrative  . Not on file   Outpatient Encounter Medications as of 11/24/2018  Medication Sig  . acetaminophen (TYLENOL) 500 MG tablet Take 1,000 mg by mouth every 6 (six) hours as needed for moderate pain or headache.  Marland Kitchen amLODipine (NORVASC) 10 MG tablet Take 1 tablet (10 mg total) by mouth daily.  . Cholecalciferol (VITAMIN D3) 125 MCG (5000 UT) CAPS Take 1 capsule (5,000 Units total) by mouth daily.  . diclofenac sodium  (VOLTAREN) 1 % GEL Apply 2 g topically 4 (four) times daily.  Marland Kitchen gabapentin (NEURONTIN) 300 MG capsule Take 1 capsule (300 mg total) by mouth 2 (two) times daily.  Marland Kitchen glipiZIDE (GLUCOTROL XL) 5 MG 24 hr tablet Take 1 tablet (5 mg total) by mouth daily with breakfast.  . Insulin Glargine (LANTUS SOLOSTAR) 100 UNIT/ML Solostar Pen Inject 40 Units into the skin at bedtime.  Marland Kitchen lisinopril (ZESTRIL) 20 MG tablet Take 1 tablet (20 mg total) by mouth daily.  Marland Kitchen lovastatin (MEVACOR) 20 MG tablet TAKE 1 TABLET EVERY DAY  . metFORMIN (GLUCOPHAGE) 1000 MG tablet TAKE 1 TABLET TWICE DAILY  . pantoprazole (PROTONIX) 40 MG tablet Take 1 tablet (40 mg total) by mouth 2 (two) times daily.  . Probiotic Product (ALIGN PO) Take by mouth.  . vitamin B-12 (CYANOCOBALAMIN) 500 MCG tablet Take 500 mcg by mouth daily.  . vitamin C (ASCORBIC ACID) 500 MG tablet Take 500 mg by mouth daily.   No facility-administered encounter medications on file as of 11/24/2018.     ALLERGIES: Allergies  Allergen Reactions  . Duloxetine Rash  . Sitagliptin Other (See Comments)    pancreatitis  . Dulaglutide Nausea And Vomiting and Nausea Only    Trulicity   . Hydrocodone Other (See Comments)    Edema   . Lyrica [Pregabalin] Other (See Comments)    Edema    VACCINATION STATUS: Immunization History  Administered Date(s) Administered  . Fluad Quad(high Dose 65+) 11/21/2018    Diabetes She presents for her follow-up diabetic visit. She has type 2 diabetes mellitus. Onset time: She was diagnosed at approximate age of 68 years. Her disease course has been improving. There are no hypoglycemic associated symptoms. Pertinent negatives for hypoglycemia include no confusion, headaches, pallor or seizures. Pertinent negatives for diabetes include no chest pain, no fatigue, no polydipsia and no polyphagia. There are no hypoglycemic complications. Symptoms are improving. There are no diabetic complications. Risk factors for coronary artery  disease include diabetes mellitus, dyslipidemia, family history, hypertension, obesity, sedentary lifestyle, post-menopausal and tobacco exposure. Current diabetic treatment includes oral agent (dual therapy). Compliance with diabetes treatment: She is currently on metformin 500 mg p.o. twice daily,  Invokana 300 mg p.o. daily. Her weight is fluctuating minimally. She is following a generally unhealthy diet. When asked about meal planning, she reported none. She has not had a previous visit with a dietitian (Patient declined a consult.). She rarely participates in exercise. Her home blood glucose trend is decreasing steadily. Her breakfast blood glucose range is generally 130-140 mg/dl. Her bedtime blood glucose range is generally 140-180 mg/dl. Her overall blood glucose range is 140-180 mg/dl. An ACE inhibitor/angiotensin II receptor blocker is being taken. Eye exam is not current.  Hyperlipidemia This is a chronic problem. The current episode started more than 1 year ago. The problem is controlled. Exacerbating diseases include diabetes and obesity. Pertinent negatives include no chest pain, myalgias or shortness of breath. Current antihyperlipidemic treatment includes statins. Risk factors for coronary artery disease include dyslipidemia, diabetes mellitus, hypertension, a sedentary lifestyle, post-menopausal and obesity.  Hypertension This is a chronic problem. The current episode started more than 1 year ago. The problem is controlled. Pertinent negatives include no chest pain, headaches, palpitations or shortness of breath. Risk factors for coronary artery disease include dyslipidemia, diabetes mellitus, obesity, sedentary lifestyle, smoking/tobacco exposure, family history and post-menopausal state. Past treatments include ACE inhibitors.    Review of systems: Limited as above.  Objective:    There were no vitals taken for this visit.  Wt Readings from Last 3 Encounters:  11/21/18 208 lb 3.2 oz  (94.4 kg)  10/24/18 205 lb (93 kg)  09/22/18 201 lb 6.4 oz (91.4 kg)      CMP ( most recent) CMP     Component Value Date/Time   NA 141 11/07/2018 0818   NA 141 06/11/2017 1655   K 3.6 11/07/2018 0818   CL 100 11/07/2018 0818   CO2 31 11/07/2018 0818   GLUCOSE 255 (H) 11/07/2018 0818   BUN 13 11/07/2018 0818   BUN 16 06/11/2017 1655   CREATININE 0.81 11/07/2018 0818   CALCIUM 9.7 11/07/2018 0818   PROT 6.6 11/07/2018 0818   PROT 7.6 06/11/2017 1655   ALBUMIN 4.1 07/30/2017 2157   ALBUMIN 5.0 (H) 06/11/2017 1655   AST 10 11/07/2018 0818   ALT 11 11/07/2018 0818   ALKPHOS 105 07/30/2017 2157   BILITOT 0.3 11/07/2018 0818   BILITOT <0.2 06/11/2017 1655   GFRNONAA 76 11/07/2018 0818   GFRAA 88 11/07/2018 0818     Diabetic Labs (most recent): Lab Results  Component Value Date   HGBA1C 9.6 (H) 11/07/2018   HGBA1C 8.5 (H) 08/01/2018   HGBA1C 8.2 (H) 03/16/2018     Lipid Panel     Component Value Date/Time   CHOL 148 11/19/2017 1009   TRIG 130 11/19/2017 1009   HDL 66 11/19/2017 1009   CHOLHDL 2.2 11/19/2017 1009   LDLCALC 61 11/19/2017 1009      Assessment & Plan:   1. Uncontrolled type 2 diabetes mellitus with hyperglycemia (HCC)  - Destiny Hurley has currently uncontrolled symptomatic type 2 DM since 68 years of age. -She reports loss of control of her diabetes, reports above target glycemic profile and A1c of 9.6% increasing from 8.5%.       Recent labs reviewed with her.  -She did not report gross complications from her diabetes, however Destiny Hurley remains at a high risk for more acute and chronic complications which include CAD, CVA, CKD, retinopathy, and neuropathy. These are all discussed in detail with the patient.  - I have counseled her on diet management and weight  loss, by adopting a carbohydrate restricted/protein rich diet.  -She still admits to dietary discretion including consumption of sweetened beverages.  - she  admits  there is a room for improvement in her diet and drink choices. -  Suggestion is made for her to avoid simple carbohydrates  from her diet including Cakes, Sweet Desserts / Pastries, Ice Cream, Soda (diet and regular), Sweet Tea, Candies, Chips, Cookies, Sweet Pastries,  Store Bought Juices, Alcohol in Excess of  1-2 drinks a day, Artificial Sweeteners, Coffee Creamer, and "Sugar-free" Products. This will help patient to have stable blood glucose profile and potentially avoid unintended weight gain.   - I encouraged her to switch to  unprocessed or minimally processed complex starch and increased protein intake (animal or plant source), fruits, and vegetables.  - she is advised to stick to a routine mealtimes to eat 3 meals  a day and avoid unnecessary snacks ( to snack only to correct hypoglycemia).   - I have approached her with the following individualized plan to manage diabetes and patient agrees:   -She has responded to her current treatment regimen with controlled glycemic profile both fasting and at bedtime.  -She is advised to continue Lantus 40 units nightly,   associated with strict monitoring of blood glucose 2 times a day-daily before breakfast and at bedtime.  She will also continue her metformin 1000 mg p.o. twice daily after breakfast and after supper.   -She has benefited from low-dose glipizide therapy.  She is advised to continue  glipizide 5 mg XL p.o. daily at breakfast. -Patient is encouraged to call clinic for blood glucose levels less than 70 or above 200 mg /dl.  - she is not a suitable candidate for incretin therapy given recent history of pancreatitis.    - Patient specific target  A1c;  LDL, HDL, Triglycerides, and  Waist Circumference were discussed in detail.  2) BP/HTN: she is advised to home monitor blood pressure and report if > 140/90 on 2 separate readings.   She is advised to continue her current blood pressure medications including lisinopril 10 mg daily.     3) Lipids/HPL:   She has hypertriglyceridemia-improving to 130, history of pancreatitis.  She is advised to continue lovastatin 20 mg p.o. nightly.    4)  Weight/Diet: CDE Consult is declined by the patient,  exercise, and detailed carbohydrates information provided.  5) Chronic Care/Health Maintenance:  -she  is on ACEI/ARB and Statin medications and  is encouraged to initiate and continue to follow up with Ophthalmology, Dentist,  Podiatrist at least yearly or according to recommendations, and advised to quit tobacco products. I have recommended yearly flu vaccine and pneumonia vaccine at least every 5 years; moderate intensity exercise for up to 150 minutes weekly; and  sleep for at least 7 hours a day.  - I advised patient to maintain close follow up with Girtha Rm, NP-C for primary care needs.  - Patient Care Time Today:  25 min, of which >50% was spent in  counseling and the rest reviewing her  current and  previous labs/studies, previous treatments, her blood glucose readings, and medications' doses and developing a plan for long-term care based on the latest recommendations for standards of care.   Elvera Bicker Ciaramitaro participated in the discussions, expressed understanding, and voiced agreement with the above plans.  All questions were answered to her satisfaction. she is encouraged to contact clinic should she have any questions or concerns prior to her return  visit.   Follow up plan: - Return in about 3 months (around 02/24/2019) for Bring Meter and Logs- A1c in Office, Include 8 log sheets.  Marquis Lunch, MD [facility] [address] [place], [place] [address] Phone: [phone]  Fax: [phone]    11/24/2018, 4:25 PM  This note was partially dictated with voice recognition software. Similar sounding words can be transcribed inadequately or may not  be corrected upon review.

## 2018-11-30 ENCOUNTER — Other Ambulatory Visit: Payer: Self-pay | Admitting: "Endocrinology

## 2018-11-30 ENCOUNTER — Other Ambulatory Visit: Payer: Self-pay | Admitting: Family Medicine

## 2018-11-30 DIAGNOSIS — R109 Unspecified abdominal pain: Secondary | ICD-10-CM

## 2018-11-30 DIAGNOSIS — E118 Type 2 diabetes mellitus with unspecified complications: Secondary | ICD-10-CM

## 2018-12-07 ENCOUNTER — Other Ambulatory Visit: Payer: Self-pay

## 2018-12-07 MED ORDER — LANTUS SOLOSTAR 100 UNIT/ML ~~LOC~~ SOPN: 40.0000 [IU] | PEN_INJECTOR | Freq: Every day | SUBCUTANEOUS | 0 refills | Status: DC

## 2018-12-14 ENCOUNTER — Other Ambulatory Visit: Payer: Self-pay

## 2018-12-14 MED ORDER — LANTUS SOLOSTAR 100 UNIT/ML ~~LOC~~ SOPN: 40.0000 [IU] | PEN_INJECTOR | Freq: Every day | SUBCUTANEOUS | 0 refills | Status: DC

## 2018-12-24 ENCOUNTER — Other Ambulatory Visit: Payer: Self-pay | Admitting: Family Medicine

## 2018-12-26 DIAGNOSIS — E113393 Type 2 diabetes mellitus with moderate nonproliferative diabetic retinopathy without macular edema, bilateral: Secondary | ICD-10-CM | POA: Diagnosis not present

## 2019-01-11 DIAGNOSIS — M25511 Pain in right shoulder: Secondary | ICD-10-CM | POA: Diagnosis not present

## 2019-01-11 DIAGNOSIS — Z6835 Body mass index (BMI) 35.0-35.9, adult: Secondary | ICD-10-CM | POA: Diagnosis not present

## 2019-01-11 DIAGNOSIS — Z0000 Encounter for general adult medical examination without abnormal findings: Secondary | ICD-10-CM | POA: Diagnosis not present

## 2019-01-11 DIAGNOSIS — E1169 Type 2 diabetes mellitus with other specified complication: Secondary | ICD-10-CM | POA: Diagnosis not present

## 2019-01-11 DIAGNOSIS — E669 Obesity, unspecified: Secondary | ICD-10-CM | POA: Diagnosis not present

## 2019-01-11 DIAGNOSIS — G8929 Other chronic pain: Secondary | ICD-10-CM | POA: Diagnosis not present

## 2019-01-11 DIAGNOSIS — M25611 Stiffness of right shoulder, not elsewhere classified: Secondary | ICD-10-CM | POA: Diagnosis not present

## 2019-01-11 DIAGNOSIS — Z008 Encounter for other general examination: Secondary | ICD-10-CM | POA: Diagnosis not present

## 2019-01-23 ENCOUNTER — Telehealth: Payer: Self-pay | Admitting: Family Medicine

## 2019-01-23 NOTE — Telephone Encounter (Signed)
Pt called and stated that she will be transferring out and wanted to let you know.

## 2019-01-26 ENCOUNTER — Ambulatory Visit: Payer: Medicare HMO | Admitting: Family Medicine

## 2019-01-30 ENCOUNTER — Encounter: Payer: Self-pay | Admitting: Physical Therapy

## 2019-01-30 ENCOUNTER — Other Ambulatory Visit: Payer: Self-pay

## 2019-01-30 ENCOUNTER — Ambulatory Visit: Payer: Medicare HMO | Admitting: Physical Therapy

## 2019-01-30 ENCOUNTER — Other Ambulatory Visit: Payer: Self-pay | Admitting: "Endocrinology

## 2019-01-30 VITALS — BP 99/54 | HR 99

## 2019-01-30 DIAGNOSIS — M25611 Stiffness of right shoulder, not elsewhere classified: Secondary | ICD-10-CM

## 2019-01-30 DIAGNOSIS — G8929 Other chronic pain: Secondary | ICD-10-CM | POA: Diagnosis not present

## 2019-01-30 DIAGNOSIS — M25511 Pain in right shoulder: Secondary | ICD-10-CM

## 2019-01-30 DIAGNOSIS — R293 Abnormal posture: Secondary | ICD-10-CM

## 2019-01-30 NOTE — Patient Instructions (Signed)
Access Code: [email or web address]  URL: [email or web address]  Date: 01/30/2019  Prepared by: Moshe Cipro   Exercises Seated Shoulder Flexion Self PROM - 10 reps - 1 sets - 2x daily - 7x weekly Seated Shoulder Abduction AAROM with Dowel - 10 reps - 1 sets - 2x daily - 7x weekly Standing Shoulder Internal Rotation Stretch with Towel - 10 reps - 1 sets - 10 sec hold - 2x daily - 7x weekly Patient Education TENS Unit

## 2019-01-30 NOTE — Therapy (Signed)
[facility] [address] [place], [place], [address] Phone: [phone]   Fax:  [phone]  Physical Therapy Evaluation  Patient Details  Name: Destiny Hurley MRN: [id number] Date of Birth: [birth year] Referring Provider (PT): Dr. Apolonio Schneiders   Encounter Date: 01/30/2019  PT End of Session - 01/30/19 1436    Visit Number  1    Number of Visits  12    Date for PT Re-Evaluation  03/13/19    PT Start Time  4010    PT Stop Time  1443    PT Time Calculation (min)  45 min    Activity Tolerance  Patient tolerated treatment well    Behavior During Therapy  [facility] for tasks assessed/performed       Past Medical History:  Diagnosis Date  . Adrenal mass ([place]) 2012   [name] on CT  . Chronic painful diabetic neuropathy ([place])   . Depression    situational  . Diabetes mellitus   . Dysplasia of tongue   . Esophageal mass   . Heart murmur    Echo 2/09 revealing aortic sclerosis without gradient  . Hyperlipemia   . Hypertension   . Left shoulder pain   . Pancreatitis 07/30/2017   Lipase critically elevated, greater than 3x normal    Past Surgical History:  Procedure Laterality Date  . CHOLECYSTECTOMY    . COLONOSCOPY    . COLONOSCOPY WITH PROPOFOL N/A 05/05/2017   Procedure: COLONOSCOPY WITH PROPOFOL;  Surgeon: Toledo, Benay Pike, MD;  Location: [facility] ENDOSCOPY;  Service: Gastroenterology;  Laterality: N/A;  . EUS N/A 12/22/2017   Procedure: FULL UPPER ENDOSCOPIC ULTRASOUND (EUS) RADIAL;  Surgeon: Arta Silence, MD;  Location: [facility];  Service: Endoscopy;  Laterality: N/A;  . schatzki's ring     s/p dilation 2/04  . TUBAL LIGATION      Vitals:   01/30/19 1400  BP: (!) 99/54  Pulse: 99     Subjective Assessment - 01/30/19 1400    Subjective  Pt is a 69 y/o female who presents to OPPT for Rt shoulder pain.  Pt initially reporting that she is here for pain in her legs (Rt worse than Lt), but then expressed concern of pain in all  of Lt side.  Pt reports Rt shoulder pain x 1.5 years without known injury.  Pt reports difficulty with sleeping on her side.    Limitations  Lifting    Patient Stated Goals  be able to squat and get back up, "if I fall get back up," sleep on Rt side    Currently in Pain?  Yes    Pain Score  5    up to 10/10; at best 5/10   Pain Location  Shoulder    Pain Orientation  Right    Pain Descriptors / Indicators  Sharp;Shooting;Aching    Pain Type  Chronic pain    Pain Radiating Towards  into RUE/fingers    Pain Onset  More than a month ago    Pain Frequency  Constant    Aggravating Factors   sleeping on Rt side, trying to lift arm    Pain Relieving Factors  rest, immobilization         [facility] PT Assessment - 01/30/19 1407      Assessment   Medical Diagnosis  Rt shoulder pain    Referring Provider (PT)  Dr. Apolonio Schneiders    Onset Date/Surgical Date  --   May 2019   Hand Dominance  Right    Next MD Visit  02/11/19    Prior Therapy  when pain initially started      Precautions   Precautions  Fall      Restrictions   Weight Bearing Restrictions  No      Balance Screen   Has the patient fallen in the past 6 months  Yes    How many times?  1    Has the patient had a decrease in activity level because of a fear of falling?   Yes    Is the patient reluctant to leave their home because of a fear of falling?   Yes      Home Environment   Living Environment  Private residence    Living Arrangements  Children   daughter and son-in-law   Additional Comments  I wit ADLs      Prior Function   Level of Independence  Independent    Vocation  Retired    Gaffer  retired from [employer]    Leisure  sedentary lifestyle (bed to [employer])      Cognition   Overall Cognitive Status  Within Functional Limits for tasks assessed      Posture/Postural Control   Posture/Postural Control  Postural limitations    Postural Limitations  Rounded Shoulders;Forward head      ROM /  Strength   AROM / PROM / Strength  AROM;Strength;PROM      AROM   AROM Assessment Site  Shoulder    Right/Left Shoulder  Right    Right Shoulder Flexion  120 Degrees    Right Shoulder ABduction  116 Degrees    Right Shoulder Internal Rotation  --   FIR to Rt iliac crest   Right Shoulder External Rotation  --   FER to back of head with pain     PROM   PROM Assessment Site  Shoulder    Right/Left Shoulder  Right    Right Shoulder Flexion  141 Degrees    Right Shoulder ABduction  143 Degrees      Strength   Strength Assessment Site  Shoulder    Right/Left Shoulder  Right    Right Shoulder Flexion  4/5    Right Shoulder ABduction  4-/5    Right Shoulder Internal Rotation  4/5    Right Shoulder External Rotation  4/5                Objective measurements completed on examination: See above findings.      [facility] Adult PT Treatment/Exercise - 01/30/19 1407      Exercises   Exercises  Shoulder      Shoulder Exercises: Seated   Flexion  AAROM;Right;5 reps    Abduction  AAROM;Right;5 reps      Shoulder Exercises: Stretch   Internal Rotation Stretch  2 reps    Internal Rotation Stretch Limitations  10 sec; Rt - cues for technique      Modalities   Modalities  Moist Heat;Electrical Stimulation      Moist Heat Therapy   Number Minutes Moist Heat  10 Minutes    Moist Heat Location  Shoulder      Electrical Stimulation   Electrical Stimulation Location  Rt shoulder    Electrical Stimulation Action  IFC    Electrical Stimulation Parameters  to tolerance    Electrical Stimulation Goals  Pain             PT Education - 01/30/19 1436  Education Details  HEP, TENS    Person(s) Educated  Patient    Methods  Explanation;Demonstration;Handout    Comprehension  Verbalized understanding;Returned demonstration;Need further instruction          PT Long Term Goals - 01/30/19 1440      PT LONG TERM GOAL #1   Title  independent with HEP    Status  New     Target Date  03/13/19      PT LONG TERM GOAL #2   Title  report pain < 6/10 with activity for improved function    Status  New    Target Date  03/13/19      PT LONG TERM GOAL #3   Title  improve functional IR to at least L1 for improved ADLs/decreased pain with toileting    Status  New    Target Date  03/13/19      PT LONG TERM GOAL #4   Title  imrpove Rt shoulder flexion/abduction by at least 15 degrees for improved function    Status  New    Target Date  03/13/19             Plan - 01/30/19 1437    Clinical Impression Statement  Pt is a 69 y/o female who presents to OPPT for Rt shoulder pain.  Pt also c/o bil LE pain and difficulty with balance, however these issues are outside scope of referral and pt would need new referral to address.  Pt demonstrates decreased ROM with mild strength limitations, poor postural awareness and pain affecting function of Rt shoulder.  Pt will benefit from PT to address defictis listed.    Personal Factors and Comorbidities  Age;Comorbidity 3+;Past/Current Experience    Comorbidities  DM with neuropathy, obesity, depression, HTN    Examination-Activity Limitations  Lift;Reach Overhead;Carry;Sleep;Hygiene/Grooming;Dressing    Examination-Participation Restrictions  Driving    Stability/Clinical Decision Making  Evolving/Moderate complexity    Clinical Decision Making  Moderate    Rehab Potential  Good    PT Frequency  2x / week    PT Duration  6 weeks    PT Treatment/Interventions  ADLs/Self Care Home Management;Cryotherapy;Electrical Stimulation;Ultrasound;Moist Heat;Iontophoresis 4mg /ml Dexamethasone;Functional mobility training;Therapeutic activities;Therapeutic exercise;Patient/family education;Neuromuscular re-education;Manual techniques;Taping;Dry needling;Passive range of motion    PT Next Visit Plan  review HEP, continue with ROM exercises, posture re-ed, manual/modalities PRN    PT Home Exercise Plan  Access Code: [email or web address]        Patient will benefit from skilled therapeutic intervention in order to improve the following deficits and impairments:  Increased fascial restricitons, Increased muscle spasms, Pain, Postural dysfunction, Decreased range of motion, Decreased strength, Impaired UE functional use  Visit Diagnosis: Chronic right shoulder pain - Plan: PT plan of care cert/re-cert  Stiffness of right shoulder, not elsewhere classified - Plan: PT plan of care cert/re-cert  Abnormal posture - Plan: PT plan of care cert/re-cert     Problem List Patient Active Problem List   Diagnosis Date Noted  . Chronic right shoulder pain 09/22/2018  . Breast pain, right 06/21/2018  . Type 2 diabetes mellitus with complication, without long-term current use of insulin (HCC) 03/29/2018  . Vitamin D deficiency 11/25/2017  . Essential hypertension, benign 09/22/2017  . Mixed hyperlipidemia 09/22/2017  . Abdominal pain 08/04/2017  . Elevated lipase 08/04/2017  . Uncontrolled type 2 diabetes mellitus with hyperglycemia (HCC) 08/04/2017  . Heart murmur 08/04/2017  . Schatzki's ring 08/04/2017  . Swelling of right foot 07/08/2017  . Dysuria 07/08/2017  .  Left thigh pain 06/11/2017  . Proximal limb muscle weakness 04/01/2017  . Type 2 diabetes mellitus with diabetic neuropathy (HCC) 04/01/2017  . Chronic diarrhea 04/01/2017  . Lumbar disc disease 03/17/2017  . Chronic painful diabetic neuropathy (HCC) 06/20/2014      Clarita Crane, PT, DPT 01/30/19 2:45 PM     [facility] [address] [place], [place], [address] Phone: [phone]   Fax:  [phone]  Name: Maryhelen Lindler MRN: [id number] Date of Birth: 03-31-1950

## 2019-02-02 ENCOUNTER — Other Ambulatory Visit: Payer: Self-pay | Admitting: Family Medicine

## 2019-02-07 ENCOUNTER — Other Ambulatory Visit: Payer: Self-pay | Admitting: "Endocrinology

## 2019-02-07 DIAGNOSIS — E118 Type 2 diabetes mellitus with unspecified complications: Secondary | ICD-10-CM

## 2019-02-13 ENCOUNTER — Encounter: Payer: Self-pay | Admitting: "Endocrinology

## 2019-02-15 ENCOUNTER — Other Ambulatory Visit: Payer: Self-pay

## 2019-02-15 ENCOUNTER — Ambulatory Visit: Payer: Medicare HMO | Admitting: Physical Therapy

## 2019-02-15 DIAGNOSIS — M25511 Pain in right shoulder: Secondary | ICD-10-CM | POA: Diagnosis not present

## 2019-02-15 DIAGNOSIS — R293 Abnormal posture: Secondary | ICD-10-CM

## 2019-02-15 DIAGNOSIS — M25611 Stiffness of right shoulder, not elsewhere classified: Secondary | ICD-10-CM | POA: Diagnosis not present

## 2019-02-15 DIAGNOSIS — G8929 Other chronic pain: Secondary | ICD-10-CM

## 2019-02-15 NOTE — Therapy (Signed)
[facility] [address] [place], [place], [address] Phone: [phone]   Fax:  [phone]  Physical Therapy Treatment  Patient Details  Name: Destiny Hurley MRN: [id number] Date of Birth: 12/08/1950 Referring Provider (PT): Dr. Alonna Buckler   Encounter Date: 02/15/2019  PT End of Session - 02/15/19 1152    Visit Number  2    Number of Visits  12    Date for PT Re-Evaluation  03/13/19    PT Start Time  1100    PT Stop Time  1147    PT Time Calculation (min)  47 min    Activity Tolerance  Patient tolerated treatment well    Behavior During Therapy  [facility] for tasks assessed/performed       Past Medical History:  Diagnosis Date  . Adrenal mass (HCC) 2012   [place] pen on CT  . Chronic painful diabetic neuropathy (HCC)   . Depression    situational  . Diabetes mellitus   . Dysplasia of tongue   . Esophageal mass   . Heart murmur    Echo 2/09 revealing aortic sclerosis without gradient  . Hyperlipemia   . Hypertension   . Left shoulder pain   . Pancreatitis 07/30/2017   Lipase critically elevated, greater than 3x normal    Past Surgical History:  Procedure Laterality Date  . CHOLECYSTECTOMY    . COLONOSCOPY    . COLONOSCOPY WITH PROPOFOL N/A 05/05/2017   Procedure: COLONOSCOPY WITH PROPOFOL;  Surgeon: Toledo, Boykin Nearing, MD;  Location: [facility] ENDOSCOPY;  Service: Gastroenterology;  Laterality: N/A;  . EUS N/A 12/22/2017   Procedure: FULL UPPER ENDOSCOPIC ULTRASOUND (EUS) RADIAL;  Surgeon: Willis Modena, MD;  Location: [facility];  Service: Endoscopy;  Laterality: N/A;  . schatzki's ring     s/p dilation 2/04  . TUBAL LIGATION      There were no vitals filed for this visit.  Subjective Assessment - 02/15/19 1120    Subjective  Relays my arm is getting better with the exercises. I dont have any pain right now but will if I reach too far certain ways         [facility] PT Assessment - 02/15/19 0001      Assessment   Medical  Diagnosis  Rt shoulder pain    Referring Provider (PT)  Dr. Alonna Buckler      AROM   Right Shoulder Flexion  152 Degrees    Right Shoulder ABduction  150 Degrees    Right Shoulder Internal Rotation  --   illiac crest   Right Shoulder External Rotation  --   WNL     PROM   Overall PROM Comments  WNL except IR, did not measure but see AROM above      Strength   Right Shoulder Flexion  4+/5    Right Shoulder ABduction  4+/5    Right Shoulder Internal Rotation  5/5    Right Shoulder External Rotation  4+/5                   [facility] Adult PT Treatment/Exercise - 02/15/19 0001      Shoulder Exercises: Pulleys   Flexion  2 minutes    ABduction  2 minutes      Shoulder Exercises: ROM/Strengthening   UBE (Upper Arm Bike)  2 min fwd/2 min retro    [place]  X 10 reps flexion, scaption, circles    Other ROM/Strengthening Exercises  wall ladder X 5 reps flexion,  X5 reps scaption      Shoulder Exercises: Stretch   Internal Rotation Stretch  10 seconds    Internal Rotation Stretch Limitations  10 reps behind back with strap      Moist Heat Therapy   Number Minutes Moist Heat  10 Minutes    Moist Heat Location  Shoulder      Electrical Stimulation   Electrical Stimulation Location  Rt shoulder    Electrical Stimulation Action  IRF    Electrical Stimulation Parameters  tolerance    Electrical Stimulation Goals  Pain      Manual Therapy   Manual therapy comments  PROM Rt shoulder all planes but focused more time on IR stretching, then gentle [place] mobs inferior and A-P, P-A grade 2-3                  PT Long Term Goals - 01/30/19 1440      PT LONG TERM GOAL #1   Title  independent with HEP    Status  New    Target Date  03/13/19      PT LONG TERM GOAL #2   Title  report pain < 6/10 with activity for improved function    Status  New    Target Date  03/13/19      PT LONG TERM GOAL #3   Title  improve functional IR to at least L1 for improved ADLs/decreased  pain with toileting    Status  New    Target Date  03/13/19      PT LONG TERM GOAL #4   Title  imrpove Rt shoulder flexion/abduction by at least 15 degrees for improved function    Status  New    Target Date  03/13/19            Plan - 02/15/19 1152    Clinical Impression Statement  Shis is progressing well with PT and has made big improements with her Rt shoulder ROM and strength. She does still lack IR ROM and manual therapy focused on improving IR ROM. PT will continue to progress as able toward her functional goals.    Personal Factors and Comorbidities  Age;Comorbidity 3+;Past/Current Experience    Comorbidities  DM with neuropathy, obesity, depression, HTN    Examination-Activity Limitations  Lift;Reach Overhead;Carry;Sleep;Hygiene/Grooming;Dressing    Examination-Participation Restrictions  Driving    Stability/Clinical Decision Making  Evolving/Moderate complexity    Rehab Potential  Good    PT Frequency  2x / week    PT Duration  6 weeks    PT Treatment/Interventions  ADLs/Self Care Home Management;Cryotherapy;Electrical Stimulation;Ultrasound;Moist Heat;Iontophoresis 4mg /ml Dexamethasone;Functional mobility training;Therapeutic activities;Therapeutic exercise;Patient/family education;Neuromuscular re-education;Manual techniques;Taping;Dry needling;Passive range of motion    PT Next Visit Plan  update HEP to include more strenghtening, continue with ROM exercises focusing more on IR, posture re-ed, manual/modalities PRN    PT Home Exercise Plan  Access Code: [email or web address]       Patient will benefit from skilled therapeutic intervention in order to improve the following deficits and impairments:  Increased fascial restricitons, Increased muscle spasms, Pain, Postural dysfunction, Decreased range of motion, Decreased strength, Impaired UE functional use  Visit Diagnosis: Chronic right shoulder pain  Stiffness of right shoulder, not elsewhere classified  Abnormal  posture     Problem List Patient Active Problem List   Diagnosis Date Noted  . Chronic right shoulder pain 09/22/2018  . Breast pain, right 06/21/2018  . Type 2 diabetes mellitus with complication, without long-term current use  of insulin (HCC) 03/29/2018  . Vitamin D deficiency 11/25/2017  . Essential hypertension, benign 09/22/2017  . Mixed hyperlipidemia 09/22/2017  . Abdominal pain 08/04/2017  . Elevated lipase 08/04/2017  . Uncontrolled type 2 diabetes mellitus with hyperglycemia (HCC) 08/04/2017  . Heart murmur 08/04/2017  . Schatzki's ring 08/04/2017  . Swelling of right foot 07/08/2017  . Dysuria 07/08/2017  . Left thigh pain 06/11/2017  . Proximal limb muscle weakness 04/01/2017  . Type 2 diabetes mellitus with diabetic neuropathy (HCC) 04/01/2017  . Chronic diarrhea 04/01/2017  . Lumbar disc disease 03/17/2017  . Chronic painful diabetic neuropathy (HCC) 06/20/2014    Birdie Riddle 02/15/2019, 12:01 PM  [facility] [address] [place], [place], [address] Phone: [phone]   Fax:  [phone]  Name: Elajah Kunsman MRN: [id number] Date of Birth: 1950-06-25

## 2019-02-17 ENCOUNTER — Other Ambulatory Visit: Payer: Self-pay

## 2019-02-17 ENCOUNTER — Encounter: Payer: Self-pay | Admitting: Physical Therapy

## 2019-02-17 ENCOUNTER — Ambulatory Visit: Payer: Medicare HMO | Admitting: Physical Therapy

## 2019-02-17 DIAGNOSIS — M25511 Pain in right shoulder: Secondary | ICD-10-CM | POA: Diagnosis not present

## 2019-02-17 DIAGNOSIS — G8929 Other chronic pain: Secondary | ICD-10-CM | POA: Diagnosis not present

## 2019-02-17 DIAGNOSIS — M25611 Stiffness of right shoulder, not elsewhere classified: Secondary | ICD-10-CM | POA: Diagnosis not present

## 2019-02-17 DIAGNOSIS — R293 Abnormal posture: Secondary | ICD-10-CM | POA: Diagnosis not present

## 2019-02-17 MED ORDER — GLIPIZIDE ER 5 MG PO TB24: 5.0000 mg | ORAL_TABLET | Freq: Every day | ORAL | 0 refills | Status: DC

## 2019-02-17 NOTE — Therapy (Signed)
[facility] [address] [place], [place], [address] Phone: [phone]   Fax:  [phone]  Physical Therapy Treatment  Patient Details  Name: Destiny Hurley MRN: [id number] Date of Birth: 1950/02/25 Referring Provider (PT): Dr. Alonna Buckler   Encounter Date: 02/17/2019  PT End of Session - 02/17/19 1136    Visit Number  3    Number of Visits  12    Date for PT Re-Evaluation  03/13/19    PT Start Time  1052    PT Stop Time  1141    PT Time Calculation (min)  49 min    Activity Tolerance  Patient tolerated treatment well    Behavior During Therapy  [facility] for tasks assessed/performed       Past Medical History:  Diagnosis Date  . Adrenal mass (HCC) 2012   Elianys pen on CT  . Chronic painful diabetic neuropathy (HCC)   . Depression    situational  . Diabetes mellitus   . Dysplasia of tongue   . Esophageal mass   . Heart murmur    Echo 2/09 revealing aortic sclerosis without gradient  . Hyperlipemia   . Hypertension   . Left shoulder pain   . Pancreatitis 07/30/2017   Lipase critically elevated, greater than 3x normal    Past Surgical History:  Procedure Laterality Date  . CHOLECYSTECTOMY    . COLONOSCOPY    . COLONOSCOPY WITH PROPOFOL N/A 05/05/2017   Procedure: COLONOSCOPY WITH PROPOFOL;  Surgeon: Toledo, Boykin Nearing, MD;  Location: [facility] ENDOSCOPY;  Service: Gastroenterology;  Laterality: N/A;  . EUS N/A 12/22/2017   Procedure: FULL UPPER ENDOSCOPIC ULTRASOUND (EUS) RADIAL;  Surgeon: Willis Modena, MD;  Location: [facility];  Service: Endoscopy;  Laterality: N/A;  . schatzki's ring     s/p dilation 2/04  . TUBAL LIGATION      There were no vitals filed for this visit.  Subjective Assessment - 02/17/19 1051    Subjective  "it's hurting bad today but it's getting better."  relays still having pain with exercises, but motion overall improving.    Patient Stated Goals  be able to squat and get back up, "if I fall get back  up," sleep on Rt side    Currently in Pain?  Yes    Pain Score  2     Pain Location  Shoulder    Pain Orientation  Right    Pain Descriptors / Indicators  Sharp;Shooting;Aching    Pain Type  Chronic pain    Pain Onset  More than a month ago    Pain Frequency  Intermittent    Aggravating Factors   sleeping on Rt side, trying to lift arm    Pain Relieving Factors  rest, immobilization                       [facility] Adult PT Treatment/Exercise - 02/17/19 1056      Shoulder Exercises: Supine   Flexion  Both;15 reps   3# bar     Shoulder Exercises: Pulleys   Flexion  2 minutes    ABduction  2 minutes      Shoulder Exercises: ROM/Strengthening   UBE (Upper Arm Bike)  2 min fwd/2 min retro; L2.0    Ranger  X 10 reps flexion, scaption, circles      Shoulder Exercises: Stretch   Internal Rotation Stretch  10 seconds    Internal Rotation Stretch Limitations  10 reps behind back  with strap      Moist Heat Therapy   Number Minutes Moist Heat  10 Minutes    Moist Heat Location  Shoulder      Electrical Stimulation   Electrical Stimulation Location  Rt shoulder    Electrical Stimulation Action  IFC    Electrical Stimulation Parameters  to tolerance    Electrical Stimulation Goals  Pain      Manual Therapy   Manual therapy comments  PROM Rt shoulder all planes but focused more time on IR stretching, then gentle [place] mobs inferior and A-P, P-A grade 2-3                  PT Long Term Goals - 01/30/19 1440      PT LONG TERM GOAL #1   Title  independent with HEP    Status  New    Target Date  03/13/19      PT LONG TERM GOAL #2   Title  report pain < 6/10 with activity for improved function    Status  New    Target Date  03/13/19      PT LONG TERM GOAL #3   Title  improve functional IR to at least L1 for improved ADLs/decreased pain with toileting    Status  New    Target Date  03/13/19      PT LONG TERM GOAL #4   Title  imrpove Rt shoulder  flexion/abduction by at least 15 degrees for improved function    Status  New    Target Date  03/13/19            Plan - 02/17/19 1136    Clinical Impression Statement  Pt reports ROM is improving overall and pain still elevated at end range, but short-lived.  Pt will continue to benefit from PT to maximize function.    Personal Factors and Comorbidities  Age;Comorbidity 3+;Past/Current Experience    Comorbidities  DM with neuropathy, obesity, depression, HTN    Examination-Activity Limitations  Lift;Reach Overhead;Carry;Sleep;Hygiene/Grooming;Dressing    Examination-Participation Restrictions  Driving    Stability/Clinical Decision Making  Evolving/Moderate complexity    Rehab Potential  Good    PT Frequency  2x / week    PT Duration  6 weeks    PT Treatment/Interventions  ADLs/Self Care Home Management;Cryotherapy;Electrical Stimulation;Ultrasound;Moist Heat;Iontophoresis 4mg /ml Dexamethasone;Functional mobility training;Therapeutic activities;Therapeutic exercise;Patient/family education;Neuromuscular re-education;Manual techniques;Taping;Dry needling;Passive range of motion    PT Next Visit Plan  update HEP to include more strengthening, continue with ROM exercises focusing more on IR, posture re-ed, manual/modalities PRN    PT Home Exercise Plan  Access Code: [email or web address]       Patient will benefit from skilled therapeutic intervention in order to improve the following deficits and impairments:  Increased fascial restricitons, Increased muscle spasms, Pain, Postural dysfunction, Decreased range of motion, Decreased strength, Impaired UE functional use  Visit Diagnosis: Chronic right shoulder pain  Stiffness of right shoulder, not elsewhere classified  Abnormal posture     Problem List Patient Active Problem List   Diagnosis Date Noted  . Chronic right shoulder pain 09/22/2018  . Breast pain, right 06/21/2018  . Type 2 diabetes mellitus with complication, without long-term  current use of insulin ([place]) 03/29/2018  . Vitamin D deficiency 11/25/2017  . Essential hypertension, benign 09/22/2017  . Mixed hyperlipidemia 09/22/2017  . Abdominal pain 08/04/2017  . Elevated lipase 08/04/2017  . Uncontrolled type 2 diabetes mellitus with hyperglycemia ([place]) 08/04/2017  . Heart murmur 08/04/2017  .  Schatzki's ring 08/04/2017  . Swelling of right foot 07/08/2017  . Dysuria 07/08/2017  . Left thigh pain 06/11/2017  . Proximal limb muscle weakness 04/01/2017  . Type 2 diabetes mellitus with diabetic neuropathy (HCC) 04/01/2017  . Chronic diarrhea 04/01/2017  . Lumbar disc disease 03/17/2017  . Chronic painful diabetic neuropathy (HCC) 06/20/2014      Clarita Crane, PT, DPT 02/17/19 11:39 AM     [facility] [address] [place], [place], [address] Phone: [phone]   Fax:  [phone]  Name: Destiny Hurley MRN: [id number] Date of Birth: 04-02-1950

## 2019-02-22 ENCOUNTER — Other Ambulatory Visit: Payer: Self-pay

## 2019-02-22 ENCOUNTER — Encounter: Payer: Self-pay | Admitting: Physical Therapy

## 2019-02-22 ENCOUNTER — Ambulatory Visit (INDEPENDENT_AMBULATORY_CARE_PROVIDER_SITE_OTHER): Payer: Medicare HMO | Admitting: Physical Therapy

## 2019-02-22 DIAGNOSIS — G8929 Other chronic pain: Secondary | ICD-10-CM | POA: Diagnosis not present

## 2019-02-22 DIAGNOSIS — M25511 Pain in right shoulder: Secondary | ICD-10-CM | POA: Diagnosis not present

## 2019-02-22 DIAGNOSIS — R293 Abnormal posture: Secondary | ICD-10-CM | POA: Diagnosis not present

## 2019-02-22 DIAGNOSIS — M25611 Stiffness of right shoulder, not elsewhere classified: Secondary | ICD-10-CM | POA: Diagnosis not present

## 2019-02-22 NOTE — Therapy (Signed)
[facility] [address] [place], [place], [address] Phone: [phone]   Fax:  [phone]  Physical Therapy Treatment  Patient Details  Name: Destiny Hurley MRN: [id number] Date of Birth: 05-Sep-1950 Referring Provider (PT): Dr. Alonna Buckler   Encounter Date: 02/22/2019  PT End of Session - 02/22/19 1428    Visit Number  4    Number of Visits  12    Date for PT Re-Evaluation  03/13/19    PT Start Time  1318    PT Stop Time  1358    PT Time Calculation (min)  40 min    Activity Tolerance  Patient tolerated treatment well    Behavior During Therapy  [facility] for tasks assessed/performed       Past Medical History:  Diagnosis Date  . Adrenal mass (HCC) 2012   Deanda pen on CT  . Chronic painful diabetic neuropathy (HCC)   . Depression    situational  . Diabetes mellitus   . Dysplasia of tongue   . Esophageal mass   . Heart murmur    Echo 2/09 revealing aortic sclerosis without gradient  . Hyperlipemia   . Hypertension   . Left shoulder pain   . Pancreatitis 07/30/2017   Lipase critically elevated, greater than 3x normal    Past Surgical History:  Procedure Laterality Date  . CHOLECYSTECTOMY    . COLONOSCOPY    . COLONOSCOPY WITH PROPOFOL N/A 05/05/2017   Procedure: COLONOSCOPY WITH PROPOFOL;  Surgeon: Toledo, Boykin Nearing, MD;  Location: [facility] END[facility]COPY;  Service: Gastroenterology;  Laterality: N/A;  . EUS N/A 12/22/2017   Procedure: FULL UPPER ENDOSCOPIC ULTRASOUND (EUS) RADIAL;  Surgeon: Willis Modena, MD;  Location: [facility];  Service: Endoscopy;  Laterality: N/A;  . schatzki's ring     s/p dilation 2/04  . TUBAL LIGATION      There were no vitals filed for this visit.  Subjective Assessment - 02/22/19 1320    Subjective  shoulder is getting better.  still pain with reaching behind back. brought her TENS unit and wants [place] to help set it up    Patient Stated Goals  be able to squat and get back up, "if I fall get back up,"  sleep on Rt side    Currently in Pain?  No/denies    Pain Onset  More than a month ago                       [facility] Adult PT Treatment/Exercise - 02/22/19 1322      Self-Care   Self-Care  Other Self-Care Comments    Other Self-Care Comments   instructed in use of home TENS unit for home-pt verbalized understanding      Shoulder Exercises: Standing   Internal Rotation  Right;AAROM;10 reps   1# bar   Extension  AAROM;10 reps   1# bar     Shoulder Exercises: ROM/Strengthening   UBE (Upper Arm Bike)  2 min fwd/2 min retro; L2.0      Shoulder Exercises: Stretch   Internal Rotation Stretch  10 seconds    Internal Rotation Stretch Limitations  10 reps behind back with strap    Other Shoulder Stretches  mid level doorway stretch 3x30 sec      Electrical Stimulation   Electrical Stimulation Location  Rt shoulder    Electrical Stimulation Action  TENS    Electrical Stimulation Parameters  to tolerance with exercise    Electrical Stimulation Goals  Pain             PT Education - 02/22/19 1428    Education Details  see self care    Person(s) Educated  Patient    Methods  Explanation;Demonstration    Comprehension  Verbalized understanding;Returned demonstration          PT Long Term Goals - 01/30/19 1440      PT LONG TERM GOAL #1   Title  independent with HEP    Status  New    Target Date  03/13/19      PT LONG TERM GOAL #2   Title  report pain < 6/10 with activity for improved function    Status  New    Target Date  03/13/19      PT LONG TERM GOAL #3   Title  improve functional IR to at least L1 for improved ADLs/decreased pain with toileting    Status  New    Target Date  03/13/19      PT LONG TERM GOAL #4   Title  imrpove Rt shoulder flexion/abduction by at least 15 degrees for improved function    Status  New    Target Date  03/13/19            Plan - 02/22/19 1429    Clinical Impression Statement  Pt with improvement in pain  with flexion and abduction, still with persistent pain with functional internal rotation.  Reports pain better with TENS during exercise.    Personal Factors and Comorbidities  Age;Comorbidity 3+;Past/Current Experience    Comorbidities  DM with neuropathy, obesity, depression, HTN    Examination-Activity Limitations  Lift;Reach Overhead;Carry;Sleep;Hygiene/Grooming;Dressing    Examination-Participation Restrictions  Driving    Stability/Clinical Decision Making  Evolving/Moderate complexity    Rehab Potential  Good    PT Frequency  2x / week    PT Duration  6 weeks    PT Treatment/Interventions  ADLs/Self Care Home Management;Cryotherapy;Electrical Stimulation;Ultrasound;Moist Heat;Iontophoresis 4mg /ml Dexamethasone;Functional mobility training;Therapeutic activities;Therapeutic exercise;Patient/family education;Neuromuscular re-education;Manual techniques;Taping;Dry needling;Passive range of motion    PT Next Visit Plan  update HEP to include more strengthening, continue with ROM exercises focusing more on IR, posture re-ed, manual/modalities PRN    PT Home Exercise Plan  Access Code: [email or web address]       Patient will benefit from skilled therapeutic intervention in order to improve the following deficits and impairments:  Increased fascial restricitons, Increased muscle spasms, Pain, Postural dysfunction, Decreased range of motion, Decreased strength, Impaired UE functional use  Visit Diagnosis: Chronic right shoulder pain  Stiffness of right shoulder, not elsewhere classified  Abnormal posture     Problem List Patient Active Problem List   Diagnosis Date Noted  . Chronic right shoulder pain 09/22/2018  . Breast pain, right 06/21/2018  . Type 2 diabetes mellitus with complication, without long-term current use of insulin (HCC) 03/29/2018  . Vitamin D deficiency 11/25/2017  . Essential hypertension, benign 09/22/2017  . Mixed hyperlipidemia 09/22/2017  . Abdominal pain 08/04/2017  .  Elevated lipase 08/04/2017  . Uncontrolled type 2 diabetes mellitus with hyperglycemia (HCC) 08/04/2017  . Heart murmur 08/04/2017  . Schatzki's ring 08/04/2017  . Swelling of right foot 07/08/2017  . Dysuria 07/08/2017  . Left thigh pain 06/11/2017  . Proximal limb muscle weakness 04/01/2017  . Type 2 diabetes mellitus with diabetic neuropathy (HCC) 04/01/2017  . Chronic diarrhea 04/01/2017  . Lumbar disc disease 03/17/2017  . Chronic painful diabetic neuropathy (HCC) 06/20/2014  Laureen Abrahams, PT, DPT 02/22/19 2:31 PM     [facility] [address] [place], [place], [address] Phone: [phone]   Fax:  [phone]  Name: Destiny Hurley MRN: [id number] Date of Birth: Nov 18, 1950

## 2019-02-24 ENCOUNTER — Encounter: Payer: Medicare HMO | Admitting: Physical Therapy

## 2019-02-24 ENCOUNTER — Ambulatory Visit: Payer: Medicare HMO | Admitting: Physical Therapy

## 2019-02-24 ENCOUNTER — Encounter: Payer: Self-pay | Admitting: "Endocrinology

## 2019-02-24 ENCOUNTER — Other Ambulatory Visit: Payer: Self-pay

## 2019-02-24 ENCOUNTER — Ambulatory Visit (INDEPENDENT_AMBULATORY_CARE_PROVIDER_SITE_OTHER): Payer: Medicare HMO | Admitting: "Endocrinology

## 2019-02-24 DIAGNOSIS — I10 Essential (primary) hypertension: Secondary | ICD-10-CM | POA: Diagnosis not present

## 2019-02-24 DIAGNOSIS — E118 Type 2 diabetes mellitus with unspecified complications: Secondary | ICD-10-CM

## 2019-02-24 DIAGNOSIS — M25511 Pain in right shoulder: Secondary | ICD-10-CM

## 2019-02-24 DIAGNOSIS — E782 Mixed hyperlipidemia: Secondary | ICD-10-CM | POA: Diagnosis not present

## 2019-02-24 DIAGNOSIS — M25611 Stiffness of right shoulder, not elsewhere classified: Secondary | ICD-10-CM | POA: Diagnosis not present

## 2019-02-24 DIAGNOSIS — G8929 Other chronic pain: Secondary | ICD-10-CM | POA: Diagnosis not present

## 2019-02-24 DIAGNOSIS — R293 Abnormal posture: Secondary | ICD-10-CM | POA: Diagnosis not present

## 2019-02-24 NOTE — Patient Instructions (Signed)

## 2019-02-24 NOTE — Therapy (Signed)
[facility] [address] [place], [place], [address] Phone: [phone]   Fax:  [phone]  Physical Therapy Treatment  Patient Details  Name: Destiny Hurley MRN: [id number] Date of Birth: 24-Nov-1950 Referring Provider (PT): Dr. Apolonio Schneiders   Encounter Date: 02/24/2019  PT End of Session - 02/24/19 1619    Visit Number  5    Number of Visits  12    Date for PT Re-Evaluation  03/13/19    PT Start Time  9528    PT Stop Time  1610    PT Time Calculation (min)  40 min    Activity Tolerance  Patient tolerated treatment well    Behavior During Therapy  [facility] for tasks assessed/performed       Past Medical History:  Diagnosis Date  . Adrenal mass ([place]) 2012   Megann pen on CT  . Chronic painful diabetic neuropathy ([place])   . Depression    situational  . Diabetes mellitus   . Dysplasia of tongue   . Esophageal mass   . Heart murmur    Echo 2/09 revealing aortic sclerosis without gradient  . Hyperlipemia   . Hypertension   . Left shoulder pain   . Pancreatitis 07/30/2017   Lipase critically elevated, greater than 3x normal    Past Surgical History:  Procedure Laterality Date  . CHOLECYSTECTOMY    . COLONOSCOPY    . COLONOSCOPY WITH PROPOFOL N/A 05/05/2017   Procedure: COLONOSCOPY WITH PROPOFOL;  Surgeon: Toledo, Benay Pike, MD;  Location: [facility] ENDOSCOPY;  Service: Gastroenterology;  Laterality: N/A;  . EUS N/A 12/22/2017   Procedure: FULL UPPER ENDOSCOPIC ULTRASOUND (EUS) RADIAL;  Surgeon: Arta Silence, MD;  Location: [facility] ENDOSCOPY;  Service: Endoscopy;  Laterality: N/A;  . schatzki's ring     s/p dilation 2/04  . TUBAL LIGATION      There were no vitals filed for this visit.  Subjective Assessment - 02/24/19 1538    Subjective  relays her Right shoulder is 8/10 pain today.    Limitations  Lifting    Patient Stated Goals  be able to squat and get back up, "if I fall get back up," sleep on Rt side    Pain Onset  More than a  month ago                       [facility] Adult PT Treatment/Exercise - 02/24/19 0001      Shoulder Exercises: Supine   Flexion  Right;15 reps    Shoulder Flexion Weight (lbs)  2    Other Supine Exercises  IR/ER with 2 lb ball X 15 reps ea in 90 deg flexion and 70 deg abd      Shoulder Exercises: Standing   Extension  Both;15 reps    Theraband Level (Shoulder Extension)  Level 2 (Red)    Row  Both;20 reps    Theraband Level (Shoulder Row)  Level 2 (Red)      Shoulder Exercises: Pulleys   Flexion  2 minutes    ABduction  2 minutes      Moist Heat Therapy   Number Minutes Moist Heat  15 Minutes    Moist Heat Location  Shoulder      Electrical Stimulation   Electrical Stimulation Location  Rt shoulder    Electrical Stimulation Action  IFC    Electrical Stimulation Parameters  tolerance with heat    Electrical Stimulation Goals  Pain  Manual Therapy   Manual therapy comments  IASTM to Rt shoulder, PROM, GH mobs                  PT Long Term Goals - 01/30/19 1440      PT LONG TERM GOAL #1   Title  independent with HEP    Status  New    Target Date  03/13/19      PT LONG TERM GOAL #2   Title  report pain < 6/10 with activity for improved function    Status  New    Target Date  03/13/19      PT LONG TERM GOAL #3   Title  improve functional IR to at least L1 for improved ADLs/decreased pain with toileting    Status  New    Target Date  03/13/19      PT LONG TERM GOAL #4   Title  imrpove Rt shoulder flexion/abduction by at least 15 degrees for improved function    Status  New    Target Date  03/13/19            Plan - 02/24/19 1619    Clinical Impression Statement  had severe pain coming in today so backed off of exercises and focused more on pain relief with heat,TENS, and manual therapy. She responded great to this and had less pain/guarding after thus was able to peform light strengthening with good toleance. Continue POC     Personal Factors and Comorbidities  Age;Comorbidity 3+;Past/Current Experience    Comorbidities  DM with neuropathy, obesity, depression, HTN    Examination-Activity Limitations  Lift;Reach Overhead;Carry;Sleep;Hygiene/Grooming;Dressing    Examination-Participation Restrictions  Driving    Stability/Clinical Decision Making  Evolving/Moderate complexity    Rehab Potential  Good    PT Frequency  2x / week    PT Duration  6 weeks    PT Treatment/Interventions  ADLs/Self Care Home Management;Cryotherapy;Electrical Stimulation;Ultrasound;Moist Heat;Iontophoresis 4mg /ml Dexamethasone;Functional mobility training;Therapeutic activities;Therapeutic exercise;Patient/family education;Neuromuscular re-education;Manual techniques;Taping;Dry needling;Passive range of motion    PT Next Visit Plan  update HEP to include more strengthening, continue with ROM exercises focusing more on IR, posture re-ed, manual/modalities PRN    PT Home Exercise Plan  Access Code: [email or web address]       Patient will benefit from skilled therapeutic intervention in order to improve the following deficits and impairments:  Increased fascial restricitons, Increased muscle spasms, Pain, Postural dysfunction, Decreased range of motion, Decreased strength, Impaired UE functional use  Visit Diagnosis: Chronic right shoulder pain  Stiffness of right shoulder, not elsewhere classified  Abnormal posture     Problem List Patient Active Problem List   Diagnosis Date Noted  . Chronic right shoulder pain 09/22/2018  . Breast pain, right 06/21/2018  . Type 2 diabetes mellitus with complication, without long-term current use of insulin (HCC) 03/29/2018  . Vitamin D deficiency 11/25/2017  . Essential hypertension, benign 09/22/2017  . Mixed hyperlipidemia 09/22/2017  . Abdominal pain 08/04/2017  . Elevated lipase 08/04/2017  . Uncontrolled type 2 diabetes mellitus with hyperglycemia (HCC) 08/04/2017  . Heart murmur 08/04/2017  .  Schatzki's ring 08/04/2017  . Swelling of right foot 07/08/2017  . Dysuria 07/08/2017  . Left thigh pain 06/11/2017  . Proximal limb muscle weakness 04/01/2017  . Type 2 diabetes mellitus with diabetic neuropathy (HCC) 04/01/2017  . Chronic diarrhea 04/01/2017  . Lumbar disc disease 03/17/2017  . Chronic painful diabetic neuropathy (HCC) 06/20/2014    08/20/2014 02/24/2019, 4:21 PM  [facility] [address] [place], [place], [address] Phone: [phone]   Fax:  [phone]  Name: Iveth Heidemann MRN: [id number] Date of Birth: 01-17-1951

## 2019-02-24 NOTE — Progress Notes (Signed)
02/24/2019, 12:05 PM                                                     Endocrinology Telehealth Visit Follow up Note -During COVID -19 Pandemic  This visit type was conducted due to national recommendations for restrictions regarding the COVID-19 Pandemic  in an effort to limit this patient's exposure and mitigate transmission of the corona virus.  Due to her co-morbid illnesses, Destiny Hurley is at  moderate to high risk for complications without adequate follow up.  This format is felt to be most appropriate for her at this time.  I connected with this patient on 02/24/2019   by telephone and verified that I am speaking with the correct person using two identifiers. Destiny Hurley, 06-09-1950. she has verbally consented to this visit. All issues noted in this document were discussed and addressed. The format was not optimal for physical exam.   Subjective:    Patient ID: [name], female    DOB: 09/01/1950.  Destiny Hurley is being engaged in telehealth in follow-up  For management of currently uncontrolled symptomatic currently uncontrolled symptomatic type 2 diabetes, hyperlipidemia, hypertension. PCP:  Girtha Rm, NP-C.   Past Medical History:  Diagnosis Date  . Adrenal mass ([place]) 2012   [place] pen on CT  . Chronic painful diabetic neuropathy ([place])   . Depression    situational  . Diabetes mellitus   . Dysplasia of tongue   . Esophageal mass   . Heart murmur    Echo 2/09 revealing aortic sclerosis without gradient  . Hyperlipemia   . Hypertension   . Left shoulder pain   . Pancreatitis 07/30/2017   Lipase critically elevated, greater than 3x normal   Past Surgical History:  Procedure Laterality Date  . CHOLECYSTECTOMY    . COLONOSCOPY    . COLONOSCOPY WITH PROPOFOL N/A 05/05/2017   Procedure: COLONOSCOPY WITH PROPOFOL;  Surgeon: Toledo, Benay Pike, MD;  Location:  [facility] ENDOSCOPY;  Service: Gastroenterology;  Laterality: N/A;  . EUS N/A 12/22/2017   Procedure: FULL UPPER ENDOSCOPIC ULTRASOUND (EUS) RADIAL;  Surgeon: Arta Silence, MD;  Location: [facility] ENDOSCOPY;  Service: Endoscopy;  Laterality: N/A;  . schatzki's ring     s/p dilation 2/04  . TUBAL LIGATION     Social History   Socioeconomic History  . Marital status: Divorced    Spouse name: Not on file  . Number of children: Not on file  . Years of education: Not on file  . Highest education level: Not on file  Occupational History  . Not on file  Tobacco Use  . Smoking status: Never Smoker  . Smokeless tobacco: Current User    Types: Chew  . Tobacco comment: Chews 1 pack per day  Substance and Sexual Activity  . Alcohol use: Never  . Drug use: No  . Sexual activity: Not Currently  Other Topics Concern  . Not on file  Social History  Narrative  . Not on file   Social Determinants of Health   Financial Resource Strain:   . Difficulty of Paying Living Expenses: Not on file  Food Insecurity:   . Worried About Programme researcher, broadcasting/film/video in the Last Year: Not on file  . Ran Out of Food in the Last Year: Not on file  Transportation Needs:   . Lack of Transportation (Medical): Not on file  . Lack of Transportation (Non-Medical): Not on file  Physical Activity:   . Days of Exercise per Week: Not on file  . Minutes of Exercise per Session: Not on file  Stress:   . Feeling of Stress : Not on file  Social Connections:   . Frequency of Communication with Friends and Family: Not on file  . Frequency of Social Gatherings with Friends and Family: Not on file  . Attends Religious Services: Not on file  . Active Member of Clubs or Organizations: Not on file  . Attends Banker Meetings: Not on file  . Marital Status: Not on file   Outpatient Encounter Medications as of 02/24/2019  Medication Sig  . acetaminophen (TYLENOL) 500 MG tablet Take 1,000 mg by mouth every 6 (six) hours as  needed for moderate pain or headache.  Marland Kitchen amLODipine (NORVASC) 10 MG tablet Take 1 tablet (10 mg total) by mouth daily.  . Cholecalciferol (VITAMIN D3) 125 MCG (5000 UT) CAPS Take 1 capsule (5,000 Units total) by mouth daily.  . diclofenac sodium (VOLTAREN) 1 % GEL Apply 2 g topically 4 (four) times daily.  Marland Kitchen gabapentin (NEURONTIN) 300 MG capsule Take 1 capsule (300 mg total) by mouth 2 (two) times daily.  Marland Kitchen gabapentin (NEURONTIN) 600 MG tablet TAKE 1 TABLET TWICE DAILY  . glipiZIDE (GLUCOTROL XL) 5 MG 24 hr tablet Take 1 tablet (5 mg total) by mouth daily with breakfast.  . Insulin Glargine (LANTUS SOLOSTAR) 100 UNIT/ML Solostar Pen Inject 40 Units into the skin at bedtime.  Marland Kitchen lisinopril (ZESTRIL) 20 MG tablet TAKE 1 TABLET EVERY DAY  . lovastatin (MEVACOR) 20 MG tablet TAKE 1 TABLET EVERY DAY  . metFORMIN (GLUCOPHAGE) 1000 MG tablet TAKE 1 TABLET TWICE DAILY  . pantoprazole (PROTONIX) 40 MG tablet TAKE 1 TABLET TWICE DAILY  . Probiotic Product (ALIGN PO) Take by mouth.  . vitamin B-12 (CYANOCOBALAMIN) 500 MCG tablet Take 500 mcg by mouth daily.  . vitamin C (ASCORBIC ACID) 500 MG tablet Take 500 mg by mouth daily.   No facility-administered encounter medications on file as of 02/24/2019.    ALLERGIES: Allergies  Allergen Reactions  . Duloxetine Rash  . Sitagliptin Other (See Comments)    pancreatitis  . Dulaglutide Nausea And Vomiting and Nausea Only    Trulicity   . Hydrocodone Other (See Comments)    Edema   . Lyrica [Pregabalin] Other (See Comments)    Edema    VACCINATION STATUS: Immunization History  Administered Date(s) Administered  . Fluad Quad(high Dose 65+) 11/21/2018    Diabetes She presents for her follow-up diabetic visit. She has type 2 diabetes mellitus. Onset time: She was diagnosed at approximate age of 69 years. Her disease course has been improving. There are no hypoglycemic associated symptoms. Pertinent negatives for hypoglycemia include no confusion,  headaches, pallor or seizures. Pertinent negatives for diabetes include no chest pain, no fatigue, no polydipsia and no polyphagia. There are no hypoglycemic complications. Symptoms are improving. There are no diabetic complications. Risk factors for coronary artery disease include diabetes  mellitus, dyslipidemia, family history, hypertension, obesity, sedentary lifestyle, post-menopausal and tobacco exposure. Current diabetic treatment includes oral agent (dual therapy). Compliance with diabetes treatment: She is currently on metformin 500 mg p.o. twice daily, Invokana 300 mg p.o. daily. Her weight is fluctuating minimally. She is following a generally unhealthy diet. When asked about meal planning, she reported none. She has not had a previous visit with a dietitian (Patient declined a consult.). She rarely participates in exercise. Her home blood glucose trend is decreasing steadily. Her breakfast blood glucose range is generally 130-140 mg/dl. Her bedtime blood glucose range is generally 130-140 mg/dl. Her overall blood glucose range is 130-140 mg/dl. (She reports improving glycemic profile over the last 15 days, fasting ranging between 83-155.  No hypoglycemia.  Prior to her last visit, A1c of 9.6%.) An ACE inhibitor/angiotensin II receptor blocker is being taken. Eye exam is not current.  Hyperlipidemia This is a chronic problem. The current episode started more than 1 year ago. The problem is controlled. Exacerbating diseases include diabetes and obesity. Pertinent negatives include no chest pain, myalgias or shortness of breath. Current antihyperlipidemic treatment includes statins. Risk factors for coronary artery disease include dyslipidemia, diabetes mellitus, hypertension, a sedentary lifestyle, post-menopausal and obesity.  Hypertension This is a chronic problem. The current episode started more than 1 year ago. The problem is controlled. Pertinent negatives include no chest pain, headaches,  palpitations or shortness of breath. Risk factors for coronary artery disease include dyslipidemia, diabetes mellitus, obesity, sedentary lifestyle, smoking/tobacco exposure, family history and post-menopausal state. Past treatments include ACE inhibitors.    Review of systems: Limited as above.  Objective:    There were no vitals taken for this visit.  Wt Readings from Last 3 Encounters:  11/21/18 208 lb 3.2 oz (94.4 kg)  10/24/18 205 lb (93 kg)  09/22/18 201 lb 6.4 oz (91.4 kg)      CMP ( most recent) CMP     Component Value Date/Time   NA 141 11/07/2018 0818   NA 141 06/11/2017 1655   K 3.6 11/07/2018 0818   CL 100 11/07/2018 0818   CO2 31 11/07/2018 0818   GLUCOSE 255 (H) 11/07/2018 0818   BUN 13 11/07/2018 0818   BUN 16 06/11/2017 1655   CREATININE 0.81 11/07/2018 0818   CALCIUM 9.7 11/07/2018 0818   PROT 6.6 11/07/2018 0818   PROT 7.6 06/11/2017 1655   ALBUMIN 4.1 07/30/2017 2157   ALBUMIN 5.0 (H) 06/11/2017 1655   AST 10 11/07/2018 0818   ALT 11 11/07/2018 0818   ALKPHOS 105 07/30/2017 2157   BILITOT 0.3 11/07/2018 0818   BILITOT <0.2 06/11/2017 1655   GFRNONAA 76 11/07/2018 0818   GFRAA 88 11/07/2018 0818     Diabetic Labs (most recent): Lab Results  Component Value Date   HGBA1C 9.6 (H) 11/07/2018   HGBA1C 8.5 (H) 08/01/2018   HGBA1C 8.2 (H) 03/16/2018     Lipid Panel     Component Value Date/Time   CHOL 148 11/19/2017 1009   TRIG 130 11/19/2017 1009   HDL 66 11/19/2017 1009   CHOLHDL 2.2 11/19/2017 1009   LDLCALC 61 11/19/2017 1009      Assessment & Plan:   1. Uncontrolled type 2 diabetes mellitus with hyperglycemia (HCC)  - Jordanne Elsbury Wolke has currently uncontrolled symptomatic type 2 DM since 69 years of age.     She reports improving glycemic profile over the last 15 days, fasting ranging between 83-155.  No hypoglycemia.  Prior to  her last visit, A1c of 9.6%.  Recent labs reviewed with her.  -She did not report gross  complications from her diabetes, however Rosaland Altimari Incorvaia remains at a high risk for more acute and chronic complications which include CAD, CVA, CKD, retinopathy, and neuropathy. These are all discussed in detail with the patient.  - I have counseled her on diet management and weight loss, by adopting a carbohydrate restricted/protein rich diet.  -She still admits to dietary discretion including consumption of sweetened beverages.  - she  admits there is a room for improvement in her diet and drink choices. -  Suggestion is made for her to avoid simple carbohydrates  from her diet including Cakes, Sweet Desserts / Pastries, Ice Cream, Soda (diet and regular), Sweet Tea, Candies, Chips, Cookies, Sweet Pastries,  Store Bought Juices, Alcohol in Excess of  1-2 drinks a day, Artificial Sweeteners, Coffee Creamer, and "Sugar-free" Products. This will help patient to have stable blood glucose profile and potentially avoid unintended weight gain.   - I encouraged her to switch to  unprocessed or minimally processed complex starch and increased protein intake (animal or plant source), fruits, and vegetables.  - she is advised to stick to a routine mealtimes to eat 3 meals  a day and avoid unnecessary snacks ( to snack only to correct hypoglycemia).   - I have approached her with the following individualized plan to manage diabetes and patient agrees:   -She has responded to her current treatment regimen with controlled glycemic profile both fasting and at bedtime.  -She is advised to continue Lantus 40 units nightly, continue strict monitoring of blood glucose twice a day-daily before breakfast and at bedtime.  She will also continue her metformin 1000 mg p.o. twice daily after breakfast and after supper.   -She continue to benefit from low-dose glipizide therapy.  She is advised to continue glipizide 5 mg XL p.o. daily at breakfast.    -Patient is encouraged to call clinic for blood glucose levels  less than 70 or above 200 mg /dl.  - she is not a suitable candidate for incretin therapy given recent history of pancreatitis.    - Patient specific target  A1c;  LDL, HDL, Triglycerides,  were discussed in detail.  2) BP/HTN:  she is advised to home monitor blood pressure and report if > 140/90 on 2 separate readings.   She is advised to continue her current blood pressure medications including lisinopril 20 mg daily, amlodipine 10 mg daily.  3) Lipids/HPL:   She has hypertriglyceridemia-improving to 130, history of pancreatitis.  Her recent lipid panel showed controlled LDL at 61.  She will continue to benefit from statin therapy.  She is advised to continue lovastatin 20 mg p.o. nightly.    4)  Weight/Diet: CDE Consult is declined by the patient,  exercise, and detailed carbohydrates information provided.  5) Chronic Care/Health Maintenance:  -she  is on ACEI/ARB and Statin medications and  is encouraged to initiate and continue to follow up with Ophthalmology, Dentist,  Podiatrist at least yearly or according to recommendations, and advised to quit tobacco products. I have recommended yearly flu vaccine and pneumonia vaccine at least every 5 years; moderate intensity exercise for up to 150 minutes weekly; and  sleep for at least 7 hours a day.  - I advised patient to maintain close follow up with Avanell Shackleton, NP-C for primary care needs.  - Time spent on this patient care encounter:  35 min, of  which >50% was spent in  counseling and the rest reviewing her  current and  previous labs/studies ( including abstraction from other facilities),  previous treatments, her blood glucose readings, and medications' doses and developing a plan for long-term care based on the latest recommendations for standards of care; and documenting her care.  Christene Slates Cowher participated in the discussions, expressed understanding, and voiced agreement with the above plans.  All questions were answered  to her satisfaction. she is encouraged to contact clinic should she have any questions or concerns prior to her return visit.   Follow up plan: - Return in about 3 months (around 05/24/2019) for Bring Meter and Logs- A1c in Office, Include 8 log sheets.  Marquis Lunch, MD [facility] [address] [place], [place] [address] Phone: [phone]  Fax: [phone]    02/24/2019, 12:05 PM  This note was partially dictated with voice recognition software. Similar sounding words can be transcribed inadequately or may not  be corrected upon review.

## 2019-03-01 ENCOUNTER — Other Ambulatory Visit: Payer: Self-pay

## 2019-03-01 ENCOUNTER — Ambulatory Visit (INDEPENDENT_AMBULATORY_CARE_PROVIDER_SITE_OTHER): Payer: Medicare HMO | Admitting: Physical Therapy

## 2019-03-01 ENCOUNTER — Encounter: Payer: Self-pay | Admitting: Physical Therapy

## 2019-03-01 DIAGNOSIS — R293 Abnormal posture: Secondary | ICD-10-CM | POA: Diagnosis not present

## 2019-03-01 DIAGNOSIS — G8929 Other chronic pain: Secondary | ICD-10-CM

## 2019-03-01 DIAGNOSIS — M25611 Stiffness of right shoulder, not elsewhere classified: Secondary | ICD-10-CM | POA: Diagnosis not present

## 2019-03-01 DIAGNOSIS — M25511 Pain in right shoulder: Secondary | ICD-10-CM | POA: Diagnosis not present

## 2019-03-01 NOTE — Therapy (Signed)
[facility] [address] [place], [place], [address] Phone: [phone]   Fax:  [phone]  Physical Therapy Treatment  Patient Details  Name: Destiny Hurley MRN: [id number] Date of Birth: April 13, 1950 Referring Provider (PT): Dr. Apolonio Schneiders   Encounter Date: 03/01/2019  PT End of Session - 03/01/19 1419    Visit Number  6    Number of Visits  12    Date for PT Re-Evaluation  03/13/19    PT Start Time  1315    PT Stop Time  1400    PT Time Calculation (min)  45 min    Activity Tolerance  Patient tolerated treatment well    Behavior During Therapy  [facility] for tasks assessed/performed       Past Medical History:  Diagnosis Date  . Adrenal mass ([place]) 2012   [place] pen on CT  . Chronic painful diabetic neuropathy ([place])   . Depression    situational  . Diabetes mellitus   . Dysplasia of tongue   . Esophageal mass   . Heart murmur    Echo 2/09 revealing aortic sclerosis without gradient  . Hyperlipemia   . Hypertension   . Left shoulder pain   . Pancreatitis 07/30/2017   Lipase critically elevated, greater than 3x normal    Past Surgical History:  Procedure Laterality Date  . CHOLECYSTECTOMY    . COLONOSCOPY    . COLONOSCOPY WITH PROPOFOL N/A 05/05/2017   Procedure: COLONOSCOPY WITH PROPOFOL;  Surgeon: Toledo, Benay Pike, MD;  Location: [facility] ENDOSCOPY;  Service: Gastroenterology;  Laterality: N/A;  . EUS N/A 12/22/2017   Procedure: FULL UPPER ENDOSCOPIC ULTRASOUND (EUS) RADIAL;  Surgeon: Arta Silence, MD;  Location: [facility]OSCOPY;  Service: Endoscopy;  Laterality: N/A;  . schatzki's ring     s/p dilation 2/04  . TUBAL LIGATION      There were no vitals filed for this visit.  Subjective Assessment - 03/01/19 1310    Subjective  shoulder is okay today. abduction past 90 deg and any reaching behind back causes pain to go up to 8-9/10    Limitations  Lifting    Patient Stated Goals  be able to squat and get back up, "if I fall  get back up," sleep on Rt side    Currently in Pain?  No/denies    Pain Onset  More than a month ago                       [facility] Adult PT Treatment/Exercise - 03/01/19 1320      Shoulder Exercises: Standing   Internal Rotation  Right;AAROM;15 reps   1# bar, with extension   Extension  Both;15 reps   1# bar     Shoulder Exercises: Pulleys   Flexion  2 minutes    ABduction  2 minutes      Moist Heat Therapy   Number Minutes Moist Heat  10 Minutes    Moist Heat Location  Shoulder      Electrical Stimulation   Electrical Stimulation Location  Rt shoulder    Electrical Stimulation Action  IFC    Electrical Stimulation Parameters  to tolerance     Electrical Stimulation Goals  Pain      Manual Therapy   Manual Therapy  Passive ROM    Passive ROM  Rt shoulder all motions with focus on IR  PT Long Term Goals - 01/30/19 1440      PT LONG TERM GOAL #1   Title  independent with HEP    Status  New    Target Date  03/13/19      PT LONG TERM GOAL #2   Title  report pain < 6/10 with activity for improved function    Status  New    Target Date  03/13/19      PT LONG TERM GOAL #3   Title  improve functional IR to at least L1 for improved ADLs/decreased pain with toileting    Status  New    Target Date  03/13/19      PT LONG TERM GOAL #4   Title  imrpove Rt shoulder flexion/abduction by at least 15 degrees for improved function    Status  New    Target Date  03/13/19            Plan - 03/01/19 1420    Clinical Impression Statement  Pt demonstrated decreased pain following manual therapy today and reported decreased tightness.  Overall improvements noted in flexion and abduction, but reaching behind the back continues to be a challenge.  Will continue to benefit from PT to maximize function.    Personal Factors and Comorbidities  Age;Comorbidity 3+;Past/Current Experience    Comorbidities  DM with neuropathy, obesity,  depression, HTN    Examination-Activity Limitations  Lift;Reach Overhead;Carry;Sleep;Hygiene/Grooming;Dressing    Examination-Participation Restrictions  Driving    Stability/Clinical Decision Making  Evolving/Moderate complexity    Rehab Potential  Good    PT Frequency  2x / week    PT Duration  6 weeks    PT Treatment/Interventions  ADLs/Self Care Home Management;Cryotherapy;Electrical Stimulation;Ultrasound;Moist Heat;Iontophoresis 4mg /ml Dexamethasone;Functional mobility training;Therapeutic activities;Therapeutic exercise;Patient/family education;Neuromuscular re-education;Manual techniques;Taping;Dry needling;Passive range of motion    PT Next Visit Plan  measure flexion and abduction, continue with ROM exercises, strengthening as tolerated    PT Home Exercise Plan  Access Code: [email or web address]    Consulted and Agree with Plan of Care  Patient       Patient will benefit from skilled therapeutic intervention in order to improve the following deficits and impairments:  Increased fascial restricitons, Increased muscle spasms, Pain, Postural dysfunction, Decreased range of motion, Decreased strength, Impaired UE functional use  Visit Diagnosis: Chronic right shoulder pain  Stiffness of right shoulder, not elsewhere classified  Abnormal posture     Problem List Patient Active Problem List   Diagnosis Date Noted  . Chronic right shoulder pain 09/22/2018  . Breast pain, right 06/21/2018  . Type 2 diabetes mellitus with complication, without long-term current use of insulin (HCC) 03/29/2018  . Vitamin D deficiency 11/25/2017  . Essential hypertension, benign 09/22/2017  . Mixed hyperlipidemia 09/22/2017  . Abdominal pain 08/04/2017  . Elevated lipase 08/04/2017  . Uncontrolled type 2 diabetes mellitus with hyperglycemia (HCC) 08/04/2017  . Heart murmur 08/04/2017  . Schatzki's ring 08/04/2017  . Swelling of right foot 07/08/2017  . Dysuria 07/08/2017  . Left thigh pain 06/11/2017  .  Proximal limb muscle weakness 04/01/2017  . Type 2 diabetes mellitus with diabetic neuropathy (HCC) 04/01/2017  . Chronic diarrhea 04/01/2017  . Lumbar disc disease 03/17/2017  . Chronic painful diabetic neuropathy (HCC) 06/20/2014      08/20/2014, PT, DPT 03/01/19 2:24 PM     [facility] [address] [place], [place], [place] Phone: [phone]   Fax:  [phone]  Name: Kaydi Kley MRN:  [id number] Date of Birth: [birth year]

## 2019-03-02 ENCOUNTER — Other Ambulatory Visit: Payer: Self-pay | Admitting: Family Medicine

## 2019-03-02 DIAGNOSIS — R109 Unspecified abdominal pain: Secondary | ICD-10-CM

## 2019-03-02 NOTE — Telephone Encounter (Signed)
Is this okay to refil? 

## 2019-03-02 NOTE — Telephone Encounter (Signed)
Ok to give 90 days and then she will need an appt

## 2019-03-03 ENCOUNTER — Other Ambulatory Visit: Payer: Self-pay

## 2019-03-03 ENCOUNTER — Ambulatory Visit: Payer: Medicare HMO | Admitting: Physical Therapy

## 2019-03-03 DIAGNOSIS — M25611 Stiffness of right shoulder, not elsewhere classified: Secondary | ICD-10-CM | POA: Diagnosis not present

## 2019-03-03 DIAGNOSIS — G8929 Other chronic pain: Secondary | ICD-10-CM

## 2019-03-03 DIAGNOSIS — M25511 Pain in right shoulder: Secondary | ICD-10-CM | POA: Diagnosis not present

## 2019-03-03 DIAGNOSIS — R293 Abnormal posture: Secondary | ICD-10-CM | POA: Diagnosis not present

## 2019-03-03 NOTE — Therapy (Signed)
[facility] [address] [place], [place], [address] Phone: [phone]   Fax:  [phone]  Physical Therapy Treatment  Patient Details  Name: Destiny Hurley MRN: [id number] Date of Birth: [birth year] Referring Provider (PT): Dr. Alonna Buckler   Encounter Date: 03/03/2019  PT End of Session - 03/03/19 1536    Visit Number  7    Number of Visits  12    Date for PT Re-Evaluation  03/13/19    PT Start Time  1315    PT Stop Time  1400    PT Time Calculation (min)  45 min    Activity Tolerance  Patient tolerated treatment well    Behavior During Therapy  [facility] for tasks assessed/performed       Past Medical History:  Diagnosis Date  . Adrenal mass (HCC) 2012   [place] pen on CT  . Chronic painful diabetic neuropathy (HCC)   . Depression    situational  . Diabetes mellitus   . Dysplasia of tongue   . Esophageal mass   . Heart murmur    Echo 2/09 revealing aortic sclerosis without gradient  . Hyperlipemia   . Hypertension   . Left shoulder pain   . Pancreatitis 07/30/2017   Lipase critically elevated, greater than 3x normal    Past Surgical History:  Procedure Laterality Date  . CHOLECYSTECTOMY    . COLONOSCOPY    . COLONOSCOPY WITH PROPOFOL N/A 05/05/2017   Procedure: COLONOSCOPY WITH PROPOFOL;  Surgeon: Toledo, Boykin Nearing, MD;  Location: [facility] ENDOSCOPY;  Service: Gastroenterology;  Laterality: N/A;  . EUS N/A 12/22/2017   Procedure: FULL UPPER ENDOSCOPIC ULTRASOUND (EUS) RADIAL;  Surgeon: Willis Modena, MD;  Location: [facility];  Service: Endoscopy;  Laterality: N/A;  . schatzki's ring     s/p dilation 2/04  . TUBAL LIGATION      There were no vitals filed for this visit.  Subjective Assessment - 03/03/19 1533    Subjective  Only about 2/10 shoulder pain today, still having some trouble going behind my back         [facility] PT Assessment - 03/03/19 0001      Assessment   Medical Diagnosis  Rt shoulder pain    Referring  Provider (PT)  Dr. Alonna Buckler      AROM   Right Shoulder Flexion  160 Degrees    Right Shoulder ABduction  150 Degrees                   [facility] Adult PT Treatment/Exercise - 03/03/19 0001      Shoulder Exercises: Standing   Internal Rotation  Right;AAROM;15 reps    Extension  Both;20 reps    Theraband Level (Shoulder Extension)  Level 2 (Red)    Row  Both;20 reps    Theraband Level (Shoulder Row)  Level 2 (Red)      Shoulder Exercises: Pulleys   Flexion  2 minutes    ABduction  2 minutes      Shoulder Exercises: ROM/Strengthening   UBE (Upper Arm Bike)  2 min fwd/2 min retro; L2.0    Ranger  X 15 reps flexion, scaption, circles    Wall Wash  wall ladder X 5 reps flexion, scaption      Shoulder Exercises: Stretch   Internal Rotation Stretch  10 seconds    Internal Rotation Stretch Limitations  10 reps behind back with strap      Moist Heat Therapy   Number Minutes Moist  Heat  10 Minutes    Moist Heat Location  Shoulder      Electrical Stimulation   Electrical Stimulation Location  Rt shoulder    Electrical Stimulation Action  IFC    Electrical Stimulation Parameters  tolerance    Electrical Stimulation Goals  Pain                  PT Long Term Goals - 01/30/19 1440      PT LONG TERM GOAL #1   Title  independent with HEP    Status  New    Target Date  03/13/19      PT LONG TERM GOAL #2   Title  report pain < 6/10 with activity for improved function    Status  New    Target Date  03/13/19      PT LONG TERM GOAL #3   Title  improve functional IR to at least L1 for improved ADLs/decreased pain with toileting    Status  New    Target Date  03/13/19      PT LONG TERM GOAL #4   Title  imrpove Rt shoulder flexion/abduction by at least 15 degrees for improved function    Status  New    Target Date  03/13/19            Plan - 03/03/19 1543    Clinical Impression Statement  She is progressing well with pain levels and ROM but still  lacking Rt shoulder IR. She had good tolerance to session without complaints. Continue POC    Personal Factors and Comorbidities  Age;Comorbidity 3+;Past/Current Experience    Comorbidities  DM with neuropathy, obesity, depression, HTN    Examination-Activity Limitations  Lift;Reach Overhead;Carry;Sleep;Hygiene/Grooming;Dressing    Examination-Participation Restrictions  Driving    Stability/Clinical Decision Making  Evolving/Moderate complexity    Rehab Potential  Good    PT Frequency  2x / week    PT Duration  6 weeks    PT Treatment/Interventions  ADLs/Self Care Home Management;Cryotherapy;Electrical Stimulation;Ultrasound;Moist Heat;Iontophoresis 4mg /ml Dexamethasone;Functional mobility training;Therapeutic activities;Therapeutic exercise;Patient/family education;Neuromuscular re-education;Manual techniques;Taping;Dry needling;Passive range of motion    PT Next Visit Plan  measure flexion and abduction, continue with ROM exercises, strengthening as tolerated    PT Home Exercise Plan  Access Code: [email or web address]    Consulted and Agree with Plan of Care  Patient       Patient will benefit from skilled therapeutic intervention in order to improve the following deficits and impairments:  Increased fascial restricitons, Increased muscle spasms, Pain, Postural dysfunction, Decreased range of motion, Decreased strength, Impaired UE functional use  Visit Diagnosis: Chronic right shoulder pain  Stiffness of right shoulder, not elsewhere classified  Abnormal posture     Problem List Patient Active Problem List   Diagnosis Date Noted  . Chronic right shoulder pain 09/22/2018  . Breast pain, right 06/21/2018  . Type 2 diabetes mellitus with complication, without long-term current use of insulin (HCC) 03/29/2018  . Vitamin D deficiency 11/25/2017  . Essential hypertension, benign 09/22/2017  . Mixed hyperlipidemia 09/22/2017  . Abdominal pain 08/04/2017  . Elevated lipase 08/04/2017  .  Uncontrolled type 2 diabetes mellitus with hyperglycemia (HCC) 08/04/2017  . Heart murmur 08/04/2017  . Schatzki's ring 08/04/2017  . Swelling of right foot 07/08/2017  . Dysuria 07/08/2017  . Left thigh pain 06/11/2017  . Proximal limb muscle weakness 04/01/2017  . Type 2 diabetes mellitus with diabetic neuropathy (HCC) 04/01/2017  . Chronic diarrhea 04/01/2017  .  Lumbar disc disease 03/17/2017  . Chronic painful diabetic neuropathy [facility]) 06/20/2014    Silvestre Mesi 03/03/2019, 3:46 PM  [facility] [address] [place], [place], [address] Phone: [phone]   Fax:  [phone]  Name: Destiny Hurley MRN: [id number] Date of Birth: [birth year]

## 2019-03-08 ENCOUNTER — Other Ambulatory Visit: Payer: Self-pay

## 2019-03-08 ENCOUNTER — Ambulatory Visit: Payer: Medicare HMO | Admitting: Rehabilitative and Restorative Service Providers"

## 2019-03-08 ENCOUNTER — Encounter: Payer: Self-pay | Admitting: Rehabilitative and Restorative Service Providers"

## 2019-03-08 DIAGNOSIS — G8929 Other chronic pain: Secondary | ICD-10-CM

## 2019-03-08 DIAGNOSIS — M25511 Pain in right shoulder: Secondary | ICD-10-CM

## 2019-03-08 DIAGNOSIS — M25611 Stiffness of right shoulder, not elsewhere classified: Secondary | ICD-10-CM

## 2019-03-08 DIAGNOSIS — R293 Abnormal posture: Secondary | ICD-10-CM

## 2019-03-08 NOTE — Therapy (Signed)
[facility] [address] [place], [place], [address] Phone: [phone]   Fax:  [phone]  Physical Therapy Treatment  Patient Details  Name: Destiny Hurley MRN: [id number] Date of Birth: [birth year] Referring Provider (PT): Dr. Apolonio Schneiders   Encounter Date: 03/08/2019  PT End of Session - 03/08/19 1341    Visit Number  8    Number of Visits  12    Date for PT Re-Evaluation  03/13/19    PT Start Time  1312    PT Stop Time  1353    PT Time Calculation (min)  41 min    Activity Tolerance  Patient tolerated treatment well    Behavior During Therapy  [facility] for tasks assessed/performed       Past Medical History:  Diagnosis Date  . Adrenal mass ([place]) 2012   [place] pen on CT  . Chronic painful diabetic neuropathy ([place])   . Depression    situational  . Diabetes mellitus   . Dysplasia of tongue   . Esophageal mass   . Heart murmur    Echo 2/09 revealing aortic sclerosis without gradient  . Hyperlipemia   . Hypertension   . Left shoulder pain   . Pancreatitis 07/30/2017   Lipase critically elevated, greater than 3x normal    Past Surgical History:  Procedure Laterality Date  . CHOLECYSTECTOMY    . COLONOSCOPY    . COLONOSCOPY WITH PROPOFOL N/A 05/05/2017   Procedure: COLONOSCOPY WITH PROPOFOL;  Surgeon: Toledo, Benay Pike, MD;  Location: [facility] ENDOSCOPY;  Service: Gastroenterology;  Laterality: N/A;  . EUS N/A 12/22/2017   Procedure: FULL UPPER ENDOSCOPIC ULTRASOUND (EUS) RADIAL;  Surgeon: Arta Silence, MD;  Location: [facility];  Service: Endoscopy;  Laterality: N/A;  . schatzki's ring     s/p dilation 2/04  . TUBAL LIGATION      There were no vitals filed for this visit.  Subjective Assessment - 03/08/19 1339    Subjective  Pt. stated some ache after sleeping on arm last night.  Pt. indicated feeling tight behind back.    Currently in Pain?  Yes    Pain Score  2     Pain Location  Shoulder    Pain Orientation  Right    Pain Descriptors / Indicators  Aching                       [facility] Adult PT Treatment/Exercise - 03/08/19 0001      Shoulder Exercises: Standing   Extension  Both;Strengthening   3 x 10 blue band   Theraband Level (Shoulder Extension)  Level 4 (Blue)    Medical sales representative;Both   3 x 10 blue band   Theraband Level (Shoulder Row)  Level 4 (Blue)    Other Standing Exercises  standing shoulder extension bar lift off legs 2 x 10      Shoulder Exercises: ROM/Strengthening   UBE (Upper Arm Bike)  lvl 1 3 mins fwd/reverse each     Ranger  X 15 reps flexion, scaption, circles      Manual Therapy   Manual Therapy  Muscle Energy Technique    Manual therapy comments  AP, inferior R GH joint mobilizations g3    Muscle Energy Technique  IR in 90 deg abd             PT Education - 03/08/19 1340    Education Details  Cues for intervention in clinic.    Person(s)  Educated  Patient    Methods  Explanation;Verbal cues    Comprehension  Verbalized understanding;Returned demonstration          PT Long Term Goals - 01/30/19 1440      PT LONG TERM GOAL #1   Title  independent with HEP    Status  New    Target Date  03/13/19      PT LONG TERM GOAL #2   Title  report pain < 6/10 with activity for improved function    Status  New    Target Date  03/13/19      PT LONG TERM GOAL #3   Title  improve functional IR to at least L1 for improved ADLs/decreased pain with toileting    Status  New    Target Date  03/13/19      PT LONG TERM GOAL #4   Title  imrpove Rt shoulder flexion/abduction by at least 15 degrees for improved function    Status  New    Target Date  03/13/19            Plan - 03/08/19 1340    Clinical Impression Statement  IR mobility limited but improved c MET techniques and joint mobilization in clinic today.  Once  it is improved, Pt. demonstrates almost full active movement.    Personal Factors and Comorbidities  Age;Comorbidity  3+;Past/Current Experience    Comorbidities  DM with neuropathy, obesity, depression, HTN    Examination-Activity Limitations  Lift;Reach Overhead;Carry;Sleep;Hygiene/Grooming;Dressing    Examination-Participation Restrictions  Driving    Stability/Clinical Decision Making  Evolving/Moderate complexity    Rehab Potential  Good    PT Frequency  2x / week    PT Duration  6 weeks    PT Treatment/Interventions  ADLs/Self Care Home Management;Cryotherapy;Electrical Stimulation;Ultrasound;Moist Heat;Iontophoresis 30m/ml Dexamethasone;Functional mobility training;Therapeutic activities;Therapeutic exercise;Patient/family education;Neuromuscular re-education;Manual techniques;Taping;Dry needling;Passive range of motion    PT Next Visit Plan  Continue to progress IR, extension mobility, reassess in next visit or two for possible HEP transition.    PT Home Exercise Plan  Access Code: [email or web address]   Consulted and Agree with Plan of Care  Patient       Patient will benefit from skilled therapeutic intervention in order to improve the following deficits and impairments:  Increased fascial restricitons, Increased muscle spasms, Pain, Postural dysfunction, Decreased range of motion, Decreased strength, Impaired UE functional use  Visit Diagnosis: Chronic right shoulder pain  Stiffness of right shoulder, not elsewhere classified  Abnormal posture     Problem List Patient Active Problem List   Diagnosis Date Noted  . Chronic right shoulder pain 09/22/2018  . Breast pain, right 06/21/2018  . Type 2 diabetes mellitus with complication, without long-term current use of insulin ([place] 03/29/2018  . Vitamin D deficiency 11/25/2017  . Essential hypertension, benign 09/22/2017  . Mixed hyperlipidemia 09/22/2017  . Abdominal pain 08/04/2017  . Elevated lipase 08/04/2017  . Uncontrolled type 2 diabetes mellitus with hyperglycemia ([place] 08/04/2017  . Heart murmur 08/04/2017  . Schatzki's ring 08/04/2017  .  Swelling of right foot 07/08/2017  . Dysuria 07/08/2017  . Left thigh pain 06/11/2017  . Proximal limb muscle weakness 04/01/2017  . Type 2 diabetes mellitus with diabetic neuropathy ([place] 04/01/2017  . Chronic diarrhea 04/01/2017  . Lumbar disc disease 03/17/2017  . Chronic painful diabetic neuropathy ([place] 06/20/2014   MScot Jun PT, DPT, OCS, ATC 03/08/19  1:53 PM    [facility] [address]  [place], [place], [address] Phone: [phone]   Fax:  [phone]  Name: Shyane Fossum MRN: [id number] Date of Birth: March 28, 1950

## 2019-03-10 ENCOUNTER — Other Ambulatory Visit: Payer: Self-pay

## 2019-03-10 ENCOUNTER — Encounter: Payer: Self-pay | Admitting: Physical Therapy

## 2019-03-10 ENCOUNTER — Ambulatory Visit: Payer: Medicare HMO | Admitting: Physical Therapy

## 2019-03-10 DIAGNOSIS — G8929 Other chronic pain: Secondary | ICD-10-CM

## 2019-03-10 DIAGNOSIS — M25611 Stiffness of right shoulder, not elsewhere classified: Secondary | ICD-10-CM

## 2019-03-10 DIAGNOSIS — R293 Abnormal posture: Secondary | ICD-10-CM

## 2019-03-10 DIAGNOSIS — M25511 Pain in right shoulder: Secondary | ICD-10-CM

## 2019-03-10 NOTE — Therapy (Signed)
[facility] [address] [place], [place], [address] Phone: [phone]   Fax:  [phone]  Physical Therapy Treatment  Patient Details  Name: Destiny Hurley MRN: [id number] Date of Birth: 03-Apr-1950 Referring Provider (PT): Dr. Apolonio Schneiders   Encounter Date: 03/10/2019  PT End of Session - 03/10/19 1203    Visit Number  9    Number of Visits  12    Date for PT Re-Evaluation  03/20/19   extended due to missed weeks   PT Start Time  1108    PT Stop Time  1153    PT Time Calculation (min)  45 min    Activity Tolerance  Patient tolerated treatment well    Behavior During Therapy  [facility] for tasks assessed/performed       Past Medical History:  Diagnosis Date  . Adrenal mass ([place]) 2012   [place] pen on CT  . Chronic painful diabetic neuropathy ([place])   . Depression    situational  . Diabetes mellitus   . Dysplasia of tongue   . Esophageal mass   . Heart murmur    Echo 2/09 revealing aortic sclerosis without gradient  . Hyperlipemia   . Hypertension   . Left shoulder pain   . Pancreatitis 07/30/2017   Lipase critically elevated, greater than 3x normal    Past Surgical History:  Procedure Laterality Date  . CHOLECYSTECTOMY    . COLONOSCOPY    . COLONOSCOPY WITH PROPOFOL N/A 05/05/2017   Procedure: COLONOSCOPY WITH PROPOFOL;  Surgeon: Toledo, Benay Pike, MD;  Location: [facility] ENDOSCOPY;  Service: Gastroenterology;  Laterality: N/A;  . EUS N/A 12/22/2017   Procedure: FULL UPPER ENDOSCOPIC ULTRASOUND (EUS) RADIAL;  Surgeon: Arta Silence, MD;  Location: [facility] ENDOSCOPY;  Service: Endoscopy;  Laterality: N/A;  . schatzki's ring     s/p dilation 2/04  . TUBAL LIGATION      There were no vitals filed for this visit.  Subjective Assessment - 03/10/19 1110    Subjective  shoulder is feeling better.  still wants to be able to reach up behind her back.    Patient Stated Goals  be able to squat and get back up, "if I fall get back up," sleep  on Rt side    Currently in Pain?  No/denies         [facility] PT Assessment - 03/10/19 1120      Assessment   Medical Diagnosis  Rt shoulder pain    Referring Provider (PT)  Dr. Apolonio Schneiders    Hand Dominance  Right                   [facility] Adult PT Treatment/Exercise - 03/10/19 1111      Self-Care   Self-Care  Other Self-Care Comments    Other Self-Care Comments   discussed current progress and pt's goals, pt at this time would like to be able to reach hand behind back, as this is her main limitation at this time.  Agreed to use remaining sessions to focus on this and will reassess to see if progress is being made.  At this time very limited progress noted with functional internal rotation, and pt in agreement that PT will d/c if no additional progress has been made.        Shoulder Exercises: Standing   Other Standing Exercises  standing shoulder extension bar lift off legs 2 x 10; 5 sec hold; AA extension with IR on Rt x20 reps with  1# bar      Shoulder Exercises: Pulleys   Other Pulley Exercises  internal rotation x 3 min      Shoulder Exercises: ROM/Strengthening   UBE (Upper Arm Bike)  lvl 1 3 mins fwd/reverse each       Manual Therapy   Manual therapy comments  AP, inferior R GH joint mobilizations g3    Passive ROM  Rt shoulder all motions with focus on IR             PT Education - 03/10/19 1203    Education Details  see self care    Person(s) Educated  Patient    Methods  Explanation    Comprehension  Verbalized understanding          PT Long Term Goals - 03/10/19 1204      PT LONG TERM GOAL #1   Title  independent with HEP    Status  On-going    Target Date  03/20/19      PT LONG TERM GOAL #2   Title  report pain < 6/10 with activity for improved function    Status  Achieved      PT LONG TERM GOAL #3   Title  improve functional IR to at least L1 for improved ADLs/decreased pain with toileting    Status  On-going    Target Date   03/20/19      PT LONG TERM GOAL #4   Title  imrpove Rt shoulder flexion/abduction by at least 15 degrees for improved function    Status  Achieved            Plan - 03/10/19 1204    Clinical Impression Statement  Overall pt has progressed well with PT, and 2 LTGs met at this time.  IR still main limitation and limited progress noted to date.  Will plan to focus on IR going forward, and reassess in 1-2 weeks to determine recert v/s d/c.    Personal Factors and Comorbidities  Age;Comorbidity 3+;Past/Current Experience    Comorbidities  DM with neuropathy, obesity, depression, HTN    Examination-Activity Limitations  Lift;Reach Overhead;Carry;Sleep;Hygiene/Grooming;Dressing    Examination-Participation Restrictions  Driving    Stability/Clinical Decision Making  Evolving/Moderate complexity    Rehab Potential  Good    PT Frequency  2x / week    PT Duration  6 weeks    PT Treatment/Interventions  ADLs/Self Care Home Management;Cryotherapy;Electrical Stimulation;Ultrasound;Moist Heat;Iontophoresis 93m/ml Dexamethasone;Functional mobility training;Therapeutic activities;Therapeutic exercise;Patient/family education;Neuromuscular re-education;Manual techniques;Taping;Dry needling;Passive range of motion    PT Next Visit Plan  focus on IR, will need progress note, consider recert or d/c    PT Home Exercise Plan  Access Code: [email or web address]   Consulted and Agree with Plan of Care  Patient       Patient will benefit from skilled therapeutic intervention in order to improve the following deficits and impairments:  Increased fascial restricitons, Increased muscle spasms, Pain, Postural dysfunction, Decreased range of motion, Decreased strength, Impaired UE functional use  Visit Diagnosis: Chronic right shoulder pain  Stiffness of right shoulder, not elsewhere classified  Abnormal posture     Problem List Patient Active Problem List   Diagnosis Date Noted  . Chronic right shoulder pain  09/22/2018  . Breast pain, right 06/21/2018  . Type 2 diabetes mellitus with complication, without long-term current use of insulin ([place] 03/29/2018  . Vitamin D deficiency 11/25/2017  . Essential hypertension, benign 09/22/2017  . Mixed hyperlipidemia 09/22/2017  . Abdominal pain  08/04/2017  . Elevated lipase 08/04/2017  . Uncontrolled type 2 diabetes mellitus with hyperglycemia ([place]) 08/04/2017  . Heart murmur 08/04/2017  . Schatzki's ring 08/04/2017  . Swelling of right foot 07/08/2017  . Dysuria 07/08/2017  . Left thigh pain 06/11/2017  . Proximal limb muscle weakness 04/01/2017  . Type 2 diabetes mellitus with diabetic neuropathy ([place]) 04/01/2017  . Chronic diarrhea 04/01/2017  . Lumbar disc disease 03/17/2017  . Chronic painful diabetic neuropathy ([place]) 06/20/2014       Laureen Abrahams, PT, DPT 03/10/19 12:07 PM     [facility] [address] [place], [place], [address] Phone: [phone]   Fax:  [phone]  Name: Dakoda Bassette MRN: [id number] Date of Birth: 1950-06-26

## 2019-03-11 ENCOUNTER — Ambulatory Visit: Payer: Medicare HMO | Attending: Internal Medicine

## 2019-03-11 DIAGNOSIS — Z23 Encounter for immunization: Secondary | ICD-10-CM | POA: Insufficient documentation

## 2019-03-11 NOTE — Progress Notes (Signed)
   Covid-19 Vaccination Clinic  Name:  Destiny Hurley    MRN: [id number] DOB: Dec 26, 1950  03/11/2019  [name] was observed post Covid-19 immunization for 15 minutes without incidence. She was provided with Vaccine Information Sheet and instruction to access the V-Safe system.   [name] was instructed to call 911 with any severe reactions post vaccine: Marland Kitchen Difficulty breathing  . Swelling of your face and throat  . A fast heartbeat  . A bad rash all over your body  . Dizziness and weakness    Immunizations Administered    Name Date Dose VIS Date Route   Pfizer COVID-19 Vaccine 03/11/2019  1:05 PM 0.3 mL 12/30/2018 Intramuscular   Manufacturer: [employer], [employer]   Lot: [id number]   NDC: [address]-3

## 2019-03-15 ENCOUNTER — Ambulatory Visit (INDEPENDENT_AMBULATORY_CARE_PROVIDER_SITE_OTHER): Payer: Medicare HMO | Admitting: Physical Therapy

## 2019-03-15 ENCOUNTER — Other Ambulatory Visit: Payer: Self-pay

## 2019-03-15 ENCOUNTER — Encounter: Payer: Self-pay | Admitting: Physical Therapy

## 2019-03-15 DIAGNOSIS — M25611 Stiffness of right shoulder, not elsewhere classified: Secondary | ICD-10-CM

## 2019-03-15 DIAGNOSIS — G8929 Other chronic pain: Secondary | ICD-10-CM

## 2019-03-15 DIAGNOSIS — M25511 Pain in right shoulder: Secondary | ICD-10-CM | POA: Diagnosis not present

## 2019-03-15 DIAGNOSIS — R293 Abnormal posture: Secondary | ICD-10-CM | POA: Diagnosis not present

## 2019-03-15 NOTE — Therapy (Signed)
[facility] Therapy [address] [place], [place], [address] Phone: [phone]   Fax:  [phone]  Physical Therapy Treatment/Recert/Progress Note  Patient Details  Name: Destiny Hurley MRN: [id number] Date of Birth: [birth year] Referring Provider (PT): Dr. Alonna Buckler  Progress Note Reporting Period 01/30/19 to 03/15/19  See note below for Objective Data and Assessment of Progress/Goals.  Encounter Date: 03/15/2019  PT End of Session - 03/15/19 1414    Visit Number  10    Number of Visits  16    Date for PT Re-Evaluation  04/26/19   extended due to missed weeks   PT Start Time  1316    PT Stop Time  1403    PT Time Calculation (min)  47 min    Activity Tolerance  Patient tolerated treatment well    Behavior During Therapy  [facility] for tasks assessed/performed              Past Medical History:  Diagnosis Date  . Adrenal mass ([facility]) 2012   [place] pen on CT  . Chronic painful diabetic neuropathy ([facility])   . Depression    situational  . Diabetes mellitus   . Dysplasia of tongue   . Esophageal mass   . Heart murmur    Echo 2/09 revealing aortic sclerosis without gradient  . Hyperlipemia   . Hypertension   . Left shoulder pain   . Pancreatitis 07/30/2017   Lipase critically elevated, greater than 3x normal    Past Surgical History:  Procedure Laterality Date  . CHOLECYSTECTOMY    . COLONOSCOPY    . COLONOSCOPY WITH PROPOFOL N/A 05/05/2017   Procedure: COLONOSCOPY WITH PROPOFOL;  Surgeon: Toledo, Boykin Nearing, MD;  Location: [facility] ENDOSCOPY;  Service: Gastroenterology;  Laterality: N/A;  . EUS N/A 12/22/2017   Procedure: FULL UPPER ENDOSCOPIC ULTRASOUND (EUS) RADIAL;  Surgeon: Willis Modena, MD;  [facility];  Service: Endoscopy;  Laterality: N/A;  . schatzki's ring     s/p dilation 2/04  . TUBAL LIGATION      There were no vitals filed for this visit.  Subjective Assessment - 03/15/19 1317    Subjective  shoulder is  feeling better.  still wants to be able to reach up behind her back. not sure if she wants to come through the whole month of March.    Patient Stated Goals  be able to squat and get back up, "if I fall get back up," sleep on Rt side    Currently in Pain?  No/denies         [facility] PT Assessment - 03/15/19 1413      Assessment   Medical Diagnosis  Rt shoulder pain    Referring Provider (PT)  Dr. Alonna Buckler      AROM   Right Shoulder Internal Rotation  --   FIR to L4/5                  [facility] Adult PT Treatment/Exercise - 03/15/19 1325      Shoulder Exercises: Standing   Internal Rotation  Right;20 reps;Theraband    Theraband Level (Shoulder Internal Rotation)  Level 4 (Blue)    Other Standing Exercises  standing shoulder extension bar lift off legs 2 x 10; 5 sec hold; AA extension with IR on Rt x20 reps with 1# bar    Other Standing Exercises  rt shoulder internal rotation walk out with arm behind back for IR stretch x 5 reps with blue theraband  Shoulder Exercises: Pulleys   Other Pulley Exercises  internal rotation x 3 min      Shoulder Exercises: ROM/Strengthening   UBE (Upper Arm Bike)  L4 x 6 min (3' each direction)                  PT Long Term Goals - 03/15/19 1414      PT LONG TERM GOAL #1   Title  independent with HEP    Status  On-going    Target Date  04/26/19      PT LONG TERM GOAL #2   Title  report pain < 6/10 with activity for improved function    Status  Achieved      PT LONG TERM GOAL #3   Title  improve functional IR to at least L1 for improved ADLs/decreased pain with toileting    Status  On-going    Target Date  04/26/19      PT LONG TERM GOAL #4   Title  imrpove Rt shoulder flexion/abduction by at least 15 degrees for improved function    Status  Achieved            Plan - 03/15/19 1415    Clinical Impression Statement  Pt has demonstrated improvement in flexion and abduction ROM as well as decreased pain  meeting 2 LTGs.  HEP and functional IR goals ongoing at this time.  Plan to continue PT at 2x/wk this week then transition to 1x/wk x 3 wks to finalize HEP to focus in internal rotation ROM.  Will continue to benefit from PT to maximize function.    Personal Factors and Comorbidities  Age;Comorbidity 3+;Past/Current Experience    Comorbidities  DM with neuropathy, obesity, depression, HTN    Examination-Activity Limitations  Lift;Reach Overhead;Carry;Sleep;Hygiene/Grooming;Dressing    Examination-Participation Restrictions  Driving    Stability/Clinical Decision Making  Evolving/Moderate complexity    Rehab Potential  Good    PT Frequency  1x / week    PT Duration  4 weeks    PT Treatment/Interventions  ADLs/Self Care Home Management;Cryotherapy;Electrical Stimulation;Ultrasound;Moist Heat;Iontophoresis 4mg /ml Dexamethasone;Functional mobility training;Therapeutic activities;Therapeutic exercise;Patient/family education;Neuromuscular re-education;Manual techniques;Taping;Dry needling;Passive range of motion    PT Next Visit Plan  update HEP for IR, continue to focus on this as currently pt's only goal and limitation    PT Home Exercise Plan  Access Code: [email or web address]    Consulted and Agree with Plan of Care  Patient       Patient will benefit from skilled therapeutic intervention in order to improve the following deficits and impairments:  Increased fascial restricitons, Increased muscle spasms, Pain, Postural dysfunction, Decreased range of motion, Decreased strength, Impaired UE functional use  Visit Diagnosis: Chronic right shoulder pain - Plan: PT plan of care cert/re-cert  Stiffness of right shoulder, not elsewhere classified - Plan: PT plan of care cert/re-cert  Abnormal posture - Plan: PT plan of care cert/re-cert     Problem List Patient Active Problem List   Diagnosis Date Noted  . Chronic right shoulder pain 09/22/2018  . Breast pain, right 06/21/2018  . Type 2 diabetes  mellitus with complication, without long-term current use of insulin ([place]) 03/29/2018  . Vitamin D deficiency 11/25/2017  . Essential hypertension, benign 09/22/2017  . Mixed hyperlipidemia 09/22/2017  . Abdominal pain 08/04/2017  . Elevated lipase 08/04/2017  . Uncontrolled type 2 diabetes mellitus with hyperglycemia ([place]) 08/04/2017  . Heart murmur 08/04/2017  . Schatzki's ring 08/04/2017  . Swelling of right foot 07/08/2017  .  Dysuria 07/08/2017  . Left thigh pain 06/11/2017  . Proximal limb muscle weakness 04/01/2017  . Type 2 diabetes mellitus with diabetic neuropathy ([facility]) 04/01/2017  . Chronic diarrhea 04/01/2017  . Lumbar disc disease 03/17/2017  . Chronic painful diabetic neuropathy ([facility]) 06/20/2014      Clarita Crane, PT, DPT 03/15/19 2:19 PM     [facility] [address] [place], [place], [address] Phone: [phone]   Fax:  [phone]  Name: Destiny Hurley MRN: [id number] Date of Birth: [birth year]

## 2019-03-17 ENCOUNTER — Other Ambulatory Visit: Payer: Self-pay

## 2019-03-17 ENCOUNTER — Encounter: Payer: Self-pay | Admitting: Physical Therapy

## 2019-03-17 ENCOUNTER — Ambulatory Visit: Payer: Medicare HMO | Admitting: Physical Therapy

## 2019-03-17 DIAGNOSIS — M25511 Pain in right shoulder: Secondary | ICD-10-CM

## 2019-03-17 DIAGNOSIS — R293 Abnormal posture: Secondary | ICD-10-CM

## 2019-03-17 DIAGNOSIS — M25611 Stiffness of right shoulder, not elsewhere classified: Secondary | ICD-10-CM | POA: Diagnosis not present

## 2019-03-17 DIAGNOSIS — G8929 Other chronic pain: Secondary | ICD-10-CM

## 2019-03-17 NOTE — Therapy (Signed)
[facility] Physical Therapy [address] [place], [place], [address] Phone: [phone]   Fax:  [phone]  Physical Therapy Treatment  Patient Details  Name: Destiny Hurley MRN: [id number] Date of Birth: Apr 08, 1950 Referring Provider (PT): Dr. Alonna Buckler   Encounter Date: 03/17/2019  PT End of Session - 03/17/19 1154    Visit Number  11    Number of Visits  16    Date for PT Re-Evaluation  04/26/19   extended due to missed weeks   PT Start Time  1115   pt arrived late   PT Stop Time  1146    PT Time Calculation (min)  31 min    Activity Tolerance  Patient tolerated treatment well    Behavior During Therapy  [facility] for tasks assessed/performed       Past Medical History:  Diagnosis Date  . Adrenal mass ([facility]) 2012   [place] pen on CT  . Chronic painful diabetic neuropathy ([facility])   . Depression    situational  . Diabetes mellitus   . Dysplasia of tongue   . Esophageal mass   . Heart murmur    Echo 2/09 revealing aortic sclerosis without gradient  . Hyperlipemia   . Hypertension   . Left shoulder pain   . Pancreatitis 07/30/2017   Lipase critically elevated, greater than 3x normal    Past Surgical History:  Procedure Laterality Date  . CHOLECYSTECTOMY    . COLONOSCOPY    . COLONOSCOPY WITH PROPOFOL N/A 05/05/2017   Procedure: COLONOSCOPY WITH PROPOFOL;  Surgeon: Toledo, Boykin Nearing, MD;  Location: [facility] ENDOSCOPY;  Service: Gastroenterology;  Laterality: N/A;  . EUS N/A 12/22/2017   Procedure: FULL UPPER ENDOSCOPIC ULTRASOUND (EUS) RADIAL;  Surgeon: Willis Modena, MD;  Location: [facility];  Service: Endoscopy;  Laterality: N/A;  . schatzki's ring     s/p dilation 2/04  . TUBAL LIGATION      There were no vitals filed for this visit.  Subjective Assessment - 03/17/19 1118    Subjective  missed her turn so she showed up late for appt today, shoulder is doing well.    Patient Stated Goals  be able to squat and get back up, "if I fall get  back up," sleep on Rt side    Currently in Pain?  No/denies                       [facility] Adult PT Treatment/Exercise - 03/17/19 1118      Shoulder Exercises: Standing   Internal Rotation  Right;20 reps;Theraband    Theraband Level (Shoulder Internal Rotation)  Level 4 (Blue)    Other Standing Exercises  standing shoulder extension bar lift off legs 2 x 10; 5 sec hold; AA extension with IR on Rt x20 reps with 3# bar      Shoulder Exercises: ROM/Strengthening   UBE (Upper Arm Bike)  L5 x 4 min (2' each direction)      Shoulder Exercises: Stretch   Internal Rotation Stretch  10 seconds    Internal Rotation Stretch Limitations  10 reps behind back with strap                  PT Long Term Goals - 03/15/19 1414      PT LONG TERM GOAL #1   Title  independent with HEP    Status  On-going    Target Date  04/26/19      PT LONG TERM GOAL #  2   Title  report pain < 6/10 with activity for improved function    Status  Achieved      PT LONG TERM GOAL #3   Title  improve functional IR to at least L1 for improved ADLs/decreased pain with toileting    Status  On-going    Target Date  04/26/19      PT LONG TERM GOAL #4   Title  imrpove Rt shoulder flexion/abduction by at least 15 degrees for improved function    Status  Achieved            Plan - 03/17/19 1155    Clinical Impression Statement  Pt arrived late to session today, and continued focus on functional internal rotation.  Overall slight improvements in FIR noted.  Will contiue to benefit from PT to maximize function.    Personal Factors and Comorbidities  Age;Comorbidity 3+;Past/Current Experience    Comorbidities  DM with neuropathy, obesity, depression, HTN    Examination-Activity Limitations  Lift;Reach Overhead;Carry;Sleep;Hygiene/Grooming;Dressing    Examination-Participation Restrictions  Driving    Stability/Clinical Decision Making  Evolving/Moderate complexity    Rehab Potential  Good     PT Frequency  1x / week    PT Duration  4 weeks    PT Treatment/Interventions  ADLs/Self Care Home Management;Cryotherapy;Electrical Stimulation;Ultrasound;Moist Heat;Iontophoresis 4mg /ml Dexamethasone;Functional mobility training;Therapeutic activities;Therapeutic exercise;Patient/family education;Neuromuscular re-education;Manual techniques;Taping;Dry needling;Passive range of motion    PT Next Visit Plan  update HEP for IR, continue to focus on this as currently pt's only goal and limitation; needs auth for more visits    PT Home Exercise Plan  Access Code: [email or web address]    Consulted and Agree with Plan of Care  Patient       Patient will benefit from skilled therapeutic intervention in order to improve the following deficits and impairments:  Increased fascial restricitons, Increased muscle spasms, Pain, Postural dysfunction, Decreased range of motion, Decreased strength, Impaired UE functional use  Visit Diagnosis: Chronic right shoulder pain  Stiffness of right shoulder, not elsewhere classified  Abnormal posture     Problem List Patient Active Problem List   Diagnosis Date Noted  . Chronic right shoulder pain 09/22/2018  . Breast pain, right 06/21/2018  . Type 2 diabetes mellitus with complication, without long-term current use of insulin ([place]) 03/29/2018  . Vitamin D deficiency 11/25/2017  . Essential hypertension, benign 09/22/2017  . Mixed hyperlipidemia 09/22/2017  . Abdominal pain 08/04/2017  . Elevated lipase 08/04/2017  . Uncontrolled type 2 diabetes mellitus with hyperglycemia ([place]) 08/04/2017  . Heart murmur 08/04/2017  . Schatzki's ring 08/04/2017  . Swelling of right foot 07/08/2017  . Dysuria 07/08/2017  . Left thigh pain 06/11/2017  . Proximal limb muscle weakness 04/01/2017  . Type 2 diabetes mellitus with diabetic neuropathy ([place]) 04/01/2017  . Chronic diarrhea 04/01/2017  . Lumbar disc disease 03/17/2017  . Chronic painful diabetic neuropathy ([place])  06/20/2014      Laureen Abrahams, PT, DPT 03/17/19 11:57 AM     [facility] Physical Therapy [address] [place], [place], [address] Phone: [phone]   Fax:  [phone]  Name: Mallarie Voorhies MRN: [id number] Date of Birth: 07-26-1950

## 2019-03-22 ENCOUNTER — Ambulatory Visit (INDEPENDENT_AMBULATORY_CARE_PROVIDER_SITE_OTHER): Payer: Medicare HMO | Admitting: Physical Therapy

## 2019-03-22 ENCOUNTER — Other Ambulatory Visit: Payer: Self-pay

## 2019-03-22 ENCOUNTER — Encounter: Payer: Self-pay | Admitting: Physical Therapy

## 2019-03-22 DIAGNOSIS — R293 Abnormal posture: Secondary | ICD-10-CM

## 2019-03-22 DIAGNOSIS — M25611 Stiffness of right shoulder, not elsewhere classified: Secondary | ICD-10-CM | POA: Diagnosis not present

## 2019-03-22 DIAGNOSIS — M25511 Pain in right shoulder: Secondary | ICD-10-CM | POA: Diagnosis not present

## 2019-03-22 DIAGNOSIS — G8929 Other chronic pain: Secondary | ICD-10-CM | POA: Diagnosis not present

## 2019-03-22 NOTE — Therapy (Signed)
[facility] Physical Therapy [address] [place], [place], [address] Phone: [phone]   Fax:  [phone]  Physical Therapy Treatment  Patient Details  Name: Destiny Hurley MRN: [id number] Date of Birth: 1950-03-25 Referring Provider (PT): Dr. Apolonio Schneiders   Encounter Date: 03/22/2019  PT End of Session - 03/22/19 1414    Visit Number  12    Number of Visits  16    Date for PT Re-Evaluation  04/26/19   extended due to missed weeks   PT Start Time  1315    PT Stop Time  1355    PT Time Calculation (min)  40 min    Activity Tolerance  Patient tolerated treatment well    Behavior During Therapy  [facility] for tasks assessed/performed       Past Medical History:  Diagnosis Date  . Adrenal mass ([place]) 2012   [place] pen on CT  . Chronic painful diabetic neuropathy ([place])   . Depression    situational  . Diabetes mellitus   . Dysplasia of tongue   . Esophageal mass   . Heart murmur    Echo 2/09 revealing aortic sclerosis without gradient  . Hyperlipemia   . Hypertension   . Left shoulder pain   . Pancreatitis 07/30/2017   Lipase critically elevated, greater than 3x normal    Past Surgical History:  Procedure Laterality Date  . CHOLECYSTECTOMY    . COLONOSCOPY    . COLONOSCOPY WITH PROPOFOL N/A 05/05/2017   Procedure: COLONOSCOPY WITH PROPOFOL;  Surgeon: Toledo, Benay Pike, MD;  Location: [facility] ENDOSCOPY;  Service: Gastroenterology;  Laterality: N/A;  . EUS N/A 12/22/2017   Procedure: FULL UPPER ENDOSCOPIC ULTRASOUND (EUS) RADIAL;  Surgeon: Arta Silence, MD;  Location: [facility] ENDOSCOPY;  Service: Endoscopy;  Laterality: N/A;  . schatzki's ring     s/p dilation 2/04  . TUBAL LIGATION      There were no vitals filed for this visit.  Subjective Assessment - 03/22/19 1317    Subjective  pain is better; feels reaching behind back is a little better    Patient Stated Goals  be able to squat and get back up, "if I fall get back up," sleep on Rt side    Currently in Pain?  No/denies                       [facility] Adult PT Treatment/Exercise - 03/22/19 1317      Shoulder Exercises: Standing   Other Standing Exercises  standing shoulder extension bar lift off legs 2 x 10; 5 sec hold; AA extension with IR on Rt x20 reps with 3# bar      Shoulder Exercises: Pulleys   Other Pulley Exercises  internal rotation x 3 min      Shoulder Exercises: ROM/Strengthening   UBE (Upper Arm Bike)  L6 x 4 min (2' each direction)    [place]  IR x 15 reps      Moist Heat Therapy   Number Minutes Moist Heat  15 Minutes    Moist Heat Location  Shoulder   with manual     Manual Therapy   Manual Therapy  Passive ROM;Joint mobilization    Joint Mobilization  Rt inf and P/A grades 2-3    Passive ROM  Rt shoulder IR/extension                  PT Long Term Goals - 03/15/19 1414      PT  LONG TERM GOAL #1   Title  independent with HEP    Status  On-going    Target Date  04/26/19      PT LONG TERM GOAL #2   Title  report pain < 6/10 with activity for improved function    Status  Achieved      PT LONG TERM GOAL #3   Title  improve functional IR to at least L1 for improved ADLs/decreased pain with toileting    Status  On-going    Target Date  04/26/19      PT LONG TERM GOAL #4   Title  imrpove Rt shoulder flexion/abduction by at least 15 degrees for improved function    Status  Achieved            Plan - 03/22/19 1414    Clinical Impression Statement  Pt overall reports almost no pain and slow progress noted with functional internal rotation.  She has fairly extensive HEP to work on [employer] limitations, and she is considering d/c at next visit.  Will plan to see what pt says.    Personal Factors and Comorbidities  Age;Comorbidity 3+;Past/Current Experience    Comorbidities  DM with neuropathy, obesity, depression, HTN    Examination-Activity Limitations  Lift;Reach Overhead;Carry;Sleep;Hygiene/Grooming;Dressing     Examination-Participation Restrictions  Driving    Stability/Clinical Decision Making  Evolving/Moderate complexity    Rehab Potential  Good    PT Frequency  1x / week    PT Duration  4 weeks    PT Treatment/Interventions  ADLs/Self Care Home Management;Cryotherapy;Electrical Stimulation;Ultrasound;Moist Heat;Iontophoresis 4mg /ml Dexamethasone;Functional mobility training;Therapeutic activities;Therapeutic exercise;Patient/family education;Neuromuscular re-education;Manual techniques;Taping;Dry needling;Passive range of motion    PT Next Visit Plan  consider d/c if pt requests; work on functional IR    PT Home Exercise Plan  Access Code: [email or web address]    Consulted and Agree with Plan of Care  Patient       Patient will benefit from skilled therapeutic intervention in order to improve the following deficits and impairments:  Increased fascial restricitons, Increased muscle spasms, Pain, Postural dysfunction, Decreased range of motion, Decreased strength, Impaired UE functional use  Visit Diagnosis: Chronic right shoulder pain  Stiffness of right shoulder, not elsewhere classified  Abnormal posture     Problem List Patient Active Problem List   Diagnosis Date Noted  . Chronic right shoulder pain 09/22/2018  . Breast pain, right 06/21/2018  . Type 2 diabetes mellitus with complication, without long-term current use of insulin (HCC) 03/29/2018  . Vitamin D deficiency 11/25/2017  . Essential hypertension, benign 09/22/2017  . Mixed hyperlipidemia 09/22/2017  . Abdominal pain 08/04/2017  . Elevated lipase 08/04/2017  . Uncontrolled type 2 diabetes mellitus with hyperglycemia (HCC) 08/04/2017  . Heart murmur 08/04/2017  . Schatzki's ring 08/04/2017  . Swelling of right foot 07/08/2017  . Dysuria 07/08/2017  . Left thigh pain 06/11/2017  . Proximal limb muscle weakness 04/01/2017  . Type 2 diabetes mellitus with diabetic neuropathy (HCC) 04/01/2017  . Chronic diarrhea 04/01/2017  .  Lumbar disc disease 03/17/2017  . Chronic painful diabetic neuropathy (HCC) 06/20/2014      08/20/2014, PT, DPT 03/22/19 2:17 PM     [facility] Physical Therapy [address] [place], [place], [place] Phone: [phone]   Fax:  [phone]  Name: Destiny Hurley MRN: Ashley Royalty Date of Birth: April 15, 1950

## 2019-03-23 ENCOUNTER — Other Ambulatory Visit: Payer: Self-pay | Admitting: "Endocrinology

## 2019-03-24 ENCOUNTER — Encounter: Payer: Medicare HMO | Admitting: Physical Therapy

## 2019-03-27 ENCOUNTER — Ambulatory Visit: Payer: Medicare HMO | Admitting: Physical Therapy

## 2019-03-27 ENCOUNTER — Encounter: Payer: Self-pay | Admitting: Physical Therapy

## 2019-03-27 ENCOUNTER — Other Ambulatory Visit: Payer: Self-pay

## 2019-03-27 ENCOUNTER — Other Ambulatory Visit: Payer: Self-pay | Admitting: "Endocrinology

## 2019-03-27 ENCOUNTER — Other Ambulatory Visit: Payer: Self-pay | Admitting: Family Medicine

## 2019-03-27 DIAGNOSIS — R293 Abnormal posture: Secondary | ICD-10-CM | POA: Diagnosis not present

## 2019-03-27 DIAGNOSIS — M25611 Stiffness of right shoulder, not elsewhere classified: Secondary | ICD-10-CM | POA: Diagnosis not present

## 2019-03-27 DIAGNOSIS — M25511 Pain in right shoulder: Secondary | ICD-10-CM

## 2019-03-27 DIAGNOSIS — G8929 Other chronic pain: Secondary | ICD-10-CM | POA: Diagnosis not present

## 2019-03-27 NOTE — Therapy (Signed)
[facility] Physical Therapy [address] [place], [place], [address] Phone: [phone]   Fax:  [phone]  Physical Therapy Treatment/Discharge Summary  Patient Details  Name: Destiny Hurley MRN: [id number] Date of Birth: February 05, 1950 Referring Provider (PT): Dr. Apolonio Schneiders   Encounter Date: 03/27/2019  PT End of Session - 03/27/19 1430    Visit Number  13    Number of Visits  16    Date for PT Re-Evaluation  04/26/19   extended due to missed weeks   PT Start Time  1400    PT Stop Time  1428    PT Time Calculation (min)  28 min    Activity Tolerance  Patient tolerated treatment well    Behavior During Therapy  [facility] for tasks assessed/performed       Past Medical History:  Diagnosis Date  . Adrenal mass ([place]) 2012   [place] pen on CT  . Chronic painful diabetic neuropathy ([place])   . Depression    situational  . Diabetes mellitus   . Dysplasia of tongue   . Esophageal mass   . Heart murmur    Echo 2/09 revealing aortic sclerosis without gradient  . Hyperlipemia   . Hypertension   . Left shoulder pain   . Pancreatitis 07/30/2017   Lipase critically elevated, greater than 3x normal    Past Surgical History:  Procedure Laterality Date  . CHOLECYSTECTOMY    . COLONOSCOPY    . COLONOSCOPY WITH PROPOFOL N/A 05/05/2017   Procedure: COLONOSCOPY WITH PROPOFOL;  Surgeon: Toledo, Benay Pike, MD;  Location: [facility] ENDOSCOPY;  Service: Gastroenterology;  Laterality: N/A;  . EUS N/A 12/22/2017   Procedure: FULL UPPER ENDOSCOPIC ULTRASOUND (EUS) RADIAL;  Surgeon: Arta Silence, MD;  Location: [facility];  Service: Endoscopy;  Laterality: N/A;  . schatzki's ring     s/p dilation 2/04  . TUBAL LIGATION      There were no vitals filed for this visit.  Subjective Assessment - 03/27/19 1401    Subjective  doing well; shoulder has been hurting since last session.    Patient Stated Goals  be able to squat and get back up, "if I fall get back up," sleep on Rt  side    Currently in Pain?  No/denies         [facility] PT Assessment - 03/27/19 1429      Assessment   Medical Diagnosis  Rt shoulder pain    Referring Provider (PT)  Dr. Apolonio Schneiders      AROM   Right Shoulder Internal Rotation  --   FIR to Rt L4                  [facility] Adult PT Treatment/Exercise - 03/27/19 1403      Shoulder Exercises: Standing   Row  Both;20 reps;Theraband    Theraband Level (Shoulder Row)  Level 4 (Blue)    Other Standing Exercises  standing internal rotation with cane 2x15; standing extension with cane 2x15      Shoulder Exercises: ROM/Strengthening   UBE (Upper Arm Bike)  L6 x 5 min (2.5' each direction)             PT Education - 03/27/19 1430    Education Details  HEP    Person(s) Educated  Patient    Methods  Explanation;Demonstration;Handout    Comprehension  Verbalized understanding;Returned demonstration          PT Long Term Goals - 03/27/19 1430  PT LONG TERM GOAL #1   Title  independent with HEP    Status  Achieved      PT LONG TERM GOAL #2   Title  report pain < 6/10 with activity for improved function    Status  Achieved      PT LONG TERM GOAL #3   Title  improve functional IR to at least L1 for improved ADLs/decreased pain with toileting    Status  Not Met      PT LONG TERM GOAL #4   Title  imrpove Rt shoulder flexion/abduction by at least 15 degrees for improved function    Status  Achieved            Plan - 03/27/19 1430    Clinical Impression Statement  Pt has met 3 of 4 LTGs and has demonstrated progress towards FIR just not met to goal.  At this time pt has HEP to address ROM limitations and is ready for d/c.  Recommended she talk with PCP about possible referral to orthopedics for possible injection to help with shoulder.    Personal Factors and Comorbidities  Age;Comorbidity 3+;Past/Current Experience    Comorbidities  DM with neuropathy, obesity, depression, HTN    Examination-Activity  Limitations  Lift;Reach Overhead;Carry;Sleep;Hygiene/Grooming;Dressing    Examination-Participation Restrictions  Driving    Stability/Clinical Decision Making  Evolving/Moderate complexity    Rehab Potential  Good    PT Frequency  1x / week    PT Duration  4 weeks    PT Treatment/Interventions  ADLs/Self Care Home Management;Cryotherapy;Electrical Stimulation;Ultrasound;Moist Heat;Iontophoresis 79m/ml Dexamethasone;Functional mobility training;Therapeutic activities;Therapeutic exercise;Patient/family education;Neuromuscular re-education;Manual techniques;Taping;Dry needling;Passive range of motion    PT Next Visit Plan  d/c PT today    PT Home Exercise Plan  Access Code: [email or web address]    [email or web address] Agree with Plan of Care  Patient       Patient will benefit from skilled therapeutic intervention in order to improve the following deficits and impairments:  Increased fascial restricitons, Increased muscle spasms, Pain, Postural dysfunction, Decreased range of motion, Decreased strength, Impaired UE functional use  Visit Diagnosis: Chronic right shoulder pain  Stiffness of right shoulder, not elsewhere classified  Abnormal posture     Problem List Patient Active Problem List   Diagnosis Date Noted  . Chronic right shoulder pain 09/22/2018  . Breast pain, right 06/21/2018  . Type 2 diabetes mellitus with complication, without long-term current use of insulin ([place] 03/29/2018  . Vitamin D deficiency 11/25/2017  . Essential hypertension, benign 09/22/2017  . Mixed hyperlipidemia 09/22/2017  . Abdominal pain 08/04/2017  . Elevated lipase 08/04/2017  . Uncontrolled type 2 diabetes mellitus with hyperglycemia ([place] 08/04/2017  . Heart murmur 08/04/2017  . Schatzki's ring 08/04/2017  . Swelling of right foot 07/08/2017  . Dysuria 07/08/2017  . Left thigh pain 06/11/2017  . Proximal limb muscle weakness 04/01/2017  . Type 2 diabetes mellitus with diabetic neuropathy ([place] 04/01/2017   . Chronic diarrhea 04/01/2017  . Lumbar disc disease 03/17/2017  . Chronic painful diabetic neuropathy ([place] 06/20/2014      SLaureen Abrahams PT, DPT 03/27/19 2:32 PM     [facility] [address] [address]Phone: [phone]  Fax:  [phone] Name: Destiny RoskelleyMRN: [id number] of Birth: 708-12-1950     PHYSICAL THERAPY DISCHARGE SUMMARY  Visits from Start of Care: 13  Current functional level related to goals / functional outcomes: See above  Remaining deficits: See above   Education / Equipment: HEP  Plan: Patient agrees to discharge.  Patient goals were partially met. Patient is being discharged due to being pleased with the current functional level.  ?????     Laureen Abrahams, PT, DPT 03/27/19 2:33 PM [facility] Physical Therapy [address] [place], [place], [address] Phone: [phone]   Fax:  [phone]

## 2019-03-27 NOTE — Patient Instructions (Signed)
Access Code: [email or web address]  URL: [email or web address]  Date: 03/27/2019  Prepared by: Moshe Cipro   Exercises Standing Shoulder Extension with Dowel - 10 reps - 1 sets - 2x daily - 7x weekly Standing Shoulder Internal Rotation Stretch with Towel - 10 reps - 1 sets - 10 sec hold - 2x daily - 7x weekly Standing Bilateral Shoulder Internal Rotation AAROM with Dowel - 10 reps - 1 sets - 2x daily - 7x weekly Standing Shoulder Internal Rotation AAROM with Dowel - 10 reps                   - 1 sets - 2x daily - 7x weekly Standing Row with Anchored Resistance - 10 reps - 3 sets - 1x daily - 7x weekly Patient Education TENS Unit

## 2019-04-03 ENCOUNTER — Encounter: Payer: Medicare HMO | Admitting: Physical Therapy

## 2019-04-04 ENCOUNTER — Telehealth: Payer: Self-pay | Admitting: Internal Medicine

## 2019-04-04 ENCOUNTER — Ambulatory Visit: Payer: Medicare HMO | Attending: Internal Medicine

## 2019-04-04 DIAGNOSIS — Z23 Encounter for immunization: Secondary | ICD-10-CM

## 2019-04-04 NOTE — Telephone Encounter (Signed)
Left message for pt to call to schedule AWV

## 2019-04-04 NOTE — Progress Notes (Signed)
   Covid-19 Vaccination Clinic  Name:  Emiliana Blaize    MRN: [id number] DOB: Jul 11, 1950  04/04/2019  Ms. Hamblen was observed post Covid-19 immunization for 15 minutes without incident. She was provided with Vaccine Information Sheet and instruction to access the V-Safe system.   Ms. Mcbroom was instructed to call 911 with any severe reactions post vaccine: Marland Kitchen Difficulty breathing  . Swelling of face and throat  . A fast heartbeat  . A bad rash all over body  . Dizziness and weakness   Immunizations Administered    Name Date Dose VIS Date Route   Pfizer COVID-19 Vaccine 04/04/2019 12:32 PM 0.3 mL 12/30/2018 Intramuscular   Manufacturer: [employer], [employer]   Lot: [id number]   NDC: [address]-7

## 2019-04-05 ENCOUNTER — Other Ambulatory Visit: Payer: Self-pay | Admitting: Internal Medicine

## 2019-04-05 DIAGNOSIS — Z13820 Encounter for screening for osteoporosis: Secondary | ICD-10-CM

## 2019-04-06 ENCOUNTER — Encounter: Payer: Medicare HMO | Admitting: Physical Therapy

## 2019-04-10 ENCOUNTER — Other Ambulatory Visit: Payer: Self-pay | Admitting: Family Medicine

## 2019-04-10 ENCOUNTER — Encounter: Payer: Medicare HMO | Admitting: Physical Therapy

## 2019-04-13 ENCOUNTER — Encounter: Payer: Medicare HMO | Admitting: Physical Therapy

## 2019-05-11 ENCOUNTER — Other Ambulatory Visit: Payer: Self-pay | Admitting: "Endocrinology

## 2019-05-11 DIAGNOSIS — E118 Type 2 diabetes mellitus with unspecified complications: Secondary | ICD-10-CM

## 2019-05-25 ENCOUNTER — Encounter: Payer: Self-pay | Admitting: "Endocrinology

## 2019-05-25 ENCOUNTER — Ambulatory Visit (INDEPENDENT_AMBULATORY_CARE_PROVIDER_SITE_OTHER): Payer: Medicare HMO | Admitting: "Endocrinology

## 2019-05-25 ENCOUNTER — Other Ambulatory Visit: Payer: Self-pay

## 2019-05-25 VITALS — BP 129/78 | HR 86 | Ht 65.0 in | Wt 220.8 lb

## 2019-05-25 DIAGNOSIS — E782 Mixed hyperlipidemia: Secondary | ICD-10-CM

## 2019-05-25 DIAGNOSIS — E118 Type 2 diabetes mellitus with unspecified complications: Secondary | ICD-10-CM

## 2019-05-25 DIAGNOSIS — I10 Essential (primary) hypertension: Secondary | ICD-10-CM | POA: Diagnosis not present

## 2019-05-25 LAB — POCT GLYCOSYLATED HEMOGLOBIN (HGB A1C): Hemoglobin A1C: 9.4 % — AB (ref 4.0–5.6)

## 2019-05-25 MED ORDER — LANTUS SOLOSTAR 100 UNIT/ML ~~LOC~~ SOPN: 50.0000 [IU] | PEN_INJECTOR | Freq: Every day | SUBCUTANEOUS | 2 refills | Status: DC

## 2019-05-25 MED ORDER — LANTUS SOLOSTAR 100 UNIT/ML ~~LOC~~ SOPN: 50.0000 [IU] | PEN_INJECTOR | Freq: Every day | SUBCUTANEOUS | 0 refills | Status: AC

## 2019-05-25 NOTE — Patient Instructions (Signed)

## 2019-05-25 NOTE — Progress Notes (Signed)
05/25/2019, 5:47 PM            Endocrinology follow-up note   Subjective:    Patient ID: Destiny Hurley, female    DOB: 1950/03/26.  Destiny Hurley is being seen in follow-up for the management of currently uncontrolled symptomatic  type 2 diabetes, hyperlipidemia, hypertension. PCP:  Avanell Shackleton, NP-C.   Past Medical History:  Diagnosis Date  . Adrenal mass (HCC) 2012   Destiny Hurley on CT  . Chronic painful diabetic neuropathy (HCC)   . Depression    situational  . Diabetes mellitus   . Dysplasia of tongue   . Esophageal mass   . Heart murmur    Echo 2/09 revealing aortic sclerosis without gradient  . Hyperlipemia   . Hypertension   . Left shoulder pain   . Pancreatitis 07/30/2017   Lipase critically elevated, greater than 3x normal   Past Surgical History:  Procedure Laterality Date  . CHOLECYSTECTOMY    . COLONOSCOPY    . COLONOSCOPY WITH PROPOFOL N/A 05/05/2017   Procedure: COLONOSCOPY WITH PROPOFOL;  Surgeon: Toledo, Boykin Nearing, MD;  Location: [facility] END[facility]COPY;  Service: Gastroenterology;  Laterality: N/A;  . EUS N/A 12/22/2017   Procedure: FULL UPPER ENDOSCOPIC ULTRASOUND (EUS) RADIAL;  Surgeon: Willis Modena, MD;  Location: [facility];  Service: Endoscopy;  Laterality: N/A;  . schatzki's ring     s/p dilation 2/04  . TUBAL LIGATION     Social History   Socioeconomic History  . Marital status: Divorced    Spouse name: Not on file  . Number of children: Not on file  . Years of education: Not on file  . Highest education level: Not on file  Occupational History  . Not on file  Tobacco Use  . Smoking status: Never Smoker  . Smokeless tobacco: Current User    Types: Chew  . Tobacco comment: Chews 1 pack per day  Substance and Sexual Activity  . Alcohol use: Never  . Drug use: No  . Sexual activity: Not Currently  Other Topics Concern  . Not on file  Social  History Narrative  . Not on file   Social Determinants of Health   Financial Resource Strain:   . Difficulty of Paying Living Expenses:   Food Insecurity:   . Worried About Programme researcher, broadcasting/film/video in the Last Year:   . Barista in the Last Year:   Transportation Needs:   . Freight forwarder (Medical):   Marland Kitchen Lack of Transportation (Non-Medical):   Physical Activity:   . Days of Exercise per Week:   . Minutes of Exercise per Session:   Stress:   . Feeling of Stress :   Social Connections:   . Frequency of Communication with Friends and Family:   . Frequency of Social Gatherings with Friends and Family:   . Attends Religious Services:   . Active Member of Clubs or Organizations:   . Attends Banker Meetings:   Marland Kitchen Marital Status:    Outpatient Encounter Medications as of 05/25/2019  Medication Sig  . ACCU-CHEK AVIVA PLUS test strip TEST  BLOOD SUGAR TWICE DAILY  . acetaminophen (TYLENOL) 500 MG tablet Take 1,000 mg by mouth every 6 (six) hours as needed for moderate pain or headache.  Marland Kitchen amLODipine (NORVASC) 10 MG tablet TAKE 1 TABLET EVERY DAY  . Cholecalciferol (VITAMIN D3) 125 MCG (5000 UT) CAPS Take 1 capsule (5,000 Units total) by mouth daily.  . diclofenac sodium (VOLTAREN) 1 % GEL Apply 2 g topically 4 (four) times daily.  . furosemide (LASIX) 20 MG tablet 1 tablet daily.  Marland Kitchen gabapentin (NEURONTIN) 600 MG tablet TAKE 1 TABLET TWICE DAILY  . glipiZIDE (GLUCOTROL XL) 5 MG 24 hr tablet Take 1 tablet (5 mg total) by mouth daily with breakfast.  . insulin glargine (LANTUS SOLOSTAR) 100 UNIT/ML Solostar Hurley Inject 50 Units into the skin at bedtime.  Marland Kitchen lisinopril (ZESTRIL) 20 MG tablet TAKE 1 TABLET EVERY DAY  . lovastatin (MEVACOR) 20 MG tablet TAKE 1 TABLET EVERY DAY  . metFORMIN (GLUCOPHAGE) 1000 MG tablet TAKE 1 TABLET TWICE DAILY  . pantoprazole (PROTONIX) 40 MG tablet TAKE 1 TABLET TWICE DAILY  . Probiotic Product (ALIGN PO) Take by mouth.  . vitamin B-12  (CYANOCOBALAMIN) 500 MCG tablet Take 500 mcg by mouth daily.  . vitamin C (ASCORBIC ACID) 500 MG tablet Take 500 mg by mouth daily.  . [DISCONTINUED] gabapentin (NEURONTIN) 300 MG capsule Take 1 capsule (300 mg total) by mouth 2 (two) times daily.  . [DISCONTINUED] insulin glargine (LANTUS SOLOSTAR) 100 UNIT/ML Solostar Hurley Inject 50 Units into the skin at bedtime.  . [DISCONTINUED] LANTUS SOLOSTAR 100 UNIT/ML Solostar Hurley INJECT 40 UNITS INTO THE SKIN AT BEDTIME.   No facility-administered encounter medications on file as of 05/25/2019.    ALLERGIES: Allergies  Allergen Reactions  . Duloxetine Rash  . Sitagliptin Other (See Comments)    pancreatitis  . Dulaglutide Nausea And Vomiting and Nausea Only    Trulicity   . Hydrocodone Other (See Comments)    Edema   . Lyrica [Pregabalin] Other (See Comments)    Edema    VACCINATION STATUS: Immunization History  Administered Date(s) Administered  . Fluad Quad(high Dose 65+) 11/21/2018  . PFIZER SARS-COV-2 Vaccination 03/11/2019, 04/04/2019    Diabetes She presents for her follow-up diabetic visit. She has type 2 diabetes mellitus. Onset time: She was diagnosed at approximate age of 89 years. Her disease course has been worsening. There are no hypoglycemic associated symptoms. Pertinent negatives for hypoglycemia include no confusion, headaches, pallor or seizures. Associated symptoms include polydipsia and polyuria. Pertinent negatives for diabetes include no chest pain, no fatigue and no polyphagia. There are no hypoglycemic Hurley. Symptoms are worsening. There are no diabetic Hurley. Risk factors for coronary artery disease include diabetes mellitus, dyslipidemia, family history, hypertension, obesity, sedentary lifestyle, post-menopausal and tobacco exposure. Current diabetic treatment includes oral agent (dual therapy). Compliance with diabetes treatment: She is currently on metformin 500 mg p.o. twice daily, Lantus 40 units  nightly, glipizide 5 mg daily. Her weight is increasing steadily. She is following a generally unhealthy diet. When asked about meal planning, she reported none. She has not had a previous visit with a dietitian (Patient declined a consult.). She rarely participates in exercise. Her home blood glucose trend is increasing steadily. Her breakfast blood glucose range is generally 180-200 mg/dl. Her bedtime blood glucose range is generally >200 mg/dl. Her overall blood glucose range is 180-200 mg/dl. (She returns with loss of control in her glycemia ranging between 180-225 mg per DL.  Her point-of-care A1c is 9.4%.   )  An ACE inhibitor/angiotensin II receptor blocker is being taken. Eye exam is not current.  Hyperlipidemia This is a chronic problem. The current episode started more than 1 year ago. The problem is controlled. Exacerbating diseases include diabetes and obesity. Pertinent negatives include no chest pain, myalgias or shortness of breath. Current antihyperlipidemic treatment includes statins. Risk factors for coronary artery disease include dyslipidemia, diabetes mellitus, hypertension, a sedentary lifestyle, post-menopausal and obesity.  Hypertension This is a chronic problem. The current episode started more than 1 year ago. The problem is controlled. Pertinent negatives include no chest pain, headaches, palpitations or shortness of breath. Risk factors for coronary artery disease include dyslipidemia, diabetes mellitus, obesity, sedentary lifestyle, smoking/tobacco exposure, family history and post-menopausal state. Past treatments include ACE inhibitors.     Review of systems  Constitutional: + Minimally fluctuating body weight,  current  Body mass index is 36.74 kg/m. , no fatigue, no subjective hyperthermia, no subjective hypothermia Eyes: no blurry vision, no xerophthalmia ENT: no sore throat, no nodules palpated in throat, no dysphagia/odynophagia, no hoarseness Cardiovascular: no  Chest Pain, no Shortness of Breath, no palpitations, no leg swelling Respiratory: no cough, no shortness of breath Gastrointestinal: no Nausea/Vomiting/Diarhhea Musculoskeletal: no muscle/joint aches Skin: no rashes, no hyperemia Neurological: no tremors, no numbness, no tingling, no dizziness Psychiatric: no depression, no anxiety   Objective:    BP 129/78   Pulse 86   Ht 5\' 5"  (1.651 m)   Wt 220 lb 12.8 oz (100.2 kg)   BMI 36.74 kg/m   Wt Readings from Last 3 Encounters:  05/25/19 220 lb 12.8 oz (100.2 kg)  11/21/18 208 lb 3.2 oz (94.4 kg)  10/24/18 205 lb (93 kg)      Physical Exam- Limited  Constitutional:  Body mass index is 36.74 kg/m. , not in acute distress, normal state of mind Eyes:  EOMI, no exophthalmos Neck: Supple Thyroid: No gross goiter Respiratory: Adequate breathing efforts Musculoskeletal: no gross deformities, strength intact in all four extremities, no gross restriction of joint movements Skin:  no rashes, no hyperemia Neurological: no tremor with outstretched hands,   CMP ( most recent) CMP     Component Value Date/Time   NA 141 11/07/2018 0818   NA 141 06/11/2017 1655   K 3.6 11/07/2018 0818   CL 100 11/07/2018 0818   CO2 31 11/07/2018 0818   GLUCOSE 255 (H) 11/07/2018 0818   BUN 13 11/07/2018 0818   BUN 16 06/11/2017 1655   CREATININE 0.81 11/07/2018 0818   CALCIUM 9.7 11/07/2018 0818   PROT 6.6 11/07/2018 0818   PROT 7.6 06/11/2017 1655   ALBUMIN 4.1 07/30/2017 2157   ALBUMIN 5.0 (H) 06/11/2017 1655   AST 10 11/07/2018 0818   ALT 11 11/07/2018 0818   ALKPHOS 105 07/30/2017 2157   BILITOT 0.3 11/07/2018 0818   BILITOT <0.2 06/11/2017 1655   GFRNONAA 76 11/07/2018 0818   GFRAA 88 11/07/2018 0818    Diabetic Labs (most recent): Lab Results  Component Value Date   HGBA1C 9.4 (A) 05/25/2019   HGBA1C 9.6 (H) 11/07/2018   HGBA1C 8.5 (H) 08/01/2018    Lipid Panel     Component Value Date/Time   CHOL 148 11/19/2017 1009   TRIG  130 11/19/2017 1009   HDL 66 11/19/2017 1009   CHOLHDL 2.2 11/19/2017 1009   LDLCALC 61 11/19/2017 1009     Assessment & Plan:   1. Uncontrolled type 2 diabetes mellitus with hyperglycemia (HCC)  - 13/01/2017 Destiny Hurley has  currently uncontrolled symptomatic type 2 DM since 69 years of age.   She returns with loss of control in her glycemia ranging between 180-225 mg per DL.  Her point-of-care A1c is 9.4%.    -She did not report gross Hurley from her diabetes, however Destiny Hurley which include CAD, CVA, CKD, retinopathy, and neuropathy. These are all discussed in detail with the patient.  - I have counseled her on diet management and weight loss, by adopting a carbohydrate restricted/protein rich diet.  -She still admits to dietary discretion including consumption of sweetened beverages. - she  admits there is a room for improvement in her diet and drink choices. -  Suggestion is made for her to avoid simple carbohydrates  from her diet including Cakes, Sweet Desserts / Pastries, Ice Cream, Soda (diet and regular), Sweet Tea, Candies, Chips, Cookies, Sweet Pastries,  Store Bought Juices, Alcohol in Excess of  1-2 drinks a day, Artificial Sweeteners, Coffee Creamer, and "Sugar-free" Products. This will help patient to have stable blood glucose profile and potentially avoid unintended weight gain.   - I encouraged her to switch to  unprocessed or minimally processed complex starch and increased protein intake (animal or plant source), fruits, and vegetables.  - she is advised to stick to a routine mealtimes to eat 3 meals  a day and avoid unnecessary snacks ( to snack only to correct hypoglycemia).   - I have approached her with the following individualized plan to manage diabetes and patient agrees:   -She has previously responded to basal insulin, Metformin, and glipizide treatment.   -She promises to  reengage, and advised to resume increase Lantus to 50  units nightly, continue strict monitoring of blood glucose twice a day-daily before breakfast and at bedtime.  She will also continue her metformin 1000 mg p.o. twice daily after breakfast and after supper.   -She continue to benefit from low-dose glipizide therapy.  She is advised to continue glipizide 5 mg XL p.o. daily at breakfast.    -Patient is encouraged to call clinic for blood glucose levels less than 70 or above 200 mg /dl.  - she is not a suitable candidate for incretin therapy given recent history of pancreatitis.    - Patient specific target  A1c;  LDL, HDL, Triglycerides,  were discussed in detail.  2) BP/HTN:  Her blood pressure is controlled to target.  She is advised to continue her current blood pressure medications including lisinopril 20 mg daily, amlodipine 10 mg daily.  3) Lipids/HPL:   She has hypertriglyceridemia-improving to 130, history of pancreatitis.  Her recent lipid panel showed controlled LDL 61.   She will continue to benefit from statin therapy.  She is advised to continue lovastatin 20 mg p.o. nightly.    4)  Weight/Diet: Her BMI 81.8-EXHBZJI complicating her diabetes care.  She is a candidate for modest weight loss.  CDE Consult is declined by the patient,  exercise, and detailed carbohydrates information provided.  5) Chronic Care/Health Maintenance:  -she  is on ACEI/ARB and Statin medications and  is encouraged to initiate and continue to follow up with Ophthalmology, Dentist,  Podiatrist at least yearly or according to recommendations, and advised to quit tobacco products. I have recommended yearly flu vaccine and pneumonia vaccine at least every 5 years; moderate intensity exercise for up to 150 minutes weekly; and  sleep for at least 7 hours a day.  - I  advised patient to maintain close follow up with Avanell ShackletonHenson, Vickie L, NP-C for primary care needs.   - Time spent on this patient care encounter:  35  min, of which > 50% was spent in  counseling and the rest reviewing her blood glucose logs , discussing her hypoglycemia and hyperglycemia episodes, reviewing her current and  previous labs / studies  ( including abstraction from other facilities) and medications  doses and developing a  long term treatment plan and documenting her care.   Please refer to Patient Instructions for Blood Glucose Monitoring and Insulin/Medications Dosing Guide"  in media tab for additional information. Please  also refer to " Patient Self Inventory" in the Media  tab for reviewed elements of pertinent patient history.  Destiny Hurley, expressed understanding, and voiced agreement with the above plans.  All questions were answered to her satisfaction. she is encouraged to contact clinic should she have any questions or concerns prior to her return visit.   Follow up plan: - Return in about 4 months (around 09/25/2019) for Bring Meter and Logs- A1c in Office, Follow up with Pre-visit Labs.  Marquis LunchGebre Amairany Schumpert, MD [facility] [address] [place], [place] [address] Phone: [phone]  Fax: [phone]    05/25/2019, 5:47 PM  This note was partially dictated with voice recognition software. Similar sounding words can be transcribed inadequately or may not  be corrected upon review.

## 2019-06-10 ENCOUNTER — Telehealth: Payer: Self-pay | Admitting: "Endocrinology

## 2019-06-12 MED ORDER — GLIPIZIDE ER 5 MG PO TB24: 5.0000 mg | ORAL_TABLET | Freq: Every day | ORAL | 0 refills | Status: AC

## 2019-06-12 NOTE — Telephone Encounter (Signed)
Pt states pharmacy did not receive her glipizide. Can you send again?

## 2019-06-12 NOTE — Addendum Note (Signed)
Addended by: Derrell Lolling on: 06/12/2019 05:11 PM   Modules accepted: Orders

## 2019-06-12 NOTE — Telephone Encounter (Signed)
Rx refill resent. 

## 2019-06-21 ENCOUNTER — Ambulatory Visit
Admission: RE | Admit: 2019-06-21 | Discharge: 2019-06-21 | Disposition: A | Discharge status: Home / Self Care | Payer: Medicare HMO | Source: Ambulatory Visit | Attending: Internal Medicine | Admitting: Internal Medicine

## 2019-06-21 ENCOUNTER — Other Ambulatory Visit: Payer: Self-pay

## 2019-06-21 DIAGNOSIS — Z13820 Encounter for screening for osteoporosis: Secondary | ICD-10-CM

## 2019-06-22 ENCOUNTER — Telehealth: Payer: Self-pay | Admitting: "Endocrinology

## 2019-06-22 NOTE — Telephone Encounter (Signed)
Pt states that the lantus is making her sick. She said her toes feel numb, feet are swelling. At night, she just feel so hot. She would like a different insulin. Please advise.

## 2019-06-22 NOTE — Telephone Encounter (Signed)
These symptoms are not from her Lantus.  Advise her to keep the  plan, monitor as ordered and will address on her next visit.

## 2019-06-22 NOTE — Telephone Encounter (Signed)
Pt made aware

## 2019-06-24 ENCOUNTER — Other Ambulatory Visit: Payer: Self-pay | Admitting: "Endocrinology

## 2019-06-24 ENCOUNTER — Other Ambulatory Visit: Payer: Self-pay | Admitting: Family Medicine

## 2019-06-24 DIAGNOSIS — R109 Unspecified abdominal pain: Secondary | ICD-10-CM

## 2019-06-26 ENCOUNTER — Other Ambulatory Visit: Payer: Self-pay | Admitting: "Endocrinology

## 2019-06-26 NOTE — Telephone Encounter (Signed)
Please advise 

## 2019-06-26 NOTE — Telephone Encounter (Signed)
Left message for pt to call back as she is due for AWV. Once scheduled can refill med until appt

## 2019-08-09 ENCOUNTER — Other Ambulatory Visit: Payer: Self-pay | Admitting: "Endocrinology

## 2019-08-09 DIAGNOSIS — E118 Type 2 diabetes mellitus with unspecified complications: Secondary | ICD-10-CM

## 2019-08-15 ENCOUNTER — Other Ambulatory Visit: Payer: Self-pay

## 2019-08-15 ENCOUNTER — Ambulatory Visit: Payer: Medicare HMO | Admitting: Internal Medicine

## 2019-08-15 ENCOUNTER — Encounter: Payer: Self-pay | Admitting: Internal Medicine

## 2019-08-15 VITALS — BP 148/86 | HR 86 | Ht 65.0 in | Wt 230.0 lb

## 2019-08-15 DIAGNOSIS — I10 Essential (primary) hypertension: Secondary | ICD-10-CM

## 2019-08-15 DIAGNOSIS — R0600 Dyspnea, unspecified: Secondary | ICD-10-CM | POA: Diagnosis not present

## 2019-08-15 DIAGNOSIS — E782 Mixed hyperlipidemia: Secondary | ICD-10-CM

## 2019-08-15 DIAGNOSIS — E1140 Type 2 diabetes mellitus with diabetic neuropathy, unspecified: Secondary | ICD-10-CM

## 2019-08-15 DIAGNOSIS — E1165 Type 2 diabetes mellitus with hyperglycemia: Secondary | ICD-10-CM

## 2019-08-15 DIAGNOSIS — R0609 Other forms of dyspnea: Secondary | ICD-10-CM

## 2019-08-15 NOTE — Progress Notes (Signed)
Cardiology Office Note:    Date:  08/15/2019   ID:  Jazma, Pickel 1950/05/03, MRN [id number]  PCP:  Karl Ito, DO  Cardiologist:  Parke Poisson, MD  Electrophysiologist:  None   Referring MD: Karl Ito, DO   Chief Complaint: DOE, leg swelling  History of Present Illness:    Destiny Hurley is a 69 y.o. female with a history of DM2, HTN, HLD  No CP. SOB with mopping the floor, gets short of breath - this has been going on for 1 year.   17 steps at home but only goes once a month bc it's too hard for her, weakness in her legs. Cramping legs at night when stretching at night. Walks 2 miles a day without chest pain, does have DOE.   BP elevated this morning without medicines.   The patient denies chest pain, chest pressure, dyspnea at rest, palpitations, PND, orthopnea. Denies cough, fever, chills. Denies nausea, vomiting. Denies syncope or presyncope. Denies dizziness or lightheadedness. Denies snoring.  Past Medical History:  Diagnosis Date  . Adrenal mass (HCC) 2012   [place] pen on CT  . Chronic painful diabetic neuropathy (HCC)   . Depression    situational  . Diabetes mellitus   . Dysplasia of tongue   . Esophageal mass   . Heart murmur    Echo 2/09 revealing aortic sclerosis without gradient  . Hyperlipemia   . Hypertension   . Left shoulder pain   . Pancreatitis 07/30/2017   Lipase critically elevated, greater than 3x normal    Past Surgical History:  Procedure Laterality Date  . CHOLECYSTECTOMY    . COLONOSCOPY    . COLONOSCOPY WITH PROPOFOL N/A 05/05/2017   Procedure: COLONOSCOPY WITH PROPOFOL;  Surgeon: Toledo, Boykin Nearing, MD;  Location: [facility] ENDOSCOPY;  Service: Gastroenterology;  Laterality: N/A;  . EUS N/A 12/22/2017   Procedure: FULL UPPER ENDOSCOPIC ULTRASOUND (EUS) RADIAL;  Surgeon: Willis Modena, MD;  Location: [facility];  Service: Endoscopy;  Laterality: N/A;  . schatzki's ring     s/p dilation 2/04  . TUBAL LIGATION       Current Medications: Current Meds  Medication Sig  . ACCU-CHEK AVIVA PLUS test strip TEST BLOOD SUGAR TWICE DAILY  . acetaminophen (TYLENOL) 500 MG tablet Take 1,000 mg by mouth every 6 (six) hours as needed for moderate pain or headache.  Marland Kitchen amLODipine (NORVASC) 10 MG tablet TAKE 1 TABLET EVERY DAY  . Cholecalciferol (VITAMIN D3) 125 MCG (5000 UT) CAPS Take 1 capsule (5,000 Units total) by mouth daily.  . diclofenac sodium (VOLTAREN) 1 % GEL Apply 2 g topically 4 (four) times daily.  . furosemide (LASIX) 20 MG tablet 1 tablet daily.  Marland Kitchen gabapentin (NEURONTIN) 600 MG tablet TAKE 1 TABLET TWICE DAILY  . glipiZIDE (GLUCOTROL XL) 5 MG 24 hr tablet Take 1 tablet (5 mg total) by mouth daily with breakfast.  . insulin glargine (LANTUS SOLOSTAR) 100 UNIT/ML Solostar Pen Inject 50 Units into the skin at bedtime.  Marland Kitchen lisinopril (ZESTRIL) 20 MG tablet TAKE 1 TABLET EVERY DAY  . lovastatin (MEVACOR) 20 MG tablet TAKE 1 TABLET EVERY DAY  . metFORMIN (GLUCOPHAGE) 1000 MG tablet TAKE 1 TABLET TWICE DAILY  . pantoprazole (PROTONIX) 40 MG tablet TAKE 1 TABLET TWICE DAILY  . Probiotic Product (ALIGN PO) Take by mouth.  . vitamin B-12 (CYANOCOBALAMIN) 500 MCG tablet Take 500 mcg by mouth daily.  . vitamin C (ASCORBIC ACID) 500 MG tablet Take 500 mg by  mouth daily.     Allergies:   Insulins, Duloxetine, Sitagliptin, Dulaglutide, Hydrocodone, and Lyrica [pregabalin]   Social History   Socioeconomic History  . Marital status: Divorced    Spouse name: Not on file  . Number of children: Not on file  . Years of education: Not on file  . Highest education level: Not on file  Occupational History  . Not on file  Tobacco Use  . Smoking status: Never Smoker  . Smokeless tobacco: Current User    Types: Chew  . Tobacco comment: Chews 1 pack per day  Vaping Use  . Vaping Use: Never used  Substance and Sexual Activity  . Alcohol use: Never  . Drug use: No  . Sexual activity: Not Currently  Other  Topics Concern  . Not on file  Social History Narrative  . Not on file   Social Determinants of Health   Financial Resource Strain:   . Difficulty of Paying Living Expenses:   Food Insecurity:   . Worried About Programme researcher, broadcasting/film/video in the Last Year:   . Barista in the Last Year:   Transportation Needs:   . Freight forwarder (Medical):   Marland Kitchen Lack of Transportation (Non-Medical):   Physical Activity:   . Days of Exercise per Week:   . Minutes of Exercise per Session:   Stress:   . Feeling of Stress :   Social Connections:   . Frequency of Communication with Friends and Family:   . Frequency of Social Gatherings with Friends and Family:   . Attends Religious Services:   . Active Member of Clubs or Organizations:   . Attends Banker Meetings:   Marland Kitchen Marital Status:      Family History: The patient's family history includes Arthritis in an other family member; Cancer in an other family member; Diabetes in an other family member; Heart attack (age of onset: 58) in her sister; Heart disease in an other family member; Kidney disease in an other family member; Lung disease in an other family member.  ROS:   Please see the history of present illness.    All other systems reviewed and are negative.  EKGs/Labs/Other Studies Reviewed:    The following studies were reviewed today:  EKG:  NSR  Recent Labs: 11/07/2018: ALT 11; BUN 13; Creat 0.81; Potassium 3.6; Sodium 141  Recent Lipid Panel    Component Value Date/Time   CHOL 148 11/19/2017 1009   TRIG 130 11/19/2017 1009   HDL 66 11/19/2017 1009   CHOLHDL 2.2 11/19/2017 1009   LDLCALC 61 11/19/2017 1009    Physical Exam:    VS:  BP (!) 148/86   Pulse 86   Ht 5\' 5"  (1.651 m)   Wt (!) 230 lb (104.3 kg)   SpO2 99%   BMI 38.27 kg/m     Wt Readings from Last 5 Encounters:  08/15/19 (!) 230 lb (104.3 kg)  05/25/19 220 lb 12.8 oz (100.2 kg)  11/21/18 208 lb 3.2 oz (94.4 kg)  10/24/18 205 lb (93 kg)   09/22/18 201 lb 6.4 oz (91.4 kg)     Constitutional: No acute distress Eyes: sclera non-icteric, normal conjunctiva and lids ENMT: normal dentition, moist mucous membranes Cardiovascular: regular rhythm, normal rate, no murmurs. S1 and S2 normal. Radial pulses normal bilaterally. No jugular venous distention.  Respiratory: clear to auscultation bilaterally GI : normal bowel sounds, soft and nontender. No distention.   MSK: extremities warm, well perfused.  No edema.  NEURO: grossly nonfocal exam, moves all extremities. PSYCH: alert and oriented x 3, normal mood and affect.   ASSESSMENT:    1. DOE (dyspnea on exertion)   2. Uncontrolled type 2 diabetes mellitus with hyperglycemia (HCC)   3. Essential hypertension   4. Mixed hyperlipidemia   5. Chronic painful diabetic neuropathy (HCC)    PLAN:    DOE (dyspnea on exertion) - Plan: EKG 12-Lead, ECHOCARDIOGRAM COMPLETE -She endorses dyspnea on exertion and tells me it is difficult to complete her house chores or walk due to shortness of breath.  I have offered her an echocardiogram as well as a coronary CTA given her history of diabetes mellitus, considering dyspnea could be anginal equivalent.  We have participated in shared decision making and determined we will proceed with echocardiogram first and discussed results and determine further testing afterward.  Lower extremity edema-she has a history of diabetic neuropathy, and likely venous stasis.  These in combination will complicate lower extremity edema.  If echocardiogram is unremarkable, recommend continued compression stockings since these helped her quite a bit.  Uncontrolled type 2 diabetes mellitus with hyperglycemia (HCC)  Essential hypertension -continue amlodipine 10 mg daily, lisinopril 20 mg daily, furosemide 20 mg daily.  BP is mildly elevated today however patient did not take her medications this morning.  Mixed hyperlipidemia-can continue lovastatin at this time, LDL  is optimized.    Total time of encounter: 45 minutes total time of encounter, including 25 minutes spent in face-to-face patient care on the date of this encounter. This time includes coordination of care and counseling regarding above mentioned problem list. Remainder of non-face-to-face time involved reviewing chart documents/testing relevant to the patient encounter and documentation in the medical record. I have independently reviewed documentation from referring provider.   Weston Brass, MD [place]  [facility]    Medication Adjustments/Labs and Tests Ordered: Current medicines are reviewed at length with the patient today.  Concerns regarding medicines are outlined above.  No orders of the defined types were placed in this encounter.  No orders of the defined types were placed in this encounter.   There are no Patient Instructions on file for this visit.

## 2019-08-15 NOTE — Patient Instructions (Signed)
Medication Instructions:  Your Physician recommend you continue on your current medication as directed.    *If you need a refill on your cardiac medications before your next appointment, please call your pharmacy*   Lab Work: None   Testing/Procedures: Your physician has requested that you have an echocardiogram. Echocardiography is a painless test that uses sound waves to create images of your heart. It provides your doctor with information about the size and shape of your heart and how well your heart's chambers and valves are working. This procedure takes approximately one hour. There are no restrictions for this procedure. [address]    Follow-Up: At [facility], you and your health needs are our priority.  As part of our continuing mission to provide you with exceptional heart care, we have created designated Provider Care Teams.  These Care Teams include your primary Cardiologist (physician) and Advanced Practice Providers (APPs -  Physician Assistants and Nurse Practitioners) who all work together to provide you with the care you need, when you need it.  We recommend signing up for the patient portal called "MyChart".  Sign up information is provided on this After Visit Summary.  MyChart is used to connect with patients for Virtual Visits (Telemedicine).  Patients are able to view lab/test results, encounter notes, upcoming appointments, etc.  Non-urgent messages can be sent to your provider as well.   To learn more about what you can do with MyChart, go to [email or web address].    Your next appointment:   After Echocardiogram  The format for your next appointment:   In Person  Provider:   Weston Brass, MD  If you notice swelling in your legs, please wear your compression stockings.

## 2019-08-17 ENCOUNTER — Other Ambulatory Visit: Payer: Self-pay | Admitting: "Endocrinology

## 2019-08-31 ENCOUNTER — Ambulatory Visit (HOSPITAL_COMMUNITY): Payer: Medicare HMO | Attending: Cardiology

## 2019-08-31 ENCOUNTER — Other Ambulatory Visit: Payer: Self-pay

## 2019-08-31 DIAGNOSIS — R0600 Dyspnea, unspecified: Secondary | ICD-10-CM | POA: Diagnosis not present

## 2019-08-31 DIAGNOSIS — R0609 Other forms of dyspnea: Secondary | ICD-10-CM

## 2019-08-31 LAB — ECHOCARDIOGRAM COMPLETE
Area-P 1/2: 5.2 cm2
S' Lateral: 3.6 cm

## 2019-09-15 ENCOUNTER — Encounter: Payer: Self-pay | Admitting: Internal Medicine

## 2019-09-15 ENCOUNTER — Other Ambulatory Visit: Payer: Self-pay

## 2019-09-15 ENCOUNTER — Ambulatory Visit: Payer: Medicare HMO | Admitting: Internal Medicine

## 2019-09-15 VITALS — BP 132/80 | HR 83 | Temp 97.1°F | Ht 65.0 in | Wt 233.0 lb

## 2019-09-15 DIAGNOSIS — Z01812 Encounter for preprocedural laboratory examination: Secondary | ICD-10-CM | POA: Diagnosis not present

## 2019-09-15 DIAGNOSIS — R9389 Abnormal findings on diagnostic imaging of other specified body structures: Secondary | ICD-10-CM

## 2019-09-15 DIAGNOSIS — R0609 Other forms of dyspnea: Secondary | ICD-10-CM

## 2019-09-15 DIAGNOSIS — R931 Abnormal findings on diagnostic imaging of heart and coronary circulation: Secondary | ICD-10-CM | POA: Diagnosis not present

## 2019-09-15 DIAGNOSIS — R0600 Dyspnea, unspecified: Secondary | ICD-10-CM

## 2019-09-15 DIAGNOSIS — R072 Precordial pain: Secondary | ICD-10-CM

## 2019-09-15 MED ORDER — METOPROLOL TARTRATE 100 MG PO TABS: ORAL_TABLET | ORAL | 0 refills | Status: AC

## 2019-09-15 NOTE — Patient Instructions (Addendum)
Medication Instructions:  Your Physician recommend you continue on your current medication as directed.    *If you need a refill on your cardiac medications before your next appointment, please call your pharmacy*   Lab Work: Your physician recommends that you return for lab work 1 week prior to test [facility])  If you have labs (blood work) drawn today and your tests are completely normal, you will receive your results only by: Marland Kitchen MyChart Message (if you have MyChart) OR . A paper copy in the mail If you have any lab test that is abnormal or we need to change your treatment, we will call you to review the results.   Testing/Procedures: Cardiac CT Angiography (CTA), is a special type of CT scan that uses a computer to produce multi-dimensional views of major blood vessels throughout the body. In CT angiography, a contrast material is injected through an IV to help visualize the blood vessels [facility]   Follow-Up: At [facility], you and your health needs are our priority.  As part of our continuing mission to provide you with exceptional heart care, we have created designated Provider Care Teams.  These Care Teams include your primary Cardiologist (physician) and Advanced Practice Providers (APPs -  Physician Assistants and Nurse Practitioners) who all work together to provide you with the care you need, when you need it.  We recommend signing up for the patient portal called "MyChart".  Sign up information is provided on this After Visit Summary.  MyChart is used to connect with patients for Virtual Visits (Telemedicine).  Patients are able to view lab/test results, encounter notes, upcoming appointments, etc.  Non-urgent messages can be sent to your provider as well.   To learn more about what you can do with MyChart, go to [email or web address].    Your next appointment:   6 week(s)  The format for your next appointment:   In Person  Provider:   Cherlynn Kaiser,  MD  Your cardiac CT will be scheduled at one of the below locations:   [facility] [address] [place], [place] [address] [phone]  If scheduled at [facility], please arrive at the [facility] main entrance of [facility] 30 minutes prior to test start time. Proceed to the [facility] Radiology Department (first floor) to check-in and test prep.  If scheduled at [facility], please arrive 15 mins early for check-in and test prep.  Please follow these instructions carefully (unless otherwise directed):  On the Night Before the Test: . Be sure to Drink plenty of water. . Do not consume any caffeinated/decaffeinated beverages or chocolate 12 hours prior to your test. . Do not take any antihistamines 12 hours prior to your test.   On the Day of the Test: . Drink plenty of water. Do not drink any water within one hour of the test. . Do not eat any food 4 hours prior to the test. . You may take your regular medications prior to the test.  . Take metoprolol (Lopressor) 100 mg two hours prior to test. . HOLD Furosemide/Hydrochlorothiazide morning of the test. . FEMALES- please wear underwire-free bra if available        After the Test: . Drink plenty of water. . After receiving IV contrast, you may experience a mild flushed feeling. This is normal. . On occasion, you may experience a mild rash up to 24 hours after the test. This is not dangerous. If this  occurs, you can take Benadryl 25 mg and increase your fluid intake. . If you experience trouble breathing, this can be serious. If it is severe call 911 IMMEDIATELY. If it is mild, please call our office. . If you take any of these medications: Glipizide/Metformin, Avandament, Glucavance, please do not take 48 hours after completing test unless otherwise instructed.   Once we have confirmed authorization from your insurance company, we will call you to set up a date and  time for your test. Based on how quickly your insurance processes prior authorizations requests, please allow up to 4 weeks to be contacted for scheduling your Cardiac CT appointment. Be advised that routine Cardiac CT appointments could be scheduled as many as 8 weeks after your provider has ordered it.  For non-scheduling related questions, please contact the cardiac imaging nurse navigator should you have any questions/concerns: Marchia Bond, Cardiac Imaging Nurse Navigator Burley Saver, Interim Cardiac Imaging Nurse [place] [facility] Direct Office Dial: [phone]   For scheduling needs, including cancellations and rescheduling, please call Vivien Rota at [phone], option 3.

## 2019-09-15 NOTE — Progress Notes (Deleted)
Cardiology Office Note:    Date:  09/15/2019   ID:  Destiny Hurley, Destiny Hurley 10/17/1950, MRN [id number]  PCP:  Karl Ito, DO  Cardiologist:  Parke Poisson, MD  Electrophysiologist:  None   Referring MD: Karl Ito, DO   Chief Complaint: ***  History of Present Illness:    Destiny Hurley is a 69 y.o. female with a history of ***  Past Medical History:  Diagnosis Date  . Adrenal mass (HCC) 2012   [place] pen on CT  . Chronic painful diabetic neuropathy (HCC)   . Depression    situational  . Diabetes mellitus   . Dysplasia of tongue   . Esophageal mass   . Heart murmur    Echo 2/09 revealing aortic sclerosis without gradient  . Hyperlipemia   . Hypertension   . Left shoulder pain   . Pancreatitis 07/30/2017   Lipase critically elevated, greater than 3x normal    Past Surgical History:  Procedure Laterality Date  . CHOLECYSTECTOMY    . COLONOSCOPY    . COLONOSCOPY WITH PROPOFOL N/A 05/05/2017   Procedure: COLONOSCOPY WITH PROPOFOL;  Surgeon: Toledo, Boykin Nearing, MD;  Location: [facility] ENDOSCOPY;  Service: Gastroenterology;  Laterality: N/A;  . EUS N/A 12/22/2017   Procedure: FULL UPPER ENDOSCOPIC ULTRASOUND (EUS) RADIAL;  Surgeon: Willis Modena, MD;  Location: [facility];  Service: Endoscopy;  Laterality: N/A;  . schatzki's ring     s/p dilation 2/04  . TUBAL LIGATION      Current Medications: Current Meds  Medication Sig  . ACCU-CHEK AVIVA PLUS test strip TEST BLOOD SUGAR TWICE DAILY  . acetaminophen (TYLENOL) 500 MG tablet Take 1,000 mg by mouth every 6 (six) hours as needed for moderate pain or headache.  Marland Kitchen amLODipine (NORVASC) 10 MG tablet TAKE 1 TABLET EVERY DAY  . Cholecalciferol (VITAMIN D3) 125 MCG (5000 UT) CAPS Take 1 capsule (5,000 Units total) by mouth daily.  . diclofenac sodium (VOLTAREN) 1 % GEL Apply 2 g topically 4 (four) times daily.  . furosemide (LASIX) 20 MG tablet 1 tablet daily.  Marland Kitchen gabapentin (NEURONTIN) 600 MG tablet TAKE 1 TABLET  TWICE DAILY  . glipiZIDE (GLUCOTROL XL) 5 MG 24 hr tablet Take 1 tablet (5 mg total) by mouth daily with breakfast.  . insulin glargine (LANTUS SOLOSTAR) 100 UNIT/ML Solostar Pen Inject 50 Units into the skin at bedtime.  Marland Kitchen lisinopril (ZESTRIL) 20 MG tablet TAKE 1 TABLET EVERY DAY  . lovastatin (MEVACOR) 20 MG tablet TAKE 1 TABLET EVERY DAY  . metFORMIN (GLUCOPHAGE) 1000 MG tablet TAKE 1 TABLET TWICE DAILY  . pantoprazole (PROTONIX) 40 MG tablet TAKE 1 TABLET TWICE DAILY  . Probiotic Product (ALIGN PO) Take by mouth.  . vitamin B-12 (CYANOCOBALAMIN) 500 MCG tablet Take 500 mcg by mouth daily.  . vitamin C (ASCORBIC ACID) 500 MG tablet Take 500 mg by mouth daily.     Allergies:   Insulins, Duloxetine, Sitagliptin, Dulaglutide, Hydrocodone, and Lyrica [pregabalin]   Social History   Socioeconomic History  . Marital status: Divorced    Spouse name: Not on file  . Number of children: Not on file  . Years of education: Not on file  . Highest education level: Not on file  Occupational History  . Not on file  Tobacco Use  . Smoking status: Never Smoker  . Smokeless tobacco: Current User    Types: Chew  . Tobacco comment: Chews 1 pack per day  Vaping Use  . Vaping Use:  Never used  Substance and Sexual Activity  . Alcohol use: Never  . Drug use: No  . Sexual activity: Not Currently  Other Topics Concern  . Not on file  Social History Narrative  . Not on file   Social Determinants of Health   Financial Resource Strain:   . Difficulty of Paying Living Expenses: Not on file  Food Insecurity:   . Worried About Programme researcher, broadcasting/film/video in the Last Year: Not on file  . Ran Out of Food in the Last Year: Not on file  Transportation Needs:   . Lack of Transportation (Medical): Not on file  . Lack of Transportation (Non-Medical): Not on file  Physical Activity:   . Days of Exercise per Week: Not on file  . Minutes of Exercise per Session: Not on file  Stress:   . Feeling of Stress : Not  on file  Social Connections:   . Frequency of Communication with Friends and Family: Not on file  . Frequency of Social Gatherings with Friends and Family: Not on file  . Attends Religious Services: Not on file  . Active Member of Clubs or Organizations: Not on file  . Attends Banker Meetings: Not on file  . Marital Status: Not on file     Family History: The patient's family history includes Arthritis in an other family member; Cancer in an other family member; Diabetes in an other family member; Heart attack (age of onset: 31) in her sister; Heart disease in an other family member; Kidney disease in an other family member; Lung disease in an other family member.  ROS:   Please see the history of present illness.    All other systems reviewed and are negative.  EKGs/Labs/Other Studies Reviewed:    The following studies were reviewed today:  EKG:  ***  I have independently reviewed the images from ***.  Recent Labs: 11/07/2018: ALT 11; BUN 13; Creat 0.81; Potassium 3.6; Sodium 141  Recent Lipid Panel    Component Value Date/Time   CHOL 148 11/19/2017 1009   TRIG 130 11/19/2017 1009   HDL 66 11/19/2017 1009   CHOLHDL 2.2 11/19/2017 1009   LDLCALC 61 11/19/2017 1009    Physical Exam:    VS:  BP 132/80   Pulse 83   Temp (!) 97.1 F (36.2 C)   Ht 5\' 5"  (1.651 m)   Wt 233 lb (105.7 kg)   SpO2 96%   BMI 38.77 kg/m     Wt Readings from Last 5 Encounters:  09/15/19 233 lb (105.7 kg)  08/15/19 (!) 230 lb (104.3 kg)  05/25/19 220 lb 12.8 oz (100.2 kg)  11/21/18 208 lb 3.2 oz (94.4 kg)  10/24/18 205 lb (93 kg)     Constitutional: No acute distress Eyes: sclera non-icteric, normal conjunctiva and lids ENMT: normal dentition, moist mucous membranes Cardiovascular: regular rhythm, normal rate, no murmurs. S1 and S2 normal. Radial pulses normal bilaterally. No jugular venous distention.  Respiratory: clear to auscultation bilaterally GI : normal bowel  sounds, soft and nontender. No distention.   MSK: extremities warm, well perfused. No edema.  NEURO: grossly nonfocal exam, moves all extremities. PSYCH: alert and oriented x 3, normal mood and affect.   ASSESSMENT:    No diagnosis found. PLAN:    No diagnosis found.  Total time of encounter: *** minutes total time of encounter, including *** minutes spent in face-to-face patient care on the date of this encounter. This time includes coordination  of care and counseling regarding above mentioned problem list. Remainder of non-face-to-face time involved reviewing chart documents/testing relevant to the patient encounter and documentation in the medical record. I have independently reviewed documentation from referring provider.   Weston Brass, MD [place]  [facility]    Medication Adjustments/Labs and Tests Ordered: Current medicines are reviewed at length with the patient today.  Concerns regarding medicines are outlined above.  No orders of the defined types were placed in this encounter.  No orders of the defined types were placed in this encounter.   There are no Patient Instructions on file for this visit.

## 2019-09-16 LAB — BASIC METABOLIC PANEL
BUN/Creatinine Ratio: 23 (ref 12–28)
BUN: 20 mg/dL (ref 8–27)
CO2: 22 mmol/L (ref 20–29)
Calcium: 10.4 mg/dL — ABNORMAL HIGH (ref 8.7–10.3)
Chloride: 103 mmol/L (ref 96–106)
Creatinine, Ser: 0.87 mg/dL (ref 0.57–1.00)
GFR calc Af Amer: 80 mL/min/{1.73_m2} (ref >59)
GFR calc non Af Amer: 69 mL/min/{1.73_m2} (ref >59)
Glucose: 132 mg/dL — ABNORMAL HIGH (ref 65–99)
Potassium: 5.2 mmol/L (ref 3.5–5.2)
Sodium: 142 mmol/L (ref 134–144)

## 2019-09-16 NOTE — Progress Notes (Signed)
Cardiology Office Note:    Date:  09/15/2019   ID:  Monice, Lundy 23-Feb-1950, MRN [id number]  PCP:  Karl Ito, DO  Cardiologist:  Parke Poisson, MD  Electrophysiologist:  None   Referring MD: Karl Ito, DO   Chief Complaint: DOE  History of Present Illness:    Destiny Hurley is a 69 y.o. female with a history of DM2, HTN, HLD. Following up for testing in setting of DOE.    Echo showed abnormal wall motion, overall preserved EF. DOE has not significantly changed Discussed next steps in detail, determined CCTA next best step.   The patient denies chest pain, chest pressure, palpitations, PND, orthopnea, or leg swelling. Denies cough, fever, chills. Denies nausea, vomiting. Denies syncope or presyncope. Denies dizziness or lightheadedness.  Past Medical History:  Diagnosis Date   Adrenal mass (HCC) 2012   Tae pen on CT   Chronic painful diabetic neuropathy (HCC)    Depression    situational   Diabetes mellitus    Dysplasia of tongue    Esophageal mass    Heart murmur    Echo 2/09 revealing aortic sclerosis without gradient   Hyperlipemia    Hypertension    Left shoulder pain    Pancreatitis 07/30/2017   Lipase critically elevated, greater than 3x normal    Past Surgical History:  Procedure Laterality Date   CHOLECYSTECTOMY     COLONOSCOPY     COLONOSCOPY WITH PROPOFOL N/A 05/05/2017   Procedure: COLONOSCOPY WITH PROPOFOL;  Surgeon: Toledo, Boykin Nearing, MD;  Location: [facility] ENDOSCOPY;  Service: Gastroenterology;  Laterality: N/A;   EUS N/A 12/22/2017   Procedure: FULL UPPER ENDOSCOPIC ULTRASOUND (EUS) RADIAL;  Surgeon: Willis Modena, MD;  Location: [facility]COPY;  Service: Endoscopy;  Laterality: N/A;   schatzki's ring     s/p dilation 2/04   TUBAL LIGATION      Current Medications: Current Meds  Medication Sig   ACCU-CHEK AVIVA PLUS test strip TEST BLOOD SUGAR TWICE DAILY   acetaminophen (TYLENOL) 500 MG tablet Take  1,000 mg by mouth every 6 (six) hours as needed for moderate pain or headache.   amLODipine (NORVASC) 10 MG tablet TAKE 1 TABLET EVERY DAY   Cholecalciferol (VITAMIN D3) 125 MCG (5000 UT) CAPS Take 1 capsule (5,000 Units total) by mouth daily.   diclofenac sodium (VOLTAREN) 1 % GEL Apply 2 g topically 4 (four) times daily.   furosemide (LASIX) 20 MG tablet 1 tablet daily.   gabapentin (NEURONTIN) 600 MG tablet TAKE 1 TABLET TWICE DAILY   glipiZIDE (GLUCOTROL XL) 5 MG 24 hr tablet Take 1 tablet (5 mg total) by mouth daily with breakfast.   insulin glargine (LANTUS SOLOSTAR) 100 UNIT/ML Solostar Pen Inject 50 Units into the skin at bedtime.   lisinopril (ZESTRIL) 20 MG tablet TAKE 1 TABLET EVERY DAY   lovastatin (MEVACOR) 20 MG tablet TAKE 1 TABLET EVERY DAY   metFORMIN (GLUCOPHAGE) 1000 MG tablet TAKE 1 TABLET TWICE DAILY   pantoprazole (PROTONIX) 40 MG tablet TAKE 1 TABLET TWICE DAILY   Probiotic Product (ALIGN PO) Take by mouth.   vitamin B-12 (CYANOCOBALAMIN) 500 MCG tablet Take 500 mcg by mouth daily.   vitamin C (ASCORBIC ACID) 500 MG tablet Take 500 mg by mouth daily.     Allergies:   Insulins, Duloxetine, Sitagliptin, Dulaglutide, Hydrocodone, and Lyrica [pregabalin]   Social History   Socioeconomic History   Marital status: Divorced    Spouse name: Not on file  Number of children: Not on file   Years of education: Not on file   Highest education level: Not on file  Occupational History   Not on file  Tobacco Use   Smoking status: Never Smoker   Smokeless tobacco: Current User    Types: Chew   Tobacco comment: Chews 1 pack per day  Vaping Use   Vaping Use: Never used  Substance and Sexual Activity   Alcohol use: Never   Drug use: No   Sexual activity: Not Currently  Other Topics Concern   Not on file  Social History Narrative   Not on file   Social Determinants of Health   Financial Resource Strain:    Difficulty of Paying Living  Expenses: Not on file  Food Insecurity:    Worried About Running Out of Food in the Last Year: Not on file   Ran Out of Food in the Last Year: Not on file  Transportation Needs:    Lack of Transportation (Medical): Not on file   Lack of Transportation (Non-Medical): Not on file  Physical Activity:    Days of Exercise per Week: Not on file   Minutes of Exercise per Session: Not on file  Stress:    Feeling of Stress : Not on file  Social Connections:    Frequency of Communication with Friends and Family: Not on file   Frequency of Social Gatherings with Friends and Family: Not on file   Attends Religious Services: Not on file   Active Member of Clubs or Organizations: Not on file   Attends Banker Meetings: Not on file   Marital Status: Not on file     Family History: The patient's family history includes Arthritis in an other family member; Cancer in an other family member; Diabetes in an other family member; Heart attack (age of onset: 21) in her sister; Heart disease in an other family member; Kidney disease in an other family member; Lung disease in an other family member.  ROS:   Please see the history of present illness.    All other systems reviewed and are negative.  EKGs/Labs/Other Studies Reviewed:    The following studies were reviewed today:  Echocardiogram.  Recent Labs: 11/07/2018: ALT 11; BUN 13; Creat 0.81; Potassium 3.6; Sodium 141  Recent Lipid Panel    Component Value Date/Time   CHOL 148 11/19/2017 1009   TRIG 130 11/19/2017 1009   HDL 66 11/19/2017 1009   CHOLHDL 2.2 11/19/2017 1009   LDLCALC 61 11/19/2017 1009    Physical Exam:    VS:  BP 132/80    Pulse 83    Temp (!) 97.1 F (36.2 C)    Ht 5\' 5"  (1.651 m)    Wt 233 lb (105.7 kg)    SpO2 96%    BMI 38.77 kg/m     Wt Readings from Last 5 Encounters:  09/15/19 233 lb (105.7 kg)  08/15/19 (!) 230 lb (104.3 kg)  05/25/19 220 lb 12.8 oz (100.2 kg)  11/21/18 208 lb 3.2  oz (94.4 kg)  10/24/18 205 lb (93 kg)     Constitutional: No acute distress Eyes: sclera non-icteric, normal conjunctiva and lids ENMT: normal dentition, moist mucous membranes Cardiovascular: regular rhythm, normal rate, no murmurs. S1 and S2 normal. Radial pulses normal bilaterally. No jugular venous distention.  Respiratory: clear to auscultation bilaterally GI : normal bowel sounds, soft and nontender. No distention.   MSK: extremities warm, well perfused. No edema.  NEURO:  grossly nonfocal exam, moves all extremities. PSYCH: alert and oriented x 3, normal mood and affect.   ASSESSMENT:    1. DOE (dyspnea on exertion)   2. Precordial pain   3. Pre-procedure lab exam   4. Abnormal echocardiogram   5. Abnormal ventricular wall motion    PLAN:    DOE and abnormal echo - will obtain CCTA to evaluate for CAD in setting of wall motion abnormalities and DOE.   HTN - BP better controlled today. Continue current therapy.  Total time of encounter: 20 minutes total time of encounter, including 15 minutes spent in face-to-face patient care on the date of this encounter. This time includes coordination of care and counseling regarding above mentioned problem list. Remainder of non-face-to-face time involved reviewing chart documents/testing relevant to the patient encounter and documentation in the medical record. I have independently reviewed documentation from referring provider.   Weston Brass, MD Person   [facility]    Medication Adjustments/Labs and Tests Ordered: Current medicines are reviewed at length with the patient today.  Concerns regarding medicines are outlined above.  No orders of the defined types were placed in this encounter.  No orders of the defined types were placed in this encounter.   There are no Patient Instructions on file for this visit.

## 2019-09-28 ENCOUNTER — Ambulatory Visit: Payer: Medicare HMO | Admitting: Nurse Practitioner

## 2019-11-02 ENCOUNTER — Ambulatory Visit: Payer: Medicare HMO | Admitting: Internal Medicine

## 2020-02-21 ENCOUNTER — Other Ambulatory Visit: Payer: Self-pay | Admitting: General Practice

## 2020-02-21 ENCOUNTER — Ambulatory Visit
Admission: RE | Admit: 2020-02-21 | Discharge: 2020-02-21 | Disposition: A | Discharge status: Home / Self Care | Payer: Medicare HMO | Source: Ambulatory Visit | Attending: General Practice | Admitting: General Practice

## 2020-02-21 DIAGNOSIS — R109 Unspecified abdominal pain: Secondary | ICD-10-CM

## 2020-03-18 ENCOUNTER — Other Ambulatory Visit: Payer: Self-pay

## 2020-03-18 ENCOUNTER — Encounter: Payer: Self-pay | Admitting: Podiatry

## 2020-03-18 ENCOUNTER — Ambulatory Visit (INDEPENDENT_AMBULATORY_CARE_PROVIDER_SITE_OTHER): Payer: Medicare HMO

## 2020-03-18 ENCOUNTER — Ambulatory Visit: Payer: Medicare HMO | Admitting: Podiatry

## 2020-03-18 DIAGNOSIS — M779 Enthesopathy, unspecified: Secondary | ICD-10-CM

## 2020-03-18 DIAGNOSIS — M79672 Pain in left foot: Secondary | ICD-10-CM | POA: Diagnosis not present

## 2020-03-18 DIAGNOSIS — Z794 Long term (current) use of insulin: Secondary | ICD-10-CM

## 2020-03-18 DIAGNOSIS — M79671 Pain in right foot: Secondary | ICD-10-CM | POA: Diagnosis not present

## 2020-03-18 DIAGNOSIS — E1140 Type 2 diabetes mellitus with diabetic neuropathy, unspecified: Secondary | ICD-10-CM | POA: Diagnosis not present

## 2020-03-18 MED ORDER — TRIAMCINOLONE ACETONIDE 10 MG/ML IJ SUSP
10.0000 mg | Freq: Once | INTRAMUSCULAR | Status: AC
  Administered 2020-03-18: 10 mg

## 2020-03-18 MED ORDER — TRIAMCINOLONE ACETONIDE 10 MG/ML IJ SUSP: 10.0000 mg | Freq: Once | INTRAMUSCULAR | Status: AC

## 2020-03-18 NOTE — Progress Notes (Signed)
Subjective:   Patient ID: Destiny Hurley, female   DOB: 70 y.o.   MRN: [id number]   HPI Patient presents stating she has had long-term pain with her feet that is gotten worse over the last few months and is also concerned about neuropathy stating that she has had numbness in her feet that could become bothersome and she does take gabapentin which is only helped her so much and she is not taking good care of her sugar currently   ROS      Objective:  Physical Exam  Neurovascular status found to be diminished with diminished PT pulses bilateral with patient noted to have exquisite discomfort around the first metatarsal head plantar bilateral around the sesamoidal complex both fibular and tibial and does have significant signs of neuropathic changes      Assessment:  Neuropathic changes that are secondary to her long-term diabetes most likely with sugar which is not in his good control as it should be along with inflammatory sesamoiditis capsulitis bilateral      Plan:  H&P reviewed condition x-rays reviewed.  Today I did sterile prep injected the first metatarsal head plantar bilateral 3 mg Dexasone Kenalog 5 mg Xylocaine and advised on Pap therapy.  I then went ahead and discussed neuropathy we discussed treatment options and especially the importance of better control of her sugar and consideration for increasing her gabapentin beyond the 1200 mg she is taking reviewed these options with her and also discussed physical therapy  X-rays were negative for signs of bony sesamoidal injury or other pathology from that standpoint

## 2020-05-31 ENCOUNTER — Ambulatory Visit
Admission: RE | Admit: 2020-05-31 | Discharge: 2020-05-31 | Disposition: A | Discharge status: Home / Self Care | Payer: Medicare HMO | Source: Ambulatory Visit | Attending: Family Medicine | Admitting: Family Medicine

## 2020-05-31 ENCOUNTER — Other Ambulatory Visit: Payer: Self-pay | Admitting: Family Medicine

## 2020-05-31 DIAGNOSIS — M25511 Pain in right shoulder: Secondary | ICD-10-CM

## 2020-08-16 ENCOUNTER — Encounter: Payer: Medicare HMO | Attending: General Practice | Admitting: Dietician

## 2020-08-16 ENCOUNTER — Encounter: Payer: Self-pay | Admitting: Dietician

## 2020-08-16 ENCOUNTER — Other Ambulatory Visit: Payer: Self-pay

## 2020-08-16 DIAGNOSIS — Z794 Long term (current) use of insulin: Secondary | ICD-10-CM | POA: Insufficient documentation

## 2020-08-16 DIAGNOSIS — E1140 Type 2 diabetes mellitus with diabetic neuropathy, unspecified: Secondary | ICD-10-CM | POA: Insufficient documentation
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
## 2020-08-16 NOTE — Patient Instructions (Addendum)
[facility] [place]:  [place]: [phone]  Great job on changing your beverages to those without sugar! Stay active most days. Half your plate should be vegetables. Small portions of meat or choose beans instead.  Start decreasing your salt intake. Bake rather than fry most often. Remove the skin from the chicken. Keep working on quitting chewing tobacco

## 2020-09-08 IMAGING — CT CT CHEST W/O CM
2 of 4 series · 14 of 36 positions shown, 17 images · non-contrast
Comparison: Chest radiograph 07/30/2017.  Abdominal CT 07/31/2017.

CLINICAL DATA: Bilateral chest wall pain for 1 year. Worse after
eating.

EXAM:
CT CHEST WITHOUT CONTRAST
TECHNIQUE: Multidetector CT imaging of the chest was performed following the
standard protocol without IV contrast.

[Series 2: chest 2.00 br40 s3 ax · axial · 0.61mm/px · z∈[+1480,+1742]mm · 11 of 153 slices shown, 14 images]
[im 11/153  mediastinal]
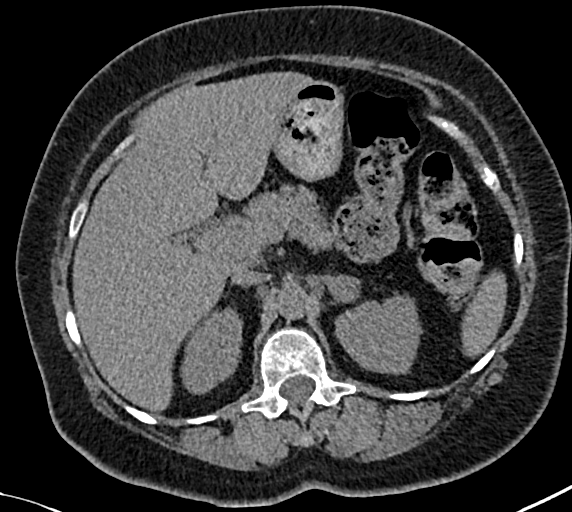
[im 11/153  lung]
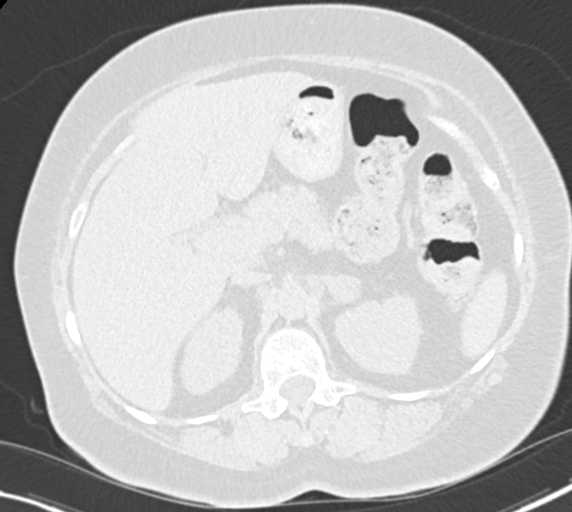
[im 22/153  lung]
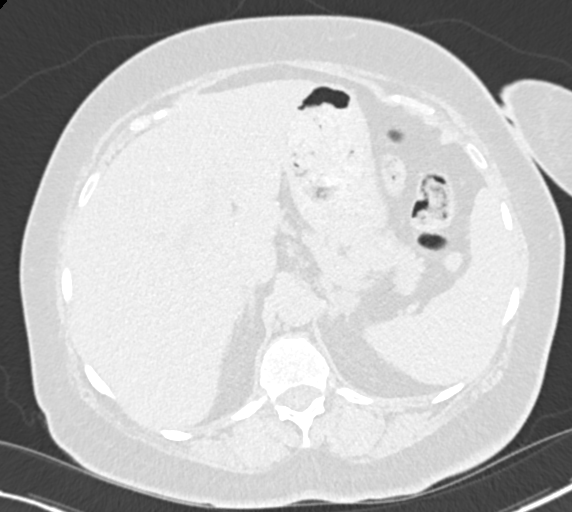
[im 33/153  lung]
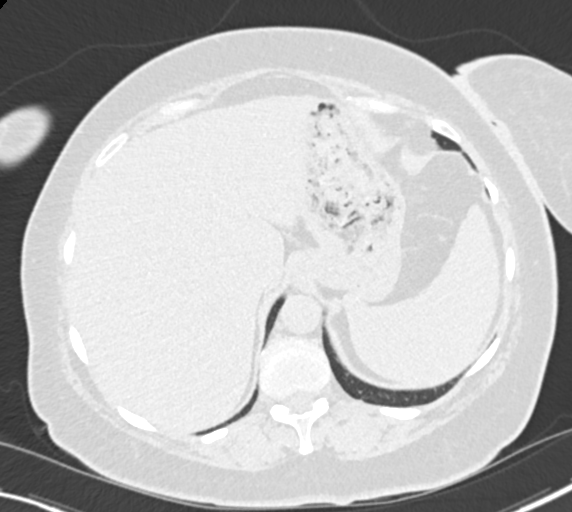
[im 55/153  lung]
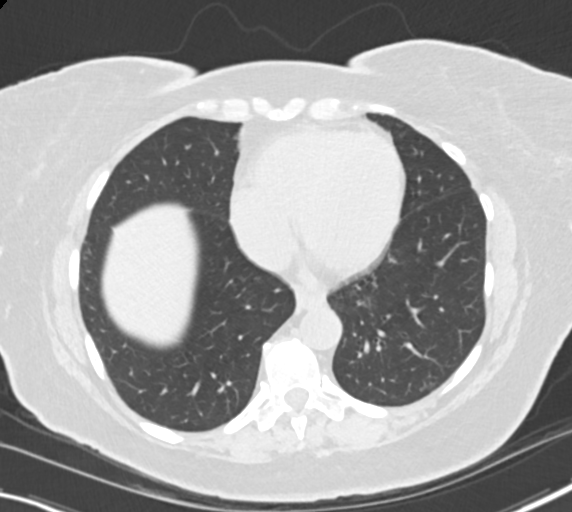
[im 66/153  mediastinal]
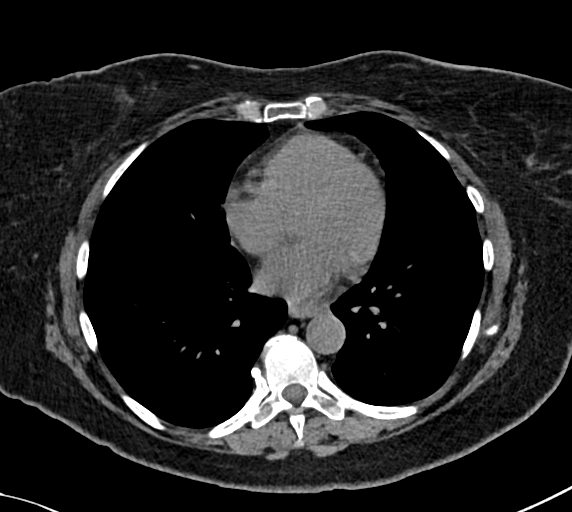
[im 66/153  lung]
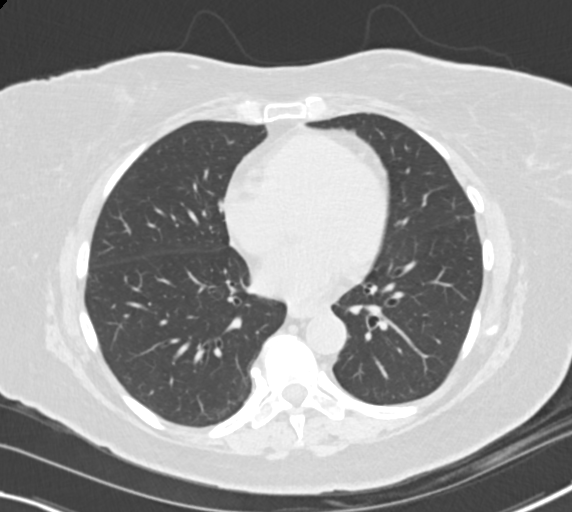
[im 77/153  lung]
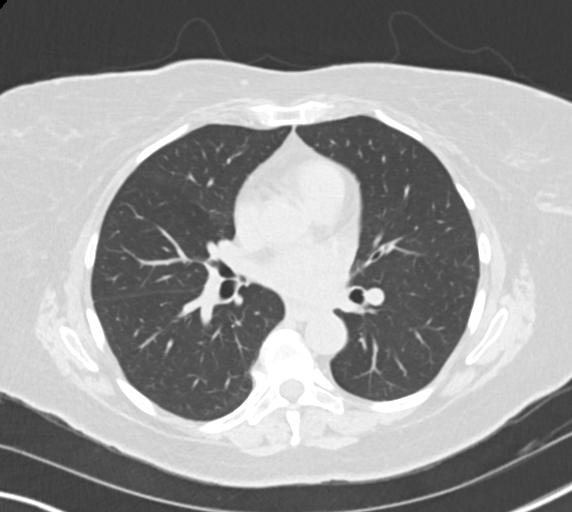
[im 87/153  lung]
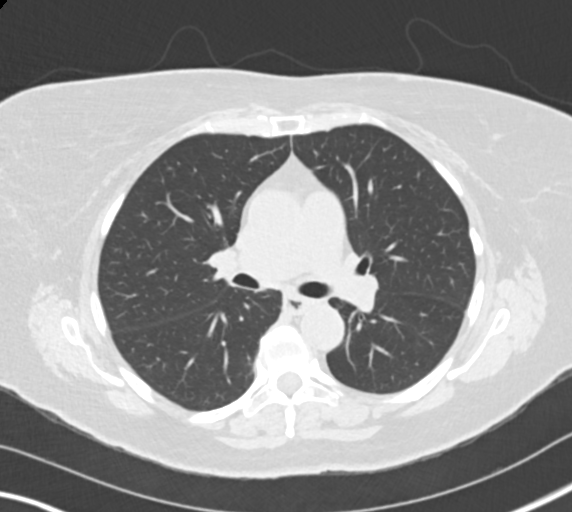
[im 98/153  lung]
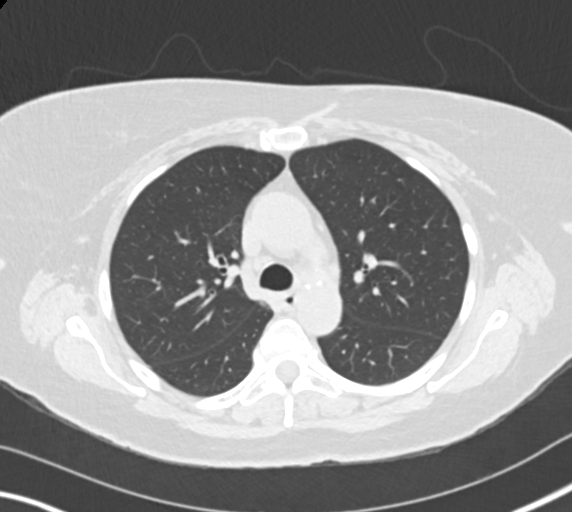
[im 120/153  mediastinal]
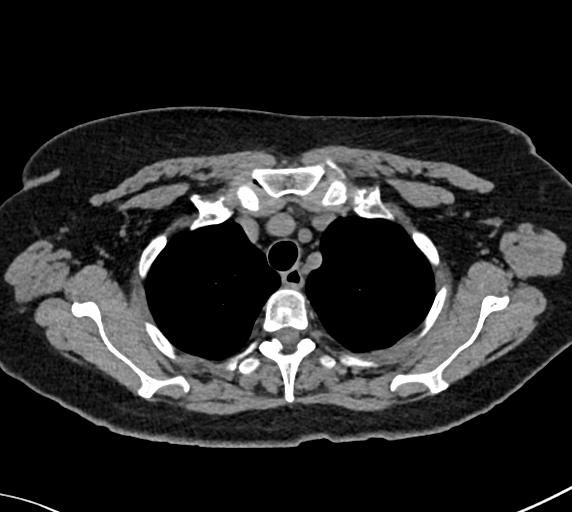
[im 120/153  lung]
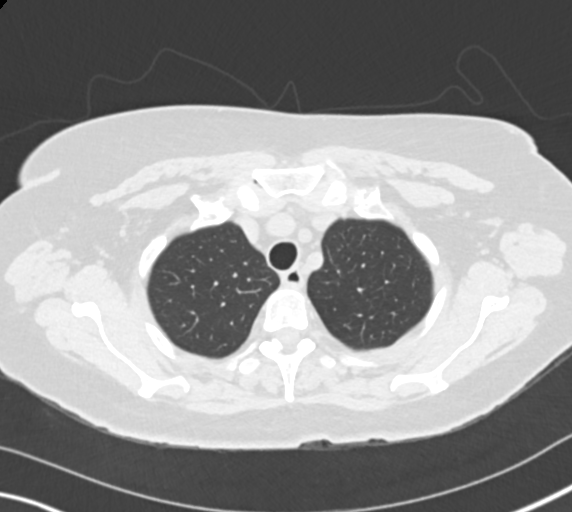
[im 131/153  lung]
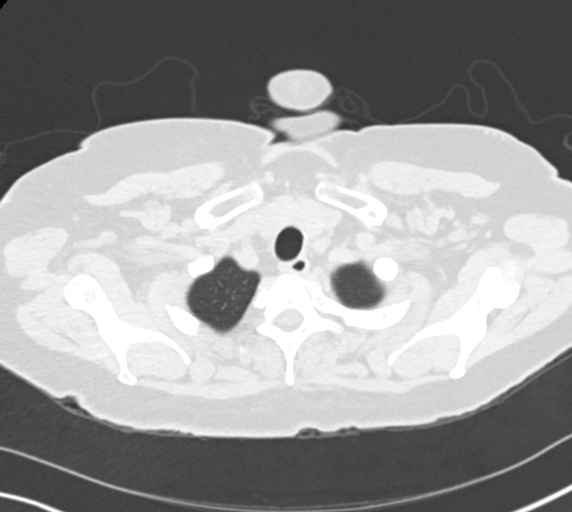
[im 142/153  lung]
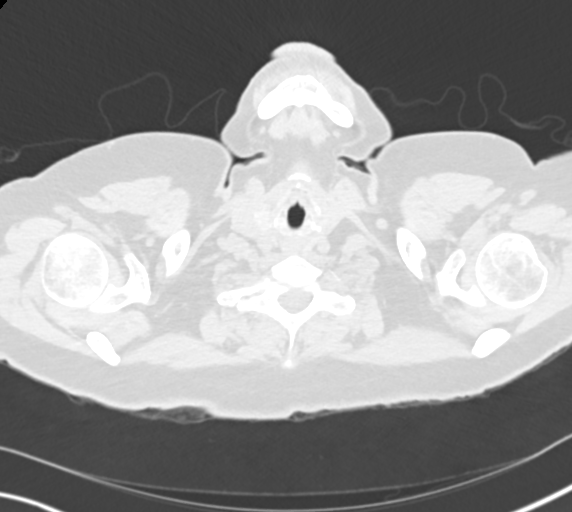

[Series 4: chest 2.00 br40 s3 cor · coronal · 0.60mm/px · 3 of 156 slices shown]
[im 32/156  lung]
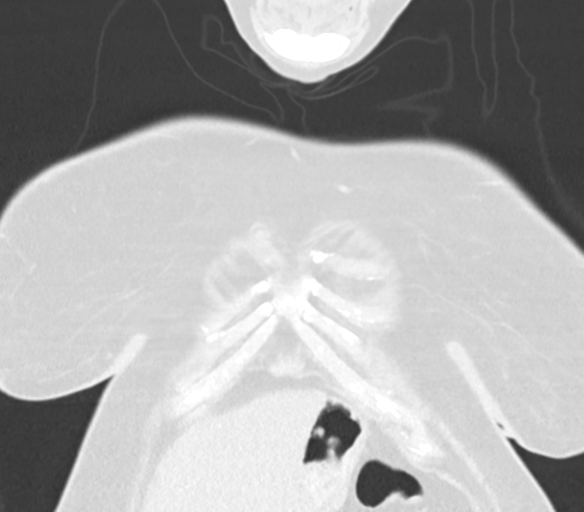
[im 63/156  lung]
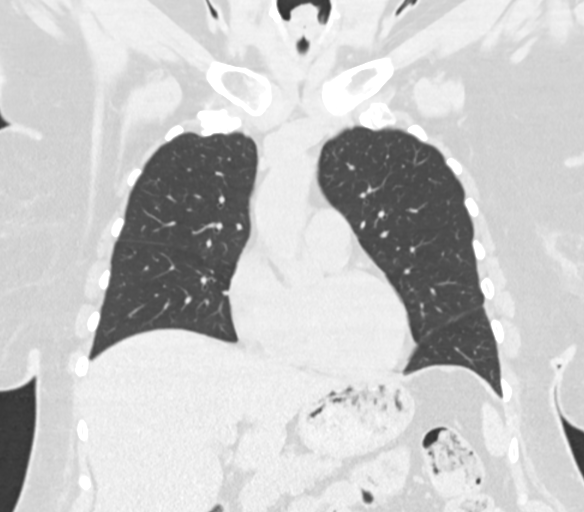
[im 94/156  lung]
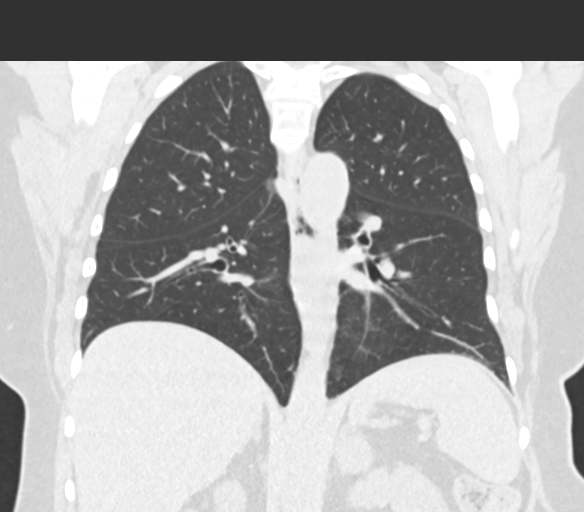

[14 of 36 positions shown; findings below may reference images not displayed]

FINDINGS: Cardiovascular: Aortic atherosclerosis. Tortuous thoracic aorta.
Normal heart size, without pericardial effusion.

Mediastinum/Nodes: No supraclavicular adenopathy. No mediastinal or
definite hilar adenopathy, given limitations of unenhanced CT.

Lungs/Pleura: No pleural fluid.  Clear lungs.

Upper Abdomen: Mild hepatic steatosis. Normal imaged portions of the
spleen, stomach, pancreas, right adrenal gland, kidneys. Left
adrenal nodule has been evaluated previously, similar at 2.7 cm.

Musculoskeletal: No acute osseous abnormality. Mild thoracic
spondylosis.
IMPRESSION: 1.  No acute process in the chest.
2. Hepatic steatosis.
3. Left adrenal nodule, consistent with adenoma based on ongoing
size stability.
4.  Aortic Atherosclerosis ([id number]).

## 2020-09-26 ENCOUNTER — Ambulatory Visit: Payer: Medicare HMO | Admitting: Podiatry

## 2020-09-26 ENCOUNTER — Other Ambulatory Visit: Payer: Self-pay

## 2020-09-26 ENCOUNTER — Ambulatory Visit (INDEPENDENT_AMBULATORY_CARE_PROVIDER_SITE_OTHER): Payer: Medicare HMO | Admitting: Podiatry

## 2020-09-26 DIAGNOSIS — Z794 Long term (current) use of insulin: Secondary | ICD-10-CM

## 2020-09-26 DIAGNOSIS — M779 Enthesopathy, unspecified: Secondary | ICD-10-CM | POA: Diagnosis not present

## 2020-09-26 DIAGNOSIS — M7751 Other enthesopathy of right foot: Secondary | ICD-10-CM

## 2020-09-26 DIAGNOSIS — E1140 Type 2 diabetes mellitus with diabetic neuropathy, unspecified: Secondary | ICD-10-CM

## 2020-09-26 DIAGNOSIS — M7752 Other enthesopathy of left foot: Secondary | ICD-10-CM | POA: Diagnosis not present

## 2020-09-26 MED ORDER — DEXAMETHASONE SODIUM PHOSPHATE 120 MG/30ML IJ SOLN
4.0000 mg | Freq: Once | INTRAMUSCULAR | Status: AC
  Administered 2020-09-26: 4 mg via INTRA_ARTICULAR

## 2020-09-26 NOTE — Patient Instructions (Signed)

## 2020-09-30 ENCOUNTER — Other Ambulatory Visit: Payer: Self-pay | Admitting: Podiatry

## 2020-09-30 ENCOUNTER — Telehealth: Payer: Self-pay | Admitting: *Deleted

## 2020-09-30 MED ORDER — DICLOFENAC SODIUM 1 % EX GEL: 2.0000 g | Freq: Four times a day (QID) | CUTANEOUS | 2 refills | Status: AC

## 2020-09-30 NOTE — Telephone Encounter (Signed)
Patient is calling for the status of a cream for the feet that was supposed to be sent to pharmacy, went to [facility], not there, Please advise.

## 2020-10-01 NOTE — Progress Notes (Signed)
Subjective: 70 year old female presents the office today for evaluation of bilateral foot pain.  She was last seen by Dr. Charlsie Merles in February for the same issue and she had steroid injections and she states they were very helpful however the pain started come back and she is asking for Injections.  She also gets some pain in the ball of her feet.  Denies any recent injury or trauma.  She is also diabetic and has neuropathy and she is on 1200 mg of gabapentin daily.  She thinks that her numbness and neuropathy symptoms are getting worse.  Her last A1c was 8.2.  Objective: AAO x3, NAD DP/PT pulses palpable 1/4 bilaterally, CRT less than 3 seconds Tenderness to palpation along the distal portion of plantar fascia just proximal to the sesamoid complex bilaterally.  Mild tenderness medial band plantar fascial along the arch of the foot.  No significant discomfort of the heel.  There is no area of pinpoint tenderness.  Tenderness on sesamoid complex bilaterally as well.  No area of pinpoint tenderness.  MMT 5/5. No open lesions or pre-ulcerative lesions.  No pain with calf compression, swelling, warmth, erythema  Assessment: Capsulitis, type 2 diabetes with neuropathy  Plan: -All treatment options discussed with the patient including all alternatives, risks, complications.  -Repeat steroid injections were performed.  Skin was cleaned with alcohol and mixture of 0.5 cc of dexamethasone phosphate, 0.5 cc of Marcaine plain was infiltrated on the sesamoid complex bilaterally.  Postinjection care was discussed. -Dispensed metatarsal offloading pads -Discussed daily foot inspection.  Also will send a note to her primary care physician about possibly increasing gabapentin. -Discussed using Voltaren gel as well. -Patient encouraged to call the office with any questions, concerns, change in symptoms.   Destiny Hurley DPM

## 2020-10-04 ENCOUNTER — Encounter: Payer: Self-pay | Admitting: Dietician

## 2020-10-04 ENCOUNTER — Other Ambulatory Visit: Payer: Self-pay

## 2020-10-04 ENCOUNTER — Encounter: Payer: Medicare HMO | Attending: General Practice | Admitting: Dietician

## 2020-10-04 DIAGNOSIS — E1140 Type 2 diabetes mellitus with diabetic neuropathy, unspecified: Secondary | ICD-10-CM | POA: Diagnosis present

## 2020-10-04 DIAGNOSIS — Z794 Long term (current) use of insulin: Secondary | ICD-10-CM | POA: Insufficient documentation
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
## 2020-10-04 NOTE — Patient Instructions (Addendum)
Rotate where you take your insulin. Avoid sweetened drinks and regular soda- read the label and get drinks with 0 grams carbohydrates.  Choose water  True Lemon can be mixed with water  Seltzer water such as AHA  Unsweetened tea  Avoid skipping meals Half your plate should be vegetables Add a protein such as chicken, fish, egg, cheese, or beans to your meal

## 2020-12-26 ENCOUNTER — Ambulatory Visit: Payer: Medicare HMO | Admitting: Podiatry

## 2020-12-26 ENCOUNTER — Other Ambulatory Visit: Payer: Self-pay

## 2020-12-26 DIAGNOSIS — M7752 Other enthesopathy of left foot: Secondary | ICD-10-CM | POA: Diagnosis not present

## 2020-12-26 DIAGNOSIS — E1140 Type 2 diabetes mellitus with diabetic neuropathy, unspecified: Secondary | ICD-10-CM

## 2020-12-26 DIAGNOSIS — E119 Type 2 diabetes mellitus without complications: Secondary | ICD-10-CM | POA: Diagnosis not present

## 2020-12-26 DIAGNOSIS — Z794 Long term (current) use of insulin: Secondary | ICD-10-CM | POA: Diagnosis not present

## 2020-12-29 NOTE — Progress Notes (Signed)
Subjective: 70 year old female presents the office today for follow-up evaluation of recurrent foot pain.  She states that the last injection was helpful but her doctor told her to stop the more steroid injections.  She is also not able to take oral anti-inflammatories.  She is been using topical Voltaren gel which seems to help.  No recent injury or falls.  She has no other concerns today.  Objective: AAO x3, NAD DP/PT pulses palpable bilaterally, CRT less than 3 seconds Sensation decreased sensation to monofilament. There is mild tenderness palpation of the submetatarsal area and along the sesamoid complex bilaterally.  There is modest Medial Band Plantar Fascia along the Arch of the Foot.  Is No Area of Pinpoint Tenderness Identified Today.  No Significant increase in edema.  Flexor, extensor tendons appear to be intact.   No open lesions. No pain with calf compression, swelling, warmth, erythema  Assessment: Capsulitis, metatarsalgia Planter fasciitis  Plan: -All treatment options discussed with the patient including all alternatives, risks, complications.  -She can use Voltaren gel which is helpful.  Discussed other topicals that she can use including Biofreeze or Aspercreme with lidocaine.  Continue metatarsal pads and shoes with good arch support. -Gabapentin dose has been increased by her primary care physician. -For diabetes standpoint discussed daily foot inspection, glucose control. -Patient encouraged to call the office with any questions, concerns, change in symptoms.

## 2021-03-27 ENCOUNTER — Ambulatory Visit: Payer: Medicare HMO | Admitting: Podiatry

## 2021-03-27 ENCOUNTER — Other Ambulatory Visit: Payer: Self-pay

## 2021-03-27 DIAGNOSIS — M79674 Pain in right toe(s): Secondary | ICD-10-CM

## 2021-03-27 DIAGNOSIS — Z794 Long term (current) use of insulin: Secondary | ICD-10-CM | POA: Diagnosis not present

## 2021-03-27 DIAGNOSIS — B351 Tinea unguium: Secondary | ICD-10-CM | POA: Diagnosis not present

## 2021-03-27 DIAGNOSIS — E1140 Type 2 diabetes mellitus with diabetic neuropathy, unspecified: Secondary | ICD-10-CM

## 2021-03-27 DIAGNOSIS — M79675 Pain in left toe(s): Secondary | ICD-10-CM

## 2021-03-30 NOTE — Progress Notes (Signed)
Subjective: ?71 year old female presents the office today for Evaluation of foot pain.  She states that she is doing well with her feet.  She states the nails are thickened elongated she has difficulty trimming them herself and causing discomfort.  Denies any swelling or redness or any drainage.  She has no other concerns today.  Last glucose was 201 and her last A1c was 8.4 that she reports. ? ?Objective: ?AAO x3, NAD ?DP/PT pulses palpable bilaterally, CRT less than 3 seconds ?Sensation decreased with Semmes Weinstein monofilament ?Nails are hypertrophic, dystrophic, brittle, discolored, elongated ?10. No surrounding redness or drainage. Tenderness nails 1-5 bilaterally. No open lesions or pre-ulcerative lesions are identified today. ?On the medial aspect the right heel is a annular hyperpigmented lesion, mole.  It does have a uniform borders and is symmetrical.  Has not changed in size. ?There is no area of pinpoint tenderness noted today.  No edema, erythema.  MMT 5/5. ?No pain with calf compression, swelling, warmth, erythema ? ?Assessment: ?71 year old female with symptomatic onychomycosis, type 2 diabetes with neuropathy ? ?Plan: ?-All treatment options discussed with the patient including all alternatives, risks, complications.  ?-Nails sharply debrided x10 without any complications or bleeding. ?-Continue gabapentin for neuropathy.  Discussed daily foot inspection, glucose control. ?-Continue with supportive shoe gear. ?-Patient encouraged to call the office with any questions, concerns, change in symptoms.  ? ?Vivi Barrack DPM ? ?

## 2021-05-30 ENCOUNTER — Other Ambulatory Visit: Payer: Self-pay | Admitting: Nephrology

## 2021-05-30 DIAGNOSIS — R809 Proteinuria, unspecified: Secondary | ICD-10-CM

## 2021-05-30 DIAGNOSIS — I509 Heart failure, unspecified: Secondary | ICD-10-CM

## 2021-05-30 DIAGNOSIS — N1831 Chronic kidney disease, stage 3a: Secondary | ICD-10-CM

## 2021-06-03 ENCOUNTER — Ambulatory Visit
Admission: RE | Admit: 2021-06-03 | Discharge: 2021-06-03 | Disposition: A | Discharge status: Home / Self Care | Payer: Medicare HMO | Source: Ambulatory Visit | Attending: Nephrology | Admitting: Nephrology

## 2021-06-03 DIAGNOSIS — I509 Heart failure, unspecified: Secondary | ICD-10-CM

## 2021-06-03 DIAGNOSIS — N1831 Chronic kidney disease, stage 3a: Secondary | ICD-10-CM

## 2021-06-03 DIAGNOSIS — R809 Proteinuria, unspecified: Secondary | ICD-10-CM

## 2021-10-02 ENCOUNTER — Ambulatory Visit: Payer: Medicare HMO | Admitting: Podiatry

## 2022-02-16 ENCOUNTER — Other Ambulatory Visit: Payer: Self-pay | Admitting: Registered Nurse

## 2022-02-16 DIAGNOSIS — Z1231 Encounter for screening mammogram for malignant neoplasm of breast: Secondary | ICD-10-CM

## 2022-04-24 ENCOUNTER — Ambulatory Visit
Admission: RE | Admit: 2022-04-24 | Discharge: 2022-04-24 | Disposition: A | Discharge status: Home / Self Care | Payer: Medicare HMO | Source: Ambulatory Visit | Attending: Registered Nurse | Admitting: Registered Nurse

## 2022-04-24 DIAGNOSIS — Z1231 Encounter for screening mammogram for malignant neoplasm of breast: Secondary | ICD-10-CM

## 2022-09-15 NOTE — ED Provider Notes (Signed)
 [facility] Emergency Department Provider Note    ED Clinical Impression    Final diagnoses:  Chronic pain of lower extremity, bilateral (Primary)        Impression, Medical Decision Making, Progress Notes and Critical Care    Impression, Differential Diagnosis and Plan of Care  This is an afebrile, nontoxic-appearing 72 year old female presenting for evaluation of 6 years of lower extremity pain located mostly in her bilateral feet.  Patient utilizes gabapentin  for pain management.  Was noted on her interview that she initially said she was taking gabapentin  1 pill in the morning and 1 at night.  In review of her medications it was noted that her prescription states take one 600 mg pill twice daily and one 300 mg capsule once daily at night.  On exam she has some mild edema to bilateral lower extremities which does not report is new.  Capillary refill of multiple toes is 4 to 5 seconds being slightly delayed.  Dorsalis pedis pulse intact however weak on exam and unable to palpate posterior tibial pulses.  No back pain elicited on exam and no midline spinal tenderness.  Differentials include diabetic neuropathy causing pain versus arterial claudication versus electrolyte abnormalities.  Stronger feel for diabetic neuropathy causing patient's pain that is being currently under managed with oral medications.  Patient is taking less of her gabapentin  than she is prescribed with total daily dose of of 900 mg when she is supposed to be taking 1200 mg.  There is still room for adjusting this medication as well and may be beneficial to help with her discomfort.  ED Course as of 09/15/22 1913  Tue Sep 15, 2022  1913 Patient's PVL ABI was nonconcerning.  Feel that her symptoms are likely due to her diabetic neuropathy.  Patient to increase her gabapentin  dosing to the prescribed amount and follow-up with her primary care.  Patient also advised to follow-up with her kidney doctor due to creatinine  GFR noted in ED there is undetermined if this is her baseline or signs of injury.     Portions of this record have been created using Scientist, clinical (histocompatibility and immunogenetics). Dictation errors have been sought, but may not have been identified and corrected.  See chart and resident provider documentation for details.  ____________________________________________      History     Reason for Visit Foot Swelling    HPI  Destiny Hurley is a 72 y.o. female past medical history as listed below with significance for diabetes that is poorly managed with A1c's at 9.4 and 9.8 on most recent draws.  She states she has been having this pain in her bilateral feet for 6 years which has increased over the past 2 years.  She states the gabapentin  she is taking is not helping and endorses taking 600 mg tablet in the morning and 300 mg capsule at night.  She denies any back pain associated with this and states pain worsens with walking as well's with laying down.  She denies any fevers, new injuries, new swelling or edema.   External Records Reviewed (Inpatient/Outpatient notes, Prior labs/imaging studies, Care Everywhere, PDMP, External ED notes, etc)  Reviewed previous notes, labs, imaging, Care Everywhere.  Past Medical History:  Diagnosis Date  . Abdominal pain 08/04/2017   con't abd pain, CT negative, tx with protonix , con't carafate , ddx: GERD, gastritis, esophagitis, PUD.  GI referral.  no NSAIDs, tramadol  for pain    . Diabetes mellitus (CMS-HCC)   . Difficult or  painful urination 07/08/2017  . History of diabetes mellitus 09/15/2022  . Left shoulder pain 09/15/2022   Steroid injection with films performed January 2008.    . Leg edema 09/15/2022   intermittent, previously followed by Podiatry    . Swelling of right foot 07/08/2017    Patient Active Problem List  Diagnosis  . Adrenal mass, left (CMS-HCC)  . Atopic dermatitis  . Barrett's esophagus without dysplasia  . Benign essential  hypertension  . Chronic diarrhea  . Chronic painful diabetic neuropathy (CMS-HCC)  . Elevated lipase  . Type 2 diabetes mellitus (CMS-HCC)  . GERD without esophagitis  . Hypertension  . Status post cholecystectomy  . Lumbar disc disease  . Mixed hyperlipidemia  . Proximal limb muscle weakness  . Schatzki's ring  . Severe obesity (BMI 35.0-35.9 with comorbidity) (CMS-HCC)  . Uncontrolled type 2 diabetes mellitus with hyperglycemia (CMS-HCC)  . Vitamin D  deficiency    No past surgical history on file.  No current facility-administered medications for this encounter.  Current Outpatient Medications:  .  gabapentin  (NEURONTIN ) 600 MG tablet, Take 1 tablet (600 mg total) by mouth., Disp: , Rfl:  .  glipiZIDE  (GLUCOTROL  XL) 5 MG 24 hr tablet, Take 1 tablet (5 mg total) by mouth., Disp: , Rfl:  .  carvedilol (COREG) 3.125 MG tablet, Take 1 tablet (3.125 mg total) by mouth., Disp: , Rfl:  .  gabapentin  (NEURONTIN ) 300 MG capsule, Take 1 capsule (300 mg total) by mouth., Disp: , Rfl:  .  lisinopril  (PRINIVIL ,ZESTRIL ) 40 MG tablet, Take 1 tablet (40 mg total) by mouth daily., Disp: , Rfl:   Allergies Dulaglutide, Insulins, Pregabalin , Duloxetine , Sitagliptin , and Hydrocodone   History reviewed. No pertinent family history.  Social History Social History   Tobacco Use  . Smoking status: Never  . Smokeless tobacco: Current    Types: Chew      Physical Exam   ED Triage Vitals  Enc Vitals Group     BP 09/15/22 1340 176/73     Heart Rate 09/15/22 1327 87     SpO2 Pulse --      Resp 09/15/22 1340 18     Temp 09/15/22 1340 36.9 C (98.4 F)     Temp Source 09/15/22 1340 Oral     SpO2 09/15/22 1327 98 %     Weight 09/15/22 1342 (!) 101.2 kg (223 lb)     Height --      Head Circumference --      Peak Flow --      Pain Score --      Pain Loc --      Pain Education --      Exclude from Growth Chart --     Constitutional: Alert and oriented. Well appearing and in no  distress. Cardiovascular: Normal rate.  Dorsalis pedis pulses intact bilaterally however weak with inability to palpate posterior pulses.  Capillary refill 4 to 5 seconds in all toes. Respiratory: Normal respiratory effort.  Gastrointestinal: Soft and nontender. There is no CVA tenderness. Genitourinary: Symptoms HPI encounter for exam. Musculoskeletal: Normal range of motion in all extremities.  Bilateral lower extremity edema with +1 pitting not noted to be new, bilateral lower extremity strength 5/5 at hip, knee, ankle.  No midline spinal tenderness on exam. Neurologic: Normal speech and language. No gross focal neurologic deficits are appreciated. Skin: Skin is warm, dry and intact. No rash noted. Psychiatric: Mood and affect are normal. Speech and behavior are normal.    Radiology  PVL ABI            Lennie Donnice [place], [place] 09/15/22 [phone]

## 2023-04-02 ENCOUNTER — Ambulatory Visit
Admission: RE | Admit: 2023-04-02 | Discharge: 2023-04-02 | Disposition: A | Discharge status: Home / Self Care | Source: Ambulatory Visit | Attending: Registered Nurse | Admitting: Registered Nurse

## 2023-04-02 ENCOUNTER — Other Ambulatory Visit: Payer: Self-pay | Admitting: Registered Nurse

## 2023-04-02 DIAGNOSIS — R109 Unspecified abdominal pain: Secondary | ICD-10-CM

## 2023-12-20 ENCOUNTER — Other Ambulatory Visit: Payer: Self-pay | Admitting: Nephrology

## 2023-12-20 ENCOUNTER — Encounter: Payer: Self-pay | Admitting: Nephrology

## 2023-12-20 DIAGNOSIS — N1831 Chronic kidney disease, stage 3a: Secondary | ICD-10-CM

## 2023-12-20 DIAGNOSIS — R634 Abnormal weight loss: Secondary | ICD-10-CM

## 2024-01-10 ENCOUNTER — Inpatient Hospital Stay: Admission: RE | Admit: 2024-01-10 | Discharge: 2024-01-10 | Attending: Nephrology | Admitting: Nephrology

## 2024-01-10 DIAGNOSIS — N1831 Chronic kidney disease, stage 3a: Secondary | ICD-10-CM

## 2024-01-10 DIAGNOSIS — R634 Abnormal weight loss: Secondary | ICD-10-CM
